# Patient Record
Sex: Male | Born: 1945 | Race: White | Hispanic: No | Marital: Married
Health system: Midwestern US, Community
[De-identification: ages and names within clinical notes are randomized; demographics above are authoritative.]

## PROBLEM LIST (undated history)

## (undated) DIAGNOSIS — E785 Hyperlipidemia, unspecified: Secondary | ICD-10-CM

## (undated) DIAGNOSIS — I428 Other cardiomyopathies: Secondary | ICD-10-CM

## (undated) DIAGNOSIS — I10 Essential (primary) hypertension: Secondary | ICD-10-CM

## (undated) DIAGNOSIS — I2510 Atherosclerotic heart disease of native coronary artery without angina pectoris: Secondary | ICD-10-CM

## (undated) DIAGNOSIS — R55 Syncope and collapse: Secondary | ICD-10-CM

## (undated) DIAGNOSIS — Z95810 Presence of automatic (implantable) cardiac defibrillator: Secondary | ICD-10-CM

## (undated) DIAGNOSIS — T8489XA Other specified complication of internal orthopedic prosthetic devices, implants and grafts, initial encounter: Secondary | ICD-10-CM

## (undated) DIAGNOSIS — M5416 Radiculopathy, lumbar region: Secondary | ICD-10-CM

## (undated) HISTORY — DX: Syncope and collapse: R55

## (undated) HISTORY — DX: Atherosclerotic heart disease of native coronary artery without angina pectoris: I25.10

## (undated) HISTORY — PX: CORONARY ARTERY BYPASS GRAFT: SHX141

## (undated) HISTORY — PX: KNEE ARTHROPLASTY: SHX992

## (undated) HISTORY — DX: Hyperlipidemia, unspecified: E78.5

## (undated) HISTORY — DX: Other cardiomyopathies: I42.8

## (undated) HISTORY — DX: Essential (primary) hypertension: I10

## (undated) HISTORY — DX: Presence of automatic (implantable) cardiac defibrillator: Z95.810

## (undated) HISTORY — PX: CORONARY STENT PLACEMENT: SHX1402

---

## 2005-11-02 ENCOUNTER — Emergency Department (HOSPITAL_COMMUNITY): Admission: EM | Admit: 2005-11-02 | Discharge: 2005-11-02 | Payer: Self-pay | Admitting: Emergency Medicine

## 2007-08-22 IMAGING — CR DG CHEST 2V
2 series · 2 of 2 positions shown · non-contrast
Comparison: none

HISTORY: Syncope, past history cardiac stenting

CHEST 2 VIEWS:
No prior study for comparison
Upper normal heart size.
Normal mediastinal contours and pulmonary vascularity.
Lungs clear.
No pleural effusion or pneumothorax.
Minimal spur formation thoracic spine.

[view not recorded (1 of 2)]
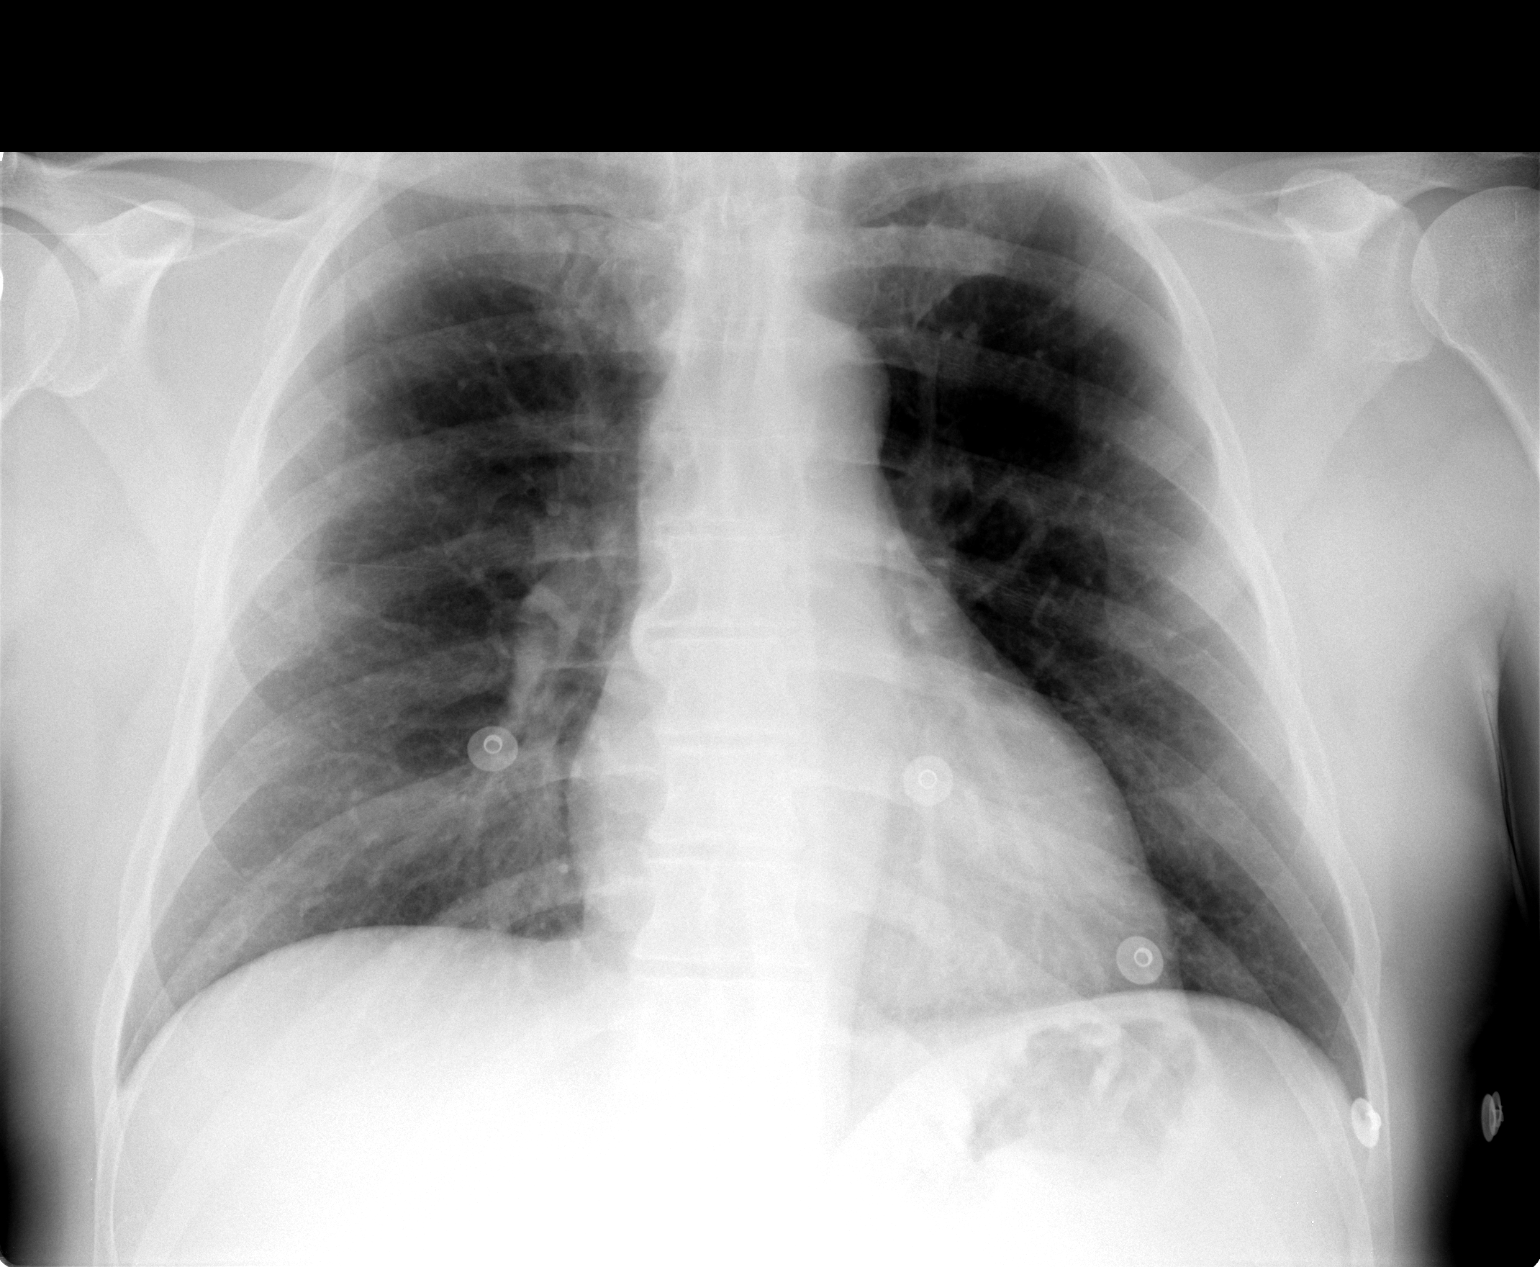

[view not recorded (2 of 2)]
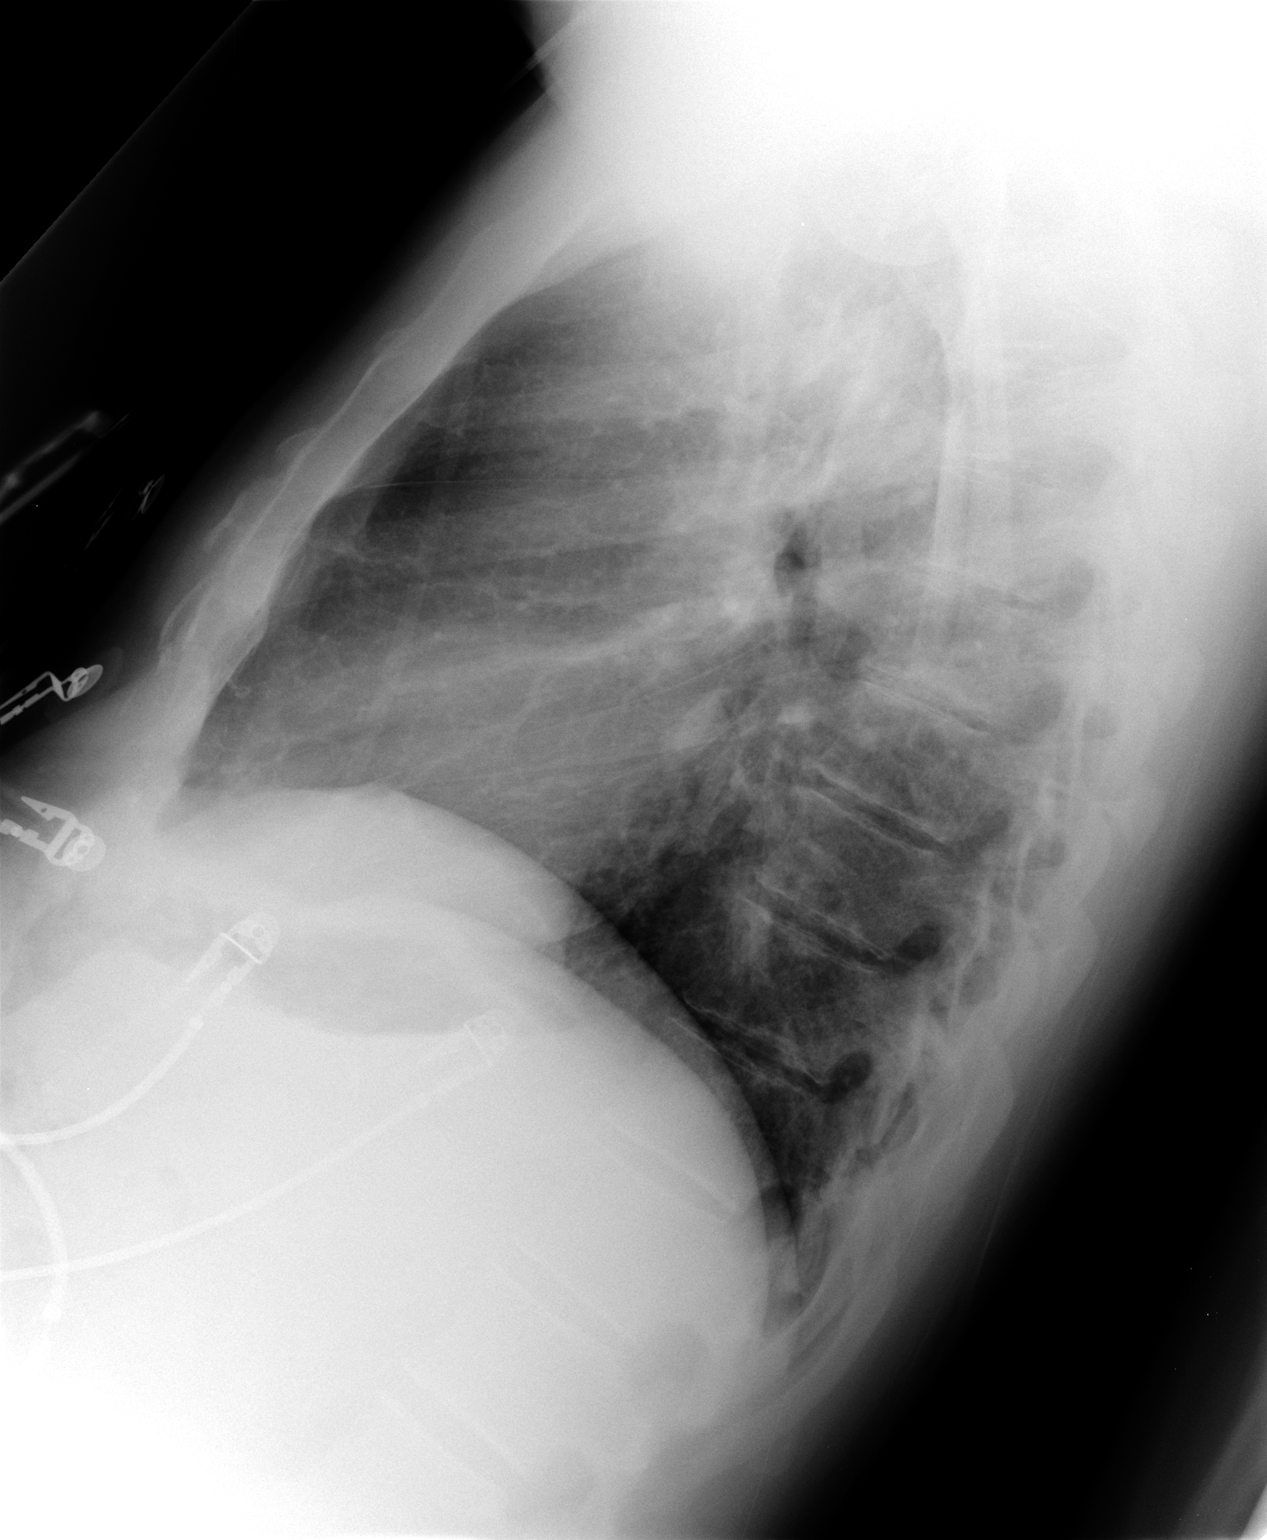

[2 of 2 positions shown; findings below may reference images not displayed]

IMPRESSION: No acute abnormalities.

## 2011-11-14 ENCOUNTER — Encounter: Payer: Self-pay | Admitting: *Deleted

## 2011-11-17 ENCOUNTER — Encounter: Payer: Self-pay | Admitting: *Deleted

## 2011-11-17 ENCOUNTER — Ambulatory Visit (INDEPENDENT_AMBULATORY_CARE_PROVIDER_SITE_OTHER): Payer: Medicare Other | Admitting: Internal Medicine

## 2011-11-17 ENCOUNTER — Encounter: Payer: Self-pay | Admitting: Internal Medicine

## 2011-11-17 VITALS — BP 132/80 | HR 64 | Ht 71.0 in | Wt 202.0 lb

## 2011-11-17 DIAGNOSIS — I2510 Atherosclerotic heart disease of native coronary artery without angina pectoris: Secondary | ICD-10-CM

## 2011-11-17 DIAGNOSIS — R55 Syncope and collapse: Secondary | ICD-10-CM

## 2011-11-17 DIAGNOSIS — Z95810 Presence of automatic (implantable) cardiac defibrillator: Secondary | ICD-10-CM

## 2011-11-17 DIAGNOSIS — I25709 Atherosclerosis of coronary artery bypass graft(s), unspecified, with unspecified angina pectoris: Secondary | ICD-10-CM | POA: Insufficient documentation

## 2011-11-17 DIAGNOSIS — E119 Type 2 diabetes mellitus without complications: Secondary | ICD-10-CM

## 2011-11-17 LAB — ICD DEVICE OBSERVATION
AL THRESHOLD: 0.5 V
BAMS-0003: 70 {beats}/min
LV LEAD IMPEDENCE ICD: 1362.5 Ohm
MODE SWITCH EPISODES: 0
RV LEAD AMPLITUDE: 11.7 mv
RV LEAD THRESHOLD: 0.5 V
TOT-0007: 0
TOT-0008: 0
TOT-0010: 8
VENTRICULAR PACING ICD: 99.82 pct

## 2011-11-17 NOTE — Assessment & Plan Note (Signed)
No recurrent syncope 

## 2011-11-17 NOTE — Assessment & Plan Note (Signed)
Have encouraged him to work on weight loss.

## 2011-11-17 NOTE — Assessment & Plan Note (Signed)
Stable. I've asked him to discuss with Dr. Katrinka Blazing as to whether a lower dose of aspirin might be appropriate

## 2011-11-17 NOTE — Progress Notes (Signed)
CARDIOLOGY CONSULT NOTE  Patient ID: Todd [name], MRN: [id number], DOB/AGE: [birth year] 67 y.o. Admit date: (Not on file) Date of Consult: 11/17/2011  Primary Physician: Katy Apo, MD, MD Primary Cardiologist: HS  Chief Complaint:  New ICD   HPI Todd [name] is a 66 y.o. male : Seen to establish ICD followup.  His story is unusual. He had an episode of syncope in 2002 that was probably vasovagal. He recounts undergoing catheterization at which a 60% lesion was found and a stent was placed. 2007 he underwent stress testing that he was told was "a false positive" subsequently underwent single-vessel CABG because the artery at the site of the stent was "kink" and could not be repaired. In 2010 he underwent CRT implantation, not because he needed to go because he travels. He had left bundle branch block. He does not know his ejection fraction.  He denies exercise intolerance but he does say that he got better following CRT implantation. He's had no recurrent syncope. He has had no ICD discharges.    Past Medical History  Diagnosis Date  . Syncope, vasovagal   . Coronary artery disease   . Diabetes mellitus   . Hyperlipidemia       Surgical History:  Past Surgical History  Procedure Date  . Coronary stent placement      Home Meds: Prior to Admission medications   Medication Sig Start Date End Date Taking? Authorizing Provider  aspirin 325 MG EC tablet Take 325 mg by mouth daily.   Yes Historical Provider, MD  carvedilol (COREG) 25 MG tablet Take 25 mg by mouth 2 (two) times daily with a meal.   Yes Historical Provider, MD  cyanocobalamin 2000 MCG tablet Take 2,000 mcg by mouth daily.   Yes Historical Provider, MD  esomeprazole (NEXIUM) 40 MG capsule Take 40 mg by mouth daily before breakfast.   Yes Historical Provider, MD  fenofibrate (TRICOR) 145 MG tablet Take 145 mg by mouth daily.   Yes Historical Provider, MD  FOLIC ACID PO Take 1 tablet by mouth daily.   Yes  Historical Provider, MD  levothyroxine (SYNTHROID, LEVOTHROID) 50 MCG tablet Take 50 mcg by mouth daily.   Yes Historical Provider, MD  metFORMIN (GLUCOPHAGE) 1000 MG tablet Take 1,000 mg by mouth 2 (two) times daily with a meal.   Yes Historical Provider, MD  Multiple Vitamin (MULTIVITAMIN) capsule Take 1 capsule by mouth daily.   Yes Historical Provider, MD  Niacin-Simvastatin (SIMCOR PO) Take 1 tablet by mouth daily.   Yes Historical Provider, MD  Omega-3 Fatty Acids (FISH OIL PO) Take 1 tablet by mouth daily.   Yes Historical Provider, MD  sitaGLIPtin (JANUVIA) 100 MG tablet Take 100 mg by mouth daily.   Yes Historical Provider, MD  valsartan (DIOVAN) 80 MG tablet Take 80 mg by mouth daily.   Yes Historical Provider, MD     Allergies:  Allergies  Allergen Reactions  . Shellfish Allergy     History   Social History  . Marital Status: Married    Spouse Name: N/A    Number of Children: N/A  . Years of Education: N/A   Occupational History  . Not on file.   Social History Main Topics  . Smoking status: Not on file  . Smokeless tobacco: Not on file  . Alcohol Use: Not on file  . Drug Use: Not on file  . Sexually Active: Not on file   Other Topics Concern  . Not on  file   Social History Narrative  . No narrative on file     No family history on file.   ROS:  Please see the history of present illness.     All other systems reviewed and negative.    Physical Exam:   There were no vitals taken for this visit.  General: Well developed, well nourished male in no acute distress. Head: Normocephalic, atraumatic, sclera non-icteric, no xanthomas, nares are without discharge. Lymph Nodes:  none Neck: Negative for carotid bruits. JVD not elevated. Lungs: Clear bilaterally to auscultation without wheezes, rales, or rhonchi. Breathing is unlabored. Heart: RRR with S1 S2. No murmurs, rubs, or gallops appreciated. Abdomen: Soft, non-tender, non-distended with normoactive  bowel sounds. No hepatomegaly. No rebound/guarding. No obvious abdominal masses. Msk:  Strength and tone appear normal for age. Extremities: No clubbing or cyanosis. No edema.  Distal pedal pulses are 2+ and equal bilaterally. Skin: Warm and Dry Neuro: Alert and oriented X 3. CN III-XII intact Grossly normal sensory and motor function . Psych:  Responds to questions appropriately with a normal affect.      Labs:   Radiology/Studies:  No results found.  EKG: P synchronous pacing   Assessment and Plan:   Sherryl Manges

## 2011-11-17 NOTE — Assessment & Plan Note (Signed)
The patient has an ICD implanted for primary prevention. We'll reprogram the device today to lengthen the intervals to detect for his lung zone device.  It is also noteworthy that his AV delay is programmed at 110 and his intrinsic conduction is at 190. However, in the absence of symptoms, I think it is not necessary to reprogram this.

## 2011-11-17 NOTE — Patient Instructions (Signed)

## 2012-02-19 ENCOUNTER — Encounter: Payer: Medicare Other | Admitting: *Deleted

## 2012-02-24 ENCOUNTER — Encounter: Payer: Self-pay | Admitting: *Deleted

## 2012-04-07 ENCOUNTER — Telehealth: Payer: Self-pay | Admitting: Internal Medicine

## 2012-04-07 NOTE — Telephone Encounter (Signed)
Pt would like to know if we are getting readings from his merlin?

## 2012-04-07 NOTE — Telephone Encounter (Signed)
Spoke with patient, he will attempt to [employer] and transmit tonight.  I will check in the am and call him for an update.

## 2012-04-13 ENCOUNTER — Telehealth: Payer: Self-pay | Admitting: Internal Medicine

## 2012-04-13 NOTE — Telephone Encounter (Signed)
New Problem: ° ° ° ° °Called in to set up his merlin account.  Please call back. °

## 2012-04-13 NOTE — Telephone Encounter (Signed)
Spoke with patient, [employer] is going to come out to his house and check out the line.  The patient will call us when the problem is resolved and we can continue with Merlin transmissions.

## 2012-04-15 ENCOUNTER — Ambulatory Visit (INDEPENDENT_AMBULATORY_CARE_PROVIDER_SITE_OTHER): Payer: Medicare Other | Admitting: *Deleted

## 2012-04-15 ENCOUNTER — Encounter: Payer: Self-pay | Admitting: Internal Medicine

## 2012-04-15 ENCOUNTER — Encounter: Payer: Medicare Other | Admitting: *Deleted

## 2012-04-15 DIAGNOSIS — I428 Other cardiomyopathies: Secondary | ICD-10-CM

## 2012-04-15 DIAGNOSIS — Z95810 Presence of automatic (implantable) cardiac defibrillator: Secondary | ICD-10-CM

## 2012-04-19 LAB — REMOTE ICD DEVICE
AL IMPEDENCE ICD: 490 Ohm
ATRIAL PACING ICD: 2.5 pct
BAMS-0001: 180 {beats}/min
LV LEAD IMPEDENCE ICD: 1275 Ohm
RV LEAD AMPLITUDE: 11.7 mv
RV LEAD IMPEDENCE ICD: 440 Ohm
RV LEAD THRESHOLD: 0.5 V
VENTRICULAR PACING ICD: 100 pct

## 2012-06-15 ENCOUNTER — Encounter: Payer: Self-pay | Admitting: *Deleted

## 2012-07-26 ENCOUNTER — Ambulatory Visit (INDEPENDENT_AMBULATORY_CARE_PROVIDER_SITE_OTHER): Payer: Medicare Other | Admitting: *Deleted

## 2012-07-26 ENCOUNTER — Other Ambulatory Visit: Payer: Self-pay

## 2012-07-26 ENCOUNTER — Encounter: Payer: Self-pay | Admitting: Internal Medicine

## 2012-07-26 DIAGNOSIS — Z95810 Presence of automatic (implantable) cardiac defibrillator: Secondary | ICD-10-CM

## 2012-07-26 DIAGNOSIS — I2510 Atherosclerotic heart disease of native coronary artery without angina pectoris: Secondary | ICD-10-CM

## 2012-07-26 LAB — REMOTE ICD DEVICE
AL THRESHOLD: 0.375 V
BAMS-0003: 70 {beats}/min
HV IMPEDENCE: 47 Ohm
RV LEAD AMPLITUDE: 11.7 mv
RV LEAD IMPEDENCE ICD: 440 Ohm
RV LEAD THRESHOLD: 0.5 V

## 2012-07-28 ENCOUNTER — Telehealth: Payer: Self-pay | Admitting: *Deleted

## 2012-07-28 NOTE — Telephone Encounter (Signed)
Patient came by for appt that had been rescheduled. Routed to [employer].

## 2012-07-28 NOTE — Telephone Encounter (Signed)
Will forward to device clinic. It looks like the patient was supposed to have a remote check on 2/17. I don't know for sure if this was done. Will forward to device to confirm.

## 2012-07-29 NOTE — Telephone Encounter (Signed)
Spoke w/pt and transmission was received on 07-26-12. Pt aware and will wait for letter in mail.

## 2012-08-03 ENCOUNTER — Encounter: Payer: Self-pay | Admitting: *Deleted

## 2012-11-09 ENCOUNTER — Encounter: Payer: Self-pay | Admitting: Internal Medicine

## 2012-11-09 ENCOUNTER — Ambulatory Visit (INDEPENDENT_AMBULATORY_CARE_PROVIDER_SITE_OTHER): Payer: Medicare Other | Admitting: Internal Medicine

## 2012-11-09 VITALS — BP 166/97 | HR 80 | Ht 70.0 in | Wt 203.0 lb

## 2012-11-09 DIAGNOSIS — I428 Other cardiomyopathies: Secondary | ICD-10-CM

## 2012-11-09 DIAGNOSIS — Z95810 Presence of automatic (implantable) cardiac defibrillator: Secondary | ICD-10-CM

## 2012-11-09 DIAGNOSIS — I10 Essential (primary) hypertension: Secondary | ICD-10-CM

## 2012-11-09 DIAGNOSIS — I2510 Atherosclerotic heart disease of native coronary artery without angina pectoris: Secondary | ICD-10-CM

## 2012-11-09 LAB — ICD DEVICE OBSERVATION
AL IMPEDENCE ICD: 475 Ohm
ATRIAL PACING ICD: 2.5 pct
BAMS-0001: 180 {beats}/min
BAMS-0003: 70 {beats}/min
FVT: 0
MODE SWITCH EPISODES: 0
PACEART VT: 0
TOT-0006: 20101005000000
TOT-0007: 0
TOT-0010: 11
VENTRICULAR PACING ICD: 99.56 pct

## 2012-11-09 NOTE — Progress Notes (Signed)
Patient Care Team: Katy Apo, MD as PCP - General (Internal Medicine)   HPI  Todd [name] is a 67 y.o. male Seen in followup for ICD implantation w hx of syncope ? Vasovagal with Cath 60% lesion and stent>>1v CAB 2/2 kinking Ultimately he got CRT  He has some discomfort in his defibrillator site. There is no redness. He is noted more with recent change in his golf swing. He has an echo pending   Past Medical History  Diagnosis Date  . Syncope, vasovagal   . Coronary artery disease     Status post CABG  . Diabetes mellitus   . Hyperlipidemia   . ICD-St.Jude-CRT     Implanted 2010    Past Surgical History  Procedure Laterality Date  . Coronary stent placement      Current Outpatient Prescriptions  Medication Sig Dispense Refill  . aspirin 325 MG EC tablet Take 325 mg by mouth daily.      . carvedilol (COREG) 25 MG tablet Take 25 mg by mouth 2 (two) times daily with a meal.      . cyanocobalamin 2000 MCG tablet Take 2,000 mcg by mouth daily.      Marland Kitchen esomeprazole (NEXIUM) 40 MG capsule Take 40 mg by mouth daily before breakfast.      . fenofibrate (TRICOR) 145 MG tablet Take 145 mg by mouth daily.      Marland Kitchen FOLIC ACID PO Take 1 tablet by mouth daily.      Marland Kitchen levothyroxine (SYNTHROID, LEVOTHROID) 50 MCG tablet Take 50 mcg by mouth daily.      . metFORMIN (GLUCOPHAGE) 1000 MG tablet Take 1,000 mg by mouth 2 (two) times daily with a meal.      . Multiple Vitamin (MULTIVITAMIN) capsule Take 1 capsule by mouth daily.      . Niacin-Simvastatin (SIMCOR PO) Take 1 tablet by mouth daily.      . Omega-3 Fatty Acids (FISH OIL PO) Take 1 tablet by mouth daily.      . sitaGLIPtin (JANUVIA) 100 MG tablet Take 100 mg by mouth daily.      . valsartan (DIOVAN) 80 MG tablet Take 80 mg by mouth daily.       No current facility-administered medications for this visit.    Allergies  Allergen Reactions  . Shellfish Allergy     Review of Systems negative except from HPI and  PMH  Physical Exam BP 166/97  Pulse 80  Ht 5\' 10"  (1.778 m)  Wt 203 lb (92.08 kg)  BMI 29.13 kg/m2  Well developed and well nourished in no acute distress HENT normal E scleral and icterus clear Neck Supple JVP flat; carotids brisk and full Clear to ausculation  Regular rate and rhythm, no murmurs gallops or rub Soft with active bowel sounds No clubbing cyanosis none Edema Alert and oriented, grossly normal motor and sensory function Skin Warm and Dry  .psps P-synchronous/ AV  pacing   Assessment and  Plan

## 2012-11-09 NOTE — Assessment & Plan Note (Signed)
Elevated  Will defer to Dr Luretha Murphy

## 2012-11-09 NOTE — Assessment & Plan Note (Signed)
Will decrease asa to 81 mg

## 2012-11-09 NOTE — Patient Instructions (Addendum)
Remote monitoring is used to monitor your Pacemaker of ICD from home. This monitoring reduces the number of office visits required to check your device to one time per year. It allows [place] to keep an eye on the functioning of your device to ensure it is working properly. You are scheduled for a device check from home on 02/14/13. You may send your transmission at any time that day. If you have a wireless device, the transmission will be sent automatically. After your physician reviews your transmission, you will receive a postcard with your next transmission date.  Your physician wants you to follow-up in: 1 year with Dr. Graciela Husbands. You will receive a reminder letter in the mail two months in advance. If you don't receive a letter, please call our office to schedule the follow-up appointment.  Your physician recommends that you continue on your current medications as directed. Please refer to the Current Medication list given to you today.

## 2012-11-09 NOTE — Assessment & Plan Note (Signed)
The patient's device was interrogated.  The information was reviewed. Output on LV lead were evaluated and reprogrammed

## 2013-02-14 ENCOUNTER — Ambulatory Visit (INDEPENDENT_AMBULATORY_CARE_PROVIDER_SITE_OTHER): Payer: Medicare Other | Admitting: *Deleted

## 2013-02-14 DIAGNOSIS — I428 Other cardiomyopathies: Secondary | ICD-10-CM

## 2013-02-15 LAB — REMOTE ICD DEVICE
AL AMPLITUDE: 2.6 mv
AL IMPEDENCE ICD: 480 Ohm
AL THRESHOLD: 0.375 V
DEVICE MODEL ICD: 594846

## 2013-03-14 ENCOUNTER — Encounter: Payer: Self-pay | Admitting: *Deleted

## 2013-04-18 ENCOUNTER — Encounter: Payer: Self-pay | Admitting: Internal Medicine

## 2013-05-16 ENCOUNTER — Encounter: Payer: Medicare Other | Admitting: *Deleted

## 2013-05-24 ENCOUNTER — Encounter: Payer: Self-pay | Admitting: *Deleted

## 2013-11-21 ENCOUNTER — Encounter: Payer: Self-pay | Admitting: *Deleted

## 2013-11-29 ENCOUNTER — Ambulatory Visit: Payer: Medicare Other | Admitting: Interventional Cardiology

## 2015-01-26 ENCOUNTER — Encounter

## 2015-02-01 NOTE — Progress Notes (Signed)
Interventional Radiology Pre Assessment Phone Call    Pt scheduled for lumbar myelogram in IR on 8/26. Pt to arrive at 715 and check in at [facility] Check in at the 2nd floor radiology desk.    Plan for Sedation:none    Orders: media tab Labs: n/a   H&P: 8/19    Last IR Visit: n/a  NPO:  Instructed to be well hydrated and have light breakfast    Instructed patient to continue previous medications as prescribed prior to procedure except for herbal supplements and vitamins.   Pt to hold ASA 325mg .  Last dose 8/24.     Patient instructed on the following and verbalized understanding:  Responsible adult must drive patient to and from hospital.  Patient verbalized understanding of all instructions and provided all medical/health information to the best of their ability.

## 2015-02-02 ENCOUNTER — Inpatient Hospital Stay: Admit: 2015-02-02 | Payer: MEDICARE | Attending: Orthopaedic Surgery | Primary: Family Medicine

## 2015-02-02 DIAGNOSIS — M5416 Radiculopathy, lumbar region: Secondary | ICD-10-CM

## 2015-02-02 MED ORDER — ONDANSETRON 4 MG TAB, RAPID DISSOLVE
4 mg | ORAL | Status: AC
Start: 2015-02-02 — End: ?

## 2015-02-02 MED ORDER — ONDANSETRON 4 MG TAB, RAPID DISSOLVE
4 mg | Freq: Once | ORAL | Status: AC
Start: 2015-02-02 — End: 2015-02-02
  Administered 2015-02-02: 14:00:00 via ORAL

## 2015-02-02 MED ORDER — SODIUM BICARBONATE 4 % IV
4 % | Freq: Once | INTRAVENOUS | Status: AC
Start: 2015-02-02 — End: 2015-02-02
  Administered 2015-02-02: 13:00:00 via SUBCUTANEOUS

## 2015-02-02 MED ORDER — DIPHENHYDRAMINE 50 MG CAP
50 mg | ORAL | Status: AC
Start: 2015-02-02 — End: ?

## 2015-02-02 MED ORDER — LIDOCAINE HCL 2 % (20 MG/ML) IJ SOLN
20 mg/mL (2 %) | Freq: Once | INTRAMUSCULAR | Status: AC
Start: 2015-02-02 — End: 2015-02-02
  Administered 2015-02-02: 13:00:00 via SUBCUTANEOUS

## 2015-02-02 MED ORDER — SODIUM BICARBONATE 4 % IV
4 % | INTRAVENOUS | Status: AC
Start: 2015-02-02 — End: ?

## 2015-02-02 MED ORDER — LIDOCAINE HCL 2 % (20 MG/ML) IJ SOLN
20 mg/mL (2 %) | INTRAMUSCULAR | Status: AC
Start: 2015-02-02 — End: ?

## 2015-02-02 MED ORDER — DIPHENHYDRAMINE 50 MG CAP
50 mg | Freq: Once | ORAL | Status: AC
Start: 2015-02-02 — End: 2015-02-02
  Administered 2015-02-02: 12:00:00 via ORAL

## 2015-02-02 MED ORDER — IOPAMIDOL 41 % INTRATHECAL
200 mg iodine /mL (41 %) | Freq: Once | INTRATHECAL | Status: AC
Start: 2015-02-02 — End: 2015-02-02
  Administered 2015-02-02: 13:00:00 via INTRASPINAL

## 2015-02-02 MED FILL — DIPHENHYDRAMINE 50 MG CAP: 50 mg | ORAL | Qty: 1

## 2015-02-02 MED FILL — ONDANSETRON 4 MG TAB, RAPID DISSOLVE: 4 mg | ORAL | Qty: 1

## 2015-02-02 MED FILL — NEUT 4 % INTRAVENOUS SOLUTION: 4 % | INTRAVENOUS | Qty: 1

## 2015-02-02 MED FILL — XYLOCAINE 20 MG/ML (2 %) INJECTION SOLUTION: 20 mg/mL (2 %) | INTRAMUSCULAR | Qty: 20

## 2015-02-02 NOTE — Procedures (Signed)
Procedures by Arman Bogusarruthers, Chiffon Kittleson L, PA-C at 02/02/15 [phone]                Author: Arman Bogusarruthers, Ezrie Bunyan L, PA-C  Service: Physician Assistant  Author Type: Physician Assistant       Filed: 02/02/15 0852  Date of Service: 02/02/15 0851  Status: Signed           Editor: Drema Dallasarruthers, Cliff Damiani L, PA-C (Physician Assistant)  Cosigner: Roseanne RenoHarp, Richard J, MD at 02/22/15 1251                    Department of Interventional Radiology   [phone]          Interventional Radiology Brief Procedure Note          Patient: Aaron Floyd  MRN: [id number]   SSN: [id number]          Date of Birth: 06/04/1946   Age: 69 y.o.   Sex: male         Date of Procedure: 02/02/2015      Pre-Procedure Diagnosis: back pain      Post-Procedure Diagnosis: SAME      Procedure(s): Myelogram      Brief Description of Procedure: fluoro guided LP @ L4/5, contrast injected, needle removed      Performed By: Arman BogusKristy L Jamari Moten, PA-C       Assistants: None      Anesthesia:None      Estimated Blood Loss: None      Specimens: None      Implants: None      Findings: See dictated report.       Complications: None      Recommendations: bedrest, CT       Follow Up: Dr Marlyne BeardsJennings         Signed By:  Arman BogusKristy L Elysa Womac, PA-C           February 02, 2015

## 2015-02-02 NOTE — H&P (Signed)
Department of Interventional Radiology  [phone]    History and Physical    Patient:  Aaron Floyd MRN:  [id number]  SSN:  [id number]    Date of Birth:  Jun 15, 1945  Age:  69 y.o.  Sex:  male      Primary Care Provider:  Mariane Baumgarten, MD  Referring Physician:  Kathee Polite., *    Subjective:     Chief Complaint: back pain    History of the Present Illness:  The patient is a 69 y.o. male who presents for lumbar myelogram.  Low back pain radiates LLE to ankle.  Shellfish allergy, no known contrast allergy.        Past Medical History   Diagnosis Date   ??? Diabetes (HCC)    ??? GERD (gastroesophageal reflux disease)    ??? Hypertension    ??? Thyroid disease      hypothyroidism     Past Surgical History   Procedure Laterality Date   ??? Hx pacemaker  2010     st jude /defibrillator/pacemaker   ??? Hx heent  1950     tonsillectomy   ??? Pr cardiac surg procedure unlist  01/22/2007     cabg    ??? Pr cardiac surg procedure unlist  2002     stent- no longer active after CABG        Review of Systems:    Pertinent items are noted in the History of Present Illness.    Current Outpatient Prescriptions   Medication Sig   ??? aspirin delayed-release 325 mg tablet Take  by mouth every six (6) hours as needed for Pain.   ??? sitaGLIPtin-metFORMIN (JANUMET) 50-1,000 mg per tablet Take 1 Tab by mouth two (2) times daily (with meals).   ??? losartan (COZAAR) 100 mg tablet Take 100 mg by mouth daily.   ??? esomeprazole (NEXIUM) 20 mg capsule Take  by mouth daily.   ??? carvedilol (COREG) 25 mg tablet Take 25 mg by mouth two (2) times daily (with meals).   ??? folic acid (FOLVITE) 1 mg tablet Take  by mouth daily.   ??? levothyroxine (SYNTHROID) 50 mcg tablet Take  by mouth Daily (before breakfast).   ??? atorvastatin (LIPITOR) 40 mg tablet Take  by mouth daily.   ??? cholecalciferol, vitamin D3, (VITAMIN D3) 2,000 unit tab Take  by mouth daily.   ??? omega-3 fatty acids-vitamin e 1,000 mg cap Take 1 Cap by mouth.    ??? multivitamin (ONE A DAY) tablet Take 1 Tab by mouth daily.     Current Facility-Administered Medications   Medication Dose Route Frequency   ??? lidocaine (XYLOCAINE) 20 mg/mL (2 %) injection 20-400 mg  20-400 mg SubCUTAneous ONCE   ??? sodium bicarbonate (NEUT) injection 2 mL  2 mL SubCUTAneous ONCE   ??? iopamidol (ISOVUE-M 200) 41 % contrast solution 20 mL  20 mL IntraSPINal RAD ONCE        Allergies   Allergen Reactions   ??? Shellfish Derived Hives     Hives/ tolerates contrast dye without issues       Family History   Problem Relation Age of Onset   ??? Diabetes Mother    ??? Stroke Mother    ??? Heart Disease Mother    ??? Diabetes Father    ??? Heart Disease Father      Social History   Substance Use Topics   ??? Smoking status: Former Smoker     Quit date: 01/31/1965   ???  Smokeless tobacco: Not on file   ??? Alcohol use No        Objective:       Physical Examination:    Vitals:    02/01/15 1131 02/02/15 0759   BP:  160/86   Pulse:  68   Resp:  17   SpO2:  97%   Weight: 88.5 kg (195 lb) 88.5 kg (195 lb)   Height: 5' 10.5" (1.791 m) 5' 10.5" (1.791 m)     Blood pressure 160/86, pulse 68, resp. rate 17, height 5' 10.5" (1.791 m), weight 88.5 kg (195 lb), SpO2 97 %.  HEART: regular rate and rhythm  LUNG: clear to auscultation bilaterally  ABDOMEN: warm, no edema  EXTREMITIES: warm, no edema    Laboratory:     No results found for: NA, K, CL, CO2, AGAP, GLU, BUN, CREA, GFRAA, GFRNA, CA, MG, PHOS, ALB, TBIL, TP, ALB, GLOB, AGRAT, SGOT, ALT, GPT  No results found for: WBC, HGB, HCT, PLT, HGBEXT, HCTEXT, PLTEXT  No results found for: APTT, PTP, INR    Assessment:     Low back pain    Plan:     Planned Procedure:  Lumbar myelogram    Risks, benefits, and alternatives reviewed with patient and he agrees to proceed with the procedure.      Signed By: Arman Bogus, PA-C     February 02, 2015

## 2015-02-02 NOTE — Progress Notes (Signed)
TRANSFER - IN REPORT:    Verbal report received from [facility],RN (name) on Aaron Floyd  being received from IR (unit) for routine progression of care      Report consisted of patient???s Situation, Background, Assessment and   Recommendations(SBAR).     Information from the following report(s) Procedure Summary and MAR was reviewed with the receiving nurse.    Opportunity for questions and clarification was provided.      Assessment completed upon patient???s arrival to unit and care assumed.

## 2015-02-02 NOTE — Progress Notes (Signed)
Discharge complete. Discharge instructions reviewed with pt. Time for questions allowed. Pt denies any questions at this time. Dressing to back noted to be clean, dry, and intact. Pt assisted to front of hospital via wheelchair.     Visit Vitals   ??? BP 159/87 (BP 1 Location: Right arm, BP Patient Position: At rest)   ??? Pulse 63   ??? Resp 16   ??? Ht 5' 10.5" (1.791 m)   ??? Wt 88.5 kg (195 lb)   ??? SpO2 97%   ??? BMI 27.58 kg/m2

## 2015-02-02 NOTE — Progress Notes (Signed)
Upon assessment pt stated he was nauseous. Gaetano Net PA-C made aware. Verbal order received from K. Carruthers PA-C to administer 4 mg of Zofan, PO, once. Will continue to monitor.

## 2015-02-02 NOTE — Procedures (Signed)
Department of Interventional Radiology  [phone]        Interventional Radiology Brief Procedure Note    Patient: Christoper FabianJames Golberg MRN: [id number]  SSN: [id number]    Date of Birth: 12/27/1945  Age: 69 y.o.  Sex: male      Date of Procedure: 02/02/2015    Pre-Procedure Diagnosis: back pain    Post-Procedure Diagnosis: SAME    Procedure(s): Myelogram    Brief Description of Procedure: fluoro guided LP @ L4/5, contrast injected, needle removed    Performed By: Arman BogusKristy L Mykle Pascua, PA-C     Assistants: None    Anesthesia:None    Estimated Blood Loss: None    Specimens: None    Implants: None    Findings: See dictated report.     Complications: None    Recommendations: bedrest, CT     Follow Up: Dr Marlyne BeardsJennings    Signed By: Arman BogusKristy L Chino Sardo, PA-C     February 02, 2015

## 2015-06-01 ENCOUNTER — Telehealth: Payer: Self-pay | Admitting: Internal Medicine

## 2015-06-01 NOTE — Telephone Encounter (Signed)
Spoke w/ pt- pt stated he is seeing w/ Dr Leonard Schwartz in [place], [place]- pt appreciative of our following up with him.

## 2015-06-26 ENCOUNTER — Inpatient Hospital Stay: Payer: MEDICARE | Primary: Family Medicine

## 2015-06-26 ENCOUNTER — Encounter: Payer: MEDICARE | Primary: Family Medicine

## 2015-06-28 ENCOUNTER — Inpatient Hospital Stay: Admit: 2015-06-28 | Payer: MEDICARE | Attending: Orthopaedic Surgery | Primary: Family Medicine

## 2015-06-28 DIAGNOSIS — Z01812 Encounter for preprocedural laboratory examination: Secondary | ICD-10-CM

## 2015-06-28 LAB — CBC WITH AUTOMATED DIFF
ABS. BASOPHILS: 0 10*3/uL (ref 0.0–0.2)
ABS. EOSINOPHILS: 0.1 10*3/uL (ref 0.0–0.8)
ABS. IMM. GRANS.: 0 10*3/uL (ref 0.0–0.5)
ABS. LYMPHOCYTES: 1.1 10*3/uL (ref 0.5–4.6)
ABS. MONOCYTES: 0.3 10*3/uL (ref 0.1–1.3)
ABS. NEUTROPHILS: 3.4 10*3/uL (ref 1.7–8.2)
BASOPHILS: 0 % (ref 0.0–2.0)
EOSINOPHILS: 2 % (ref 0.5–7.8)
HCT: 38.5 % — ABNORMAL LOW (ref 41.1–50.3)
HGB: 13.3 g/dL — ABNORMAL LOW (ref 13.6–17.2)
IMMATURE GRANULOCYTES: 0.4 % (ref 0.0–5.0)
LYMPHOCYTES: 22 % (ref 13–44)
MCH: 29.5 PG (ref 26.1–32.9)
MCHC: 34.5 g/dL (ref 31.4–35.0)
MCV: 85.4 FL (ref 79.6–97.8)
MONOCYTES: 7 % (ref 4.0–12.0)
MPV: 9.7 FL — ABNORMAL LOW (ref 10.8–14.1)
NEUTROPHILS: 69 % (ref 43–78)
PLATELET: 153 10*3/uL (ref 150–450)
RBC: 4.51 M/uL (ref 4.23–5.67)
RDW: 13.2 % (ref 11.9–14.6)
WBC: 5 10*3/uL (ref 4.3–11.1)

## 2015-06-28 LAB — METABOLIC PANEL, BASIC
Anion gap: 12 mmol/L (ref 7–16)
BUN: 23 MG/DL (ref 8–23)
CO2: 25 mmol/L (ref 21–32)
Calcium: 9.3 MG/DL (ref 8.3–10.4)
Chloride: 102 mmol/L (ref 98–107)
Creatinine: 1.03 MG/DL (ref 0.8–1.5)
GFR est AA: >60 mL/min/{1.73_m2} (ref >60)
GFR est non-AA: >60 mL/min/{1.73_m2} (ref >60)
Glucose: 248 mg/dL — ABNORMAL HIGH (ref 65–100)
Potassium: 4 mmol/L (ref 3.5–5.1)
Sodium: 139 mmol/L (ref 136–145)

## 2015-06-28 LAB — URINALYSIS W/ RFLX MICROSCOPIC
Bilirubin: NEGATIVE
Blood: NEGATIVE
Glucose: >1000 mg/dL
Leukocyte Esterase: NEGATIVE
Nitrites: NEGATIVE
Protein: NEGATIVE mg/dL
Specific gravity: 1.031 — ABNORMAL HIGH (ref 1.001–1.023)
Urobilinogen: 0.2 EU/dL (ref 0.2–1.0)
pH (UA): 5 (ref 5.0–9.0)

## 2015-06-28 LAB — PTT: aPTT: 25.4 s (ref 25.3–32.9)

## 2015-06-28 LAB — PROTHROMBIN TIME + INR
INR: 1 (ref 0.9–1.2)
Prothrombin time: 10.8 s (ref 9.6–12.0)

## 2015-06-28 LAB — MSSA/MRSA SC BY PCR, NASAL SWAB

## 2015-06-28 NOTE — Other (Signed)
Lab results are within limits per anesthesia protocol.

## 2015-06-28 NOTE — Undefined (Signed)
Total Joint Surgery Preoperative Chart Review      Patient ID:  Aaron Floyd  [id number]  70 y.o.  09/06/1945  Surgeon: Dr. Marlyne Beards  Date of Surgery: 07/02/2015  Procedure: Total Left Knee Arthroplasty  Primary Care Physician: Mariane Baumgarten, MD [phone]  Specialty Physician(s):  [facility]    Subjective:   Aaron Floyd is a 70 y.o. WHITE OR CAUCASIAN male who presents for preoperative evaluation for Total Left Knee arthroplasty.    This is a preoperative chart review note based on data collected by the nurse at the surgical Pre-Assessment visit.    Past Medical History   Diagnosis Date   ??? Arrhythmia      pacemaker   ??? CAD (coronary artery disease)      CABG, stent   ??? Diabetes (HCC)      pt reports most recent  A1C 6.6   ??? GERD (gastroesophageal reflux disease)    ??? Hypertension    ??? Ischemic cardiomyopathy      hx per cardiologist note 05-11-15   ??? Sepsis [facility])      June 2016   ??? Sleep apnea      c-pap   ??? Thyroid disease      hypothyroidism      Past Surgical History   Procedure Laterality Date   ??? Pr cardiac surg procedure unlist  01/22/2007     cabg    ??? Pr cardiac surg procedure unlist  2002     stent- no longer active after CABG   ??? Hx pacemaker  2010     st jude /defibrillator/pacemaker   ??? Hx heent  1950     tonsillectomy     Family History   Problem Relation Age of Onset   ??? Diabetes Mother    ??? Stroke Mother    ??? Heart Disease Mother    ??? Diabetes Father    ??? Heart Disease Father       Social History   Substance Use Topics   ??? Smoking status: Former Smoker     Quit date: 01/31/1965   ??? Smokeless tobacco: Not on file   ??? Alcohol use No       Prior to Admission medications    Medication Sig Start Date End Date Taking? Authorizing Provider   aspirin delayed-release 81 mg tablet Take 81 mg by mouth daily. Take morning of surgery per anesthesia guidelines.   Yes Historical Provider   aspirin delayed-release 325 mg tablet Take  by mouth every six (6) hours  as needed for Pain. Stop 5 days prior to surgery per anesthesia guidelines.    Historical Provider   sitaGLIPtin-metFORMIN (JANUMET) 50-1,000 mg per tablet Take 1 Tab by mouth two (2) times daily (with meals).    Historical Provider   losartan (COZAAR) 100 mg tablet Take 100 mg by mouth daily.    Historical Provider   esomeprazole (NEXIUM) 20 mg capsule Take  by mouth daily. Take morning of surgery per anesthesia guidelines.    Historical Provider   carvedilol (COREG) 25 mg tablet Take 25 mg by mouth two (2) times daily (with meals). Take morning of surgery per anesthesia guidelines.    Historical Provider   folic acid (FOLVITE) 1 mg tablet Take  by mouth daily.    Historical Provider   levothyroxine (SYNTHROID) 50 mcg tablet Take  by mouth Daily (before breakfast). Take morning of surgery per anesthesia guidelines.    Historical Provider   atorvastatin (LIPITOR) 40  mg tablet Take  by mouth nightly.    Historical Provider   cholecalciferol, vitamin D3, (VITAMIN D3) 2,000 unit tab Take  by mouth daily.    Historical Provider   omega-3 fatty acids-vitamin e 1,000 mg cap Take 1 Cap by mouth.    Historical Provider   multivitamin (ONE A DAY) tablet Take 1 Tab by mouth daily.    Historical Provider     Allergies   Allergen Reactions   ??? Shellfish Derived Hives     Hives/ tolerates contrast dye without issues          Objective:     Physical Exam:   Patient Vitals for the past 24 hrs:   Temp Pulse Resp BP SpO2   06/28/15 0845 97.1 ??F (36.2 ??C) 67 18 160/79 98 %       ECG:    EKG Results     None          Data Review:   Labs:   Recent Results (from the past 24 hour(s))   CBC WITH AUTOMATED DIFF    Collection Time: 06/28/15  9:30 AM   Result Value Ref Range    WBC 5.0 4.3 - 11.1 K/uL    RBC 4.51 4.23 - 5.67 M/uL    HGB 13.3 (L) 13.6 - 17.2 g/dL    HCT 16.1 (L) 09.6 - 50.3 %    MCV 85.4 79.6 - 97.8 FL    MCH 29.5 26.1 - 32.9 PG    MCHC 34.5 31.4 - 35.0 g/dL    RDW 04.5 40.9 - 81.1 %    PLATELET 153 150 - 450 K/uL     MPV 9.7 (L) 10.8 - 14.1 FL    DF AUTOMATED      NEUTROPHILS 69 43 - 78 %    LYMPHOCYTES 22 13 - 44 %    MONOCYTES 7 4.0 - 12.0 %    EOSINOPHILS 2 0.5 - 7.8 %    BASOPHILS 0 0.0 - 2.0 %    IMMATURE GRANULOCYTES 0.4 0.0 - 5.0 %    ABS. NEUTROPHILS 3.4 1.7 - 8.2 K/UL    ABS. LYMPHOCYTES 1.1 0.5 - 4.6 K/UL    ABS. MONOCYTES 0.3 0.1 - 1.3 K/UL    ABS. EOSINOPHILS 0.1 0.0 - 0.8 K/UL    ABS. BASOPHILS 0.0 0.0 - 0.2 K/UL    ABS. IMM. GRANS. 0.0 0.0 - 0.5 K/UL   METABOLIC PANEL, BASIC    Collection Time: 06/28/15  9:30 AM   Result Value Ref Range    Sodium 139 136 - 145 mmol/L    Potassium 4.0 3.5 - 5.1 mmol/L    Chloride 102 98 - 107 mmol/L    CO2 25 21 - 32 mmol/L    Anion gap 12 7 - 16 mmol/L    Glucose 248 (H) 65 - 100 mg/dL    BUN 23 8 - 23 MG/DL    Creatinine 9.14 0.8 - 1.5 MG/DL    GFR est AA >78 >29 FA/OZH/0.86V7    GFR est non-AA >60 >60 ml/min/1.39m2    Calcium 9.3 8.3 - 10.4 MG/DL         Total Joint Surgery Pre-Assessment Recommendations:          The patient is to wear home CPAP during hospitalization.        Signed By: Robie Ridge, NP-C    June 28, 2015

## 2015-06-28 NOTE — Other (Addendum)
Patient verified name, DOB, and surgery as listed in [facility].    Problem chart:  1-pt has echo scheduled with [facility] 06-29-15 at 8:00.  2 cardiology clearance requested from Dr Orson Aloe by surgeon's office  3-requested cardiology records from [facility] recent ekg and pacemaker interrogation; other records if available-stress, echo,,cath.  Cardiologist office notes printed from EMR.  4-anesthesiologist to review all cardiology records    Type 3 surgery, Joint assessment complete.    Labs per surgeon: cbc,bmp,pt,ptt,ua,mssa ; results pending.    [id number] recent ekg - 04-26-15- from cardiologist.    Hibiclens and instructions given per hospital policy.    Nasal Swab collected per MD order and instructions for Mupirocin nasal ointment if required.    Patient provided with handouts including Guide to Surgery, Pain Management, Hand Hygiene, Blood Transfusion Education, and Chief Financial Officer.    Patient answered medical/surgical history questions at their best of ability. All prior to admission medications documented in [facility]. Original medication prescription bottle not visualized during patient appointment.       Patient instructed to hold all vitamins 7 days prior to surgery and NSAIDS 5 days prior to surgery. Medications to be held prior to surgery : aspirin 325 mg - 5 days.    Patient instructed to continue previous medications as prescribed prior to surgery and to take the following medications the day of surgery according to anesthesia guidelines with a small sip of water: coreg, aspirin 81 mg, nexium,synthroid. Patient instructed to bring c-pap to hospital.  Patient given I.S. and instructions for use.   Patient taught back and verbalized understanding.

## 2015-06-28 NOTE — Other (Signed)
Notified [employer] representative of pt surgery with date and location- left voice message for Tammy Sours at [phone].

## 2015-06-28 NOTE — Other (Signed)
Recent Results (from the past 12 hour(s))   CBC WITH AUTOMATED DIFF    Collection Time: 06/28/15  9:30 AM   Result Value Ref Range    WBC 5.0 4.3 - 11.1 K/uL    RBC 4.51 4.23 - 5.67 M/uL    HGB 13.3 (L) 13.6 - 17.2 g/dL    HCT 16.1 (L) 09.6 - 50.3 %    MCV 85.4 79.6 - 97.8 FL    MCH 29.5 26.1 - 32.9 PG    MCHC 34.5 31.4 - 35.0 g/dL    RDW 04.5 40.9 - 81.1 %    PLATELET 153 150 - 450 K/uL    MPV 9.7 (L) 10.8 - 14.1 FL    DF AUTOMATED      NEUTROPHILS 69 43 - 78 %    LYMPHOCYTES 22 13 - 44 %    MONOCYTES 7 4.0 - 12.0 %    EOSINOPHILS 2 0.5 - 7.8 %    BASOPHILS 0 0.0 - 2.0 %    IMMATURE GRANULOCYTES 0.4 0.0 - 5.0 %    ABS. NEUTROPHILS 3.4 1.7 - 8.2 K/UL    ABS. LYMPHOCYTES 1.1 0.5 - 4.6 K/UL    ABS. MONOCYTES 0.3 0.1 - 1.3 K/UL    ABS. EOSINOPHILS 0.1 0.0 - 0.8 K/UL    ABS. BASOPHILS 0.0 0.0 - 0.2 K/UL    ABS. IMM. GRANS. 0.0 0.0 - 0.5 K/UL   METABOLIC PANEL, BASIC    Collection Time: 06/28/15  9:30 AM   Result Value Ref Range    Sodium 139 136 - 145 mmol/L    Potassium 4.0 3.5 - 5.1 mmol/L    Chloride 102 98 - 107 mmol/L    CO2 25 21 - 32 mmol/L    Anion gap 12 7 - 16 mmol/L    Glucose 248 (H) 65 - 100 mg/dL    BUN 23 8 - 23 MG/DL    Creatinine 9.14 0.8 - 1.5 MG/DL    GFR est AA >78 >29 FA/OZH/0.86V7    GFR est non-AA >60 >60 ml/min/1.65m2    Calcium 9.3 8.3 - 10.4 MG/DL   PROTHROMBIN TIME + INR    Collection Time: 06/28/15  9:30 AM   Result Value Ref Range    Prothrombin time 10.8 9.6 - 12.0 sec    INR 1.0 0.9 - 1.2     PTT    Collection Time: 06/28/15  9:30 AM   Result Value Ref Range    aPTT 25.4 25.3 - 32.9 SEC   URINALYSIS W/ RFLX MICROSCOPIC    Collection Time: 06/28/15  9:30 AM   Result Value Ref Range    Color YELLOW      Appearance CLEAR      Specific gravity 1.031 (H) 1.001 - 1.023      pH (UA) 5.0 5.0 - 9.0      Protein NEGATIVE  NEG mg/dL    Glucose >8469 mg/dL    Ketone TRACE (A) NEG mg/dL    Bilirubin NEGATIVE  NEG      Blood NEGATIVE  NEG      Urobilinogen 0.2 0.2 - 1.0 EU/dL     Nitrites NEGATIVE  NEG      Leukocyte Esterase NEGATIVE  NEG

## 2015-06-29 NOTE — Other (Addendum)
Received records from [facility] and placed on chart for anesthesia reference:    1- EKG (04/26/2015)  2- office note (05/11/2015)  3- ECHO report (06/29/2015)- EF 55-65%  4- Pacer interrogation (10/27/2014)    Also on chart, Dr Horatio Pel office note with ICD function check (04/26/2015).    Chart sent to 3rd ortho for surgery 07/02/15.

## 2015-06-29 NOTE — Other (Signed)
Pt had ECHO this AM- report in care everywhere and printed for anesthesia reference/review.     Call to Sam with [facility] [phone] for verbal request for records including EKG- Sam states he faxed records over 10 min ago- no faxes received at this time but will continue to wait.

## 2015-06-29 NOTE — Other (Signed)
Open chart to review to ensure pt cleared for surgery

## 2015-06-29 NOTE — Other (Signed)
MRSA nasal swab results reviewed. No further action required.    06/28/2015 11:32 AM - Edi, Lab In [facility]   ??   Component Results    ?? Component Value Flag Ref Range Units Status   ?? Special Requests: NASAL   ?? Final   ?? Culture result:    ?? Final   ?? SA target not detected. ?? ?? ?? ?? ?? ?? ?? ?? ?? ?? ?? ?? ?? ?? ?? ?? A MRSA NEGATIVE, SA NEGATIVE test result does not preclude MRSA or SA nasal colonization.

## 2015-07-02 ENCOUNTER — Inpatient Hospital Stay
Admit: 2015-07-02 | Discharge: 2015-07-04 | Disposition: A | Discharge status: Home Health | Payer: MEDICARE | Source: Ambulatory Visit | Attending: Orthopaedic Surgery | Admitting: Orthopaedic Surgery

## 2015-07-02 DIAGNOSIS — M1712 Unilateral primary osteoarthritis, left knee: Secondary | ICD-10-CM

## 2015-07-02 LAB — TYPE & SCREEN
ABO/Rh(D): A POS
Antibody screen: NEGATIVE

## 2015-07-02 LAB — TYPE AND SCREEN
ABO/Rh: A POS
Antibody Screen: NEGATIVE

## 2015-07-02 LAB — GLUCOSE, POC
Glucose (POC): 205 mg/dL — ABNORMAL HIGH (ref 65–100)
Glucose (POC): 246 mg/dL — ABNORMAL HIGH (ref 65–100)

## 2015-07-02 MED ORDER — BUPIVACAINE-DEXTROSE-WATER(PF) 7.5 MG/ML (0.75 %) (SPINAL) IJ SOLN
0.75 % (7.5 mg/mL) | INTRAMUSCULAR | Status: DC | PRN
Start: 2015-07-02 — End: 2015-07-02
  Administered 2015-07-02: 17:00:00 via INTRATHECAL

## 2015-07-02 MED ORDER — NALOXONE 0.4 MG/ML INJECTION
0.4 mg/mL | INTRAMUSCULAR | Status: DC | PRN
Start: 2015-07-02 — End: 2015-07-04

## 2015-07-02 MED ORDER — ONDANSETRON (PF) 4 MG/2 ML INJECTION
4 mg/2 mL | INTRAMUSCULAR | Status: AC
Start: 2015-07-02 — End: ?

## 2015-07-02 MED ORDER — KETOROLAC TROMETHAMINE 30 MG/ML INJECTION
30 mg/mL (1 mL) | INTRAMUSCULAR | Status: AC
Start: 2015-07-02 — End: ?

## 2015-07-02 MED ORDER — LACTATED RINGERS BOLUS IV
Freq: Once | INTRAVENOUS | Status: AC
Start: 2015-07-02 — End: 2015-07-02
  Administered 2015-07-02: 15:00:00 via INTRAVENOUS

## 2015-07-02 MED ORDER — SITAGLIPTIN-METFORMIN 50 MG-1,000 MG TAB
50-1000 mg | Freq: Two times a day (BID) | ORAL | Status: DC
Start: 2015-07-02 — End: 2015-07-02

## 2015-07-02 MED ORDER — MORPHINE 10 MG/ML IJ SOLN
10 mg/mL | INTRAMUSCULAR | Status: AC
Start: 2015-07-02 — End: ?

## 2015-07-02 MED ORDER — FENTANYL CITRATE (PF) 50 MCG/ML IJ SOLN
50 mcg/mL | INTRAMUSCULAR | Status: AC
Start: 2015-07-02 — End: ?

## 2015-07-02 MED ORDER — HYDROCODONE-ACETAMINOPHEN 5 MG-325 MG TAB
5-325 mg | ORAL | Status: DC | PRN
Start: 2015-07-02 — End: 2015-07-02

## 2015-07-02 MED ORDER — ASPIRIN 325 MG TAB, DELAYED RELEASE
325 mg | Freq: Two times a day (BID) | ORAL | Status: DC
Start: 2015-07-02 — End: 2015-07-04
  Administered 2015-07-03 – 2015-07-04 (×5): via ORAL

## 2015-07-02 MED ORDER — OXYCODONE ER 10 MG TABLET,CRUSH RESISTANT,EXTENDED RELEASE 12 HR
10 mg | Freq: Two times a day (BID) | ORAL | Status: DC
Start: 2015-07-02 — End: 2015-07-03

## 2015-07-02 MED ORDER — INSULIN LISPRO 100 UNIT/ML INJECTION
100 unit/mL | Freq: Two times a day (BID) | SUBCUTANEOUS | Status: DC
Start: 2015-07-02 — End: 2015-07-03
  Administered 2015-07-03 (×2): via SUBCUTANEOUS

## 2015-07-02 MED ORDER — KETOROLAC TROMETHAMINE 30 MG/ML INJECTION
30 mg/mL (1 mL) | INTRAMUSCULAR | Status: DC | PRN
Start: 2015-07-02 — End: 2015-07-02
  Administered 2015-07-02: 18:00:00 via INTRAVENOUS

## 2015-07-02 MED ORDER — HYDROMORPHONE 2 MG TAB
2 mg | ORAL | Status: DC | PRN
Start: 2015-07-02 — End: 2015-07-03
  Administered 2015-07-03 (×4): via ORAL

## 2015-07-02 MED ORDER — CELECOXIB 200 MG CAP
200 mg | Freq: Two times a day (BID) | ORAL | Status: DC
Start: 2015-07-02 — End: 2015-07-04
  Administered 2015-07-03 – 2015-07-04 (×4): via ORAL

## 2015-07-02 MED ORDER — LOSARTAN 50 MG TAB
50 mg | Freq: Every day | ORAL | Status: DC
Start: 2015-07-02 — End: 2015-07-04
  Administered 2015-07-03 – 2015-07-04 (×2): via ORAL

## 2015-07-02 MED ORDER — ONDANSETRON (PF) 4 MG/2 ML INJECTION
4 mg/2 mL | INTRAMUSCULAR | Status: DC | PRN
Start: 2015-07-02 — End: 2015-07-04
  Administered 2015-07-02 – 2015-07-03 (×2): via INTRAVENOUS

## 2015-07-02 MED ORDER — FAMOTIDINE 20 MG TAB
20 mg | Freq: Once | ORAL | Status: DC
Start: 2015-07-02 — End: 2015-07-02

## 2015-07-02 MED ORDER — CARVEDILOL 25 MG TAB
25 mg | Freq: Two times a day (BID) | ORAL | Status: DC
Start: 2015-07-02 — End: 2015-07-04
  Administered 2015-07-02 – 2015-07-04 (×4): via ORAL

## 2015-07-02 MED ORDER — PANTOPRAZOLE 40 MG TAB, DELAYED RELEASE
40 mg | Freq: Every day | ORAL | Status: DC
Start: 2015-07-02 — End: 2015-07-04
  Administered 2015-07-03 – 2015-07-04 (×4): via ORAL

## 2015-07-02 MED ORDER — TRANEXAMIC ACID 1,000 MG/10 ML (100 MG/ML) IV
1000 mg/10 mL (100 mg/mL) | INTRAVENOUS | Status: AC
Start: 2015-07-02 — End: ?

## 2015-07-02 MED ORDER — MORPHINE 10 MG/ML INJ SOLUTION
10 mg/ml | INTRAMUSCULAR | Status: DC | PRN
Start: 2015-07-02 — End: 2015-07-02
  Administered 2015-07-02: 18:00:00 via INTRAVENOUS

## 2015-07-02 MED ORDER — NEOMYCIN-POLYMYXIN B GU 40 MG-200,000 UNIT/ML IRRIGATION SOLN
40 mg-200,000 unit/mL | Status: DC | PRN
Start: 2015-07-02 — End: 2015-07-02
  Administered 2015-07-02: 18:00:00

## 2015-07-02 MED ORDER — NEOMYCIN-POLYMYXIN B GU 40 MG-200,000 UNIT/ML IRRIGATION SOLN
40 mg-200,000 unit/mL | Status: AC
Start: 2015-07-02 — End: ?

## 2015-07-02 MED ORDER — INSULIN REGULAR HUMAN 100 UNIT/ML INJECTION
100 unit/mL | Freq: Once | INTRAMUSCULAR | Status: AC
Start: 2015-07-02 — End: 2015-07-02
  Administered 2015-07-02: 16:00:00 via SUBCUTANEOUS

## 2015-07-02 MED ORDER — DEXAMETHASONE SODIUM PHOSPHATE 10 MG/ML IJ SOLN
10 mg/mL | INTRAMUSCULAR | Status: AC
Start: 2015-07-02 — End: ?

## 2015-07-02 MED ORDER — SODIUM CHLORIDE 0.9 % IJ SYRG
Freq: Three times a day (TID) | INTRAMUSCULAR | Status: DC
Start: 2015-07-02 — End: 2015-07-04
  Administered 2015-07-02 – 2015-07-04 (×6): via INTRAVENOUS

## 2015-07-02 MED ORDER — PROPOFOL 10 MG/ML IV EMUL
10 mg/mL | INTRAVENOUS | Status: AC
Start: 2015-07-02 — End: ?

## 2015-07-02 MED ORDER — SENNOSIDES-DOCUSATE SODIUM 8.6 MG-50 MG TAB
Freq: Every day | ORAL | Status: DC
Start: 2015-07-02 — End: 2015-07-04
  Administered 2015-07-04: 14:00:00 via ORAL

## 2015-07-02 MED ORDER — TUBERCULIN PPD 5 UNIT/0.1 ML INTRADERMAL
5 tub. unit /0.1 mL | Freq: Once | INTRADERMAL | Status: DC
Start: 2015-07-02 — End: 2015-07-02

## 2015-07-02 MED ORDER — FENTANYL CITRATE (PF) 50 MCG/ML IJ SOLN
50 mcg/mL | INTRAMUSCULAR | Status: DC | PRN
Start: 2015-07-02 — End: 2015-07-02
  Administered 2015-07-02 (×2): via INTRAVENOUS

## 2015-07-02 MED ORDER — ROPIVACAINE 2 MG/ML INJECTION
2 mg/mL (0. %) | INTRAMUSCULAR | Status: AC
Start: 2015-07-02 — End: ?

## 2015-07-02 MED ORDER — LEVOTHYROXINE 50 MCG TAB
50 mcg | Freq: Every day | ORAL | Status: DC
Start: 2015-07-02 — End: 2015-07-04
  Administered 2015-07-03 – 2015-07-04 (×4): via ORAL

## 2015-07-02 MED ORDER — SITAGLIPTIN 50 MG TAB
50 mg | Freq: Two times a day (BID) | ORAL | Status: DC
Start: 2015-07-02 — End: 2015-07-04
  Administered 2015-07-02 – 2015-07-04 (×4): via ORAL

## 2015-07-02 MED ORDER — TRANEXAMIC ACID 1,000 MG/10 ML (100 MG/ML) IV
1000 mg/10 mL (100 mg/mL) | INTRAVENOUS | Status: DC | PRN
Start: 2015-07-02 — End: 2015-07-02
  Administered 2015-07-02: 17:00:00 via INTRAVENOUS

## 2015-07-02 MED ORDER — TUBERCULIN PPD 5 UNIT/0.1 ML INTRADERMAL
5 tub. unit /0.1 mL | Freq: Once | INTRADERMAL | Status: AC
Start: 2015-07-02 — End: 2015-07-02
  Administered 2015-07-02: 15:00:00 via INTRADERMAL

## 2015-07-02 MED ORDER — HYDROMORPHONE (PF) 1 MG/ML IJ SOLN
1 mg/mL | INTRAMUSCULAR | Status: DC | PRN
Start: 2015-07-02 — End: 2015-07-03

## 2015-07-02 MED ORDER — DIPHENHYDRAMINE 25 MG CAP
25 mg | ORAL | Status: DC | PRN
Start: 2015-07-02 — End: 2015-07-04

## 2015-07-02 MED ORDER — MIDAZOLAM 1 MG/ML IJ SOLN
1 mg/mL | Freq: Once | INTRAMUSCULAR | Status: DC | PRN
Start: 2015-07-02 — End: 2015-07-02

## 2015-07-02 MED ORDER — FENTANYL CITRATE (PF) 50 MCG/ML IJ SOLN
50 mcg/mL | Freq: Once | INTRAMUSCULAR | Status: AC
Start: 2015-07-02 — End: 2015-07-02
  Administered 2015-07-02: 17:00:00 via INTRAVENOUS

## 2015-07-02 MED ORDER — ROPIVACAINE 2 MG/ML INJECTION
2 mg/mL (0. %) | INTRAMUSCULAR | Status: DC | PRN
Start: 2015-07-02 — End: 2015-07-02
  Administered 2015-07-02: 17:00:00 via PERINEURAL

## 2015-07-02 MED ORDER — ACETAMINOPHEN 500 MG TAB
500 mg | Freq: Four times a day (QID) | ORAL | Status: DC
Start: 2015-07-02 — End: 2015-07-04
  Administered 2015-07-03 – 2015-07-04 (×6): via ORAL

## 2015-07-02 MED ORDER — SODIUM CHLORIDE 0.9 % IV
INTRAVENOUS | Status: AC
Start: 2015-07-02 — End: 2015-07-04
  Administered 2015-07-02 – 2015-07-03 (×2): via INTRAVENOUS

## 2015-07-02 MED ORDER — MIDAZOLAM 1 MG/ML IJ SOLN
1 mg/mL | INTRAMUSCULAR | Status: DC | PRN
Start: 2015-07-02 — End: 2015-07-02
  Administered 2015-07-02: 17:00:00 via INTRAVENOUS

## 2015-07-02 MED ORDER — MIDAZOLAM 1 MG/ML IJ SOLN
1 mg/mL | Freq: Once | INTRAMUSCULAR | Status: AC
Start: 2015-07-02 — End: 2015-07-02
  Administered 2015-07-02: 17:00:00 via INTRAVENOUS

## 2015-07-02 MED ORDER — LACTATED RINGERS IV
INTRAVENOUS | Status: DC
Start: 2015-07-02 — End: 2015-07-02
  Administered 2015-07-02 (×3): via INTRAVENOUS

## 2015-07-02 MED ORDER — ACETAMINOPHEN 1,000 MG/100 ML (10 MG/ML) IV
1000 mg/100 mL (10 mg/mL) | Freq: Once | INTRAVENOUS | Status: AC
Start: 2015-07-02 — End: 2015-07-02
  Administered 2015-07-02: 22:00:00 via INTRAVENOUS

## 2015-07-02 MED ORDER — CEFAZOLIN 2 GRAM/50 ML NS IVPB
Freq: Three times a day (TID) | INTRAVENOUS | Status: AC
Start: 2015-07-02 — End: 2015-07-03
  Administered 2015-07-03 (×2): via INTRAVENOUS

## 2015-07-02 MED ORDER — PHENYLEPHRINE 10 MG/ML INJECTION
10 mg/mL | INTRAMUSCULAR | Status: AC
Start: 2015-07-02 — End: ?

## 2015-07-02 MED ORDER — SODIUM CHLORIDE 0.9 % IJ SYRG
INTRAMUSCULAR | Status: DC | PRN
Start: 2015-07-02 — End: 2015-07-04

## 2015-07-02 MED ORDER — OXYCODONE 5 MG TAB
5 mg | Freq: Once | ORAL | Status: DC | PRN
Start: 2015-07-02 — End: 2015-07-02

## 2015-07-02 MED ORDER — DEXAMETHASONE SODIUM PHOSPHATE 4 MG/ML IJ SOLN
4 mg/mL | INTRAMUSCULAR | Status: DC | PRN
Start: 2015-07-02 — End: 2015-07-02
  Administered 2015-07-02: 17:00:00 via INTRAVENOUS

## 2015-07-02 MED ORDER — MIDAZOLAM 1 MG/ML IJ SOLN
1 mg/mL | INTRAMUSCULAR | Status: AC
Start: 2015-07-02 — End: ?

## 2015-07-02 MED ORDER — ONDANSETRON (PF) 4 MG/2 ML INJECTION
4 mg/2 mL | INTRAMUSCULAR | Status: DC | PRN
Start: 2015-07-02 — End: 2015-07-02
  Administered 2015-07-02: 17:00:00 via INTRAVENOUS

## 2015-07-02 MED ORDER — EPHEDRINE SULFATE 50 MG/ML IJ SOLN
50 mg/mL | INTRAMUSCULAR | Status: AC
Start: 2015-07-02 — End: ?

## 2015-07-02 MED ORDER — PROPOFOL 10 MG/ML IV EMUL
10 mg/mL | INTRAVENOUS | Status: DC | PRN
Start: 2015-07-02 — End: 2015-07-02
  Administered 2015-07-02: 17:00:00 via INTRAVENOUS

## 2015-07-02 MED ORDER — CEFAZOLIN 2 GRAM/50 ML NS IVPB
Freq: Once | INTRAVENOUS | Status: AC
Start: 2015-07-02 — End: 2015-07-02
  Administered 2015-07-02: 17:00:00 via INTRAVENOUS

## 2015-07-02 MED ORDER — ROPIVACAINE 2 MG/ML INJECTION
2 mg/mL (0. %) | INTRAMUSCULAR | Status: DC | PRN
Start: 2015-07-02 — End: 2015-07-02
  Administered 2015-07-02: 18:00:00

## 2015-07-02 MED ORDER — LACTATED RINGERS IV
INTRAVENOUS | Status: DC
Start: 2015-07-02 — End: 2015-07-02
  Administered 2015-07-02: 19:00:00 via INTRAVENOUS

## 2015-07-02 MED ORDER — DEXAMETHASONE SODIUM PHOSPHATE 10 MG/ML IJ SOLN
10 mg/mL | Freq: Once | INTRAMUSCULAR | Status: AC
Start: 2015-07-02 — End: 2015-07-04
  Administered 2015-07-03: 18:00:00 via INTRAVENOUS

## 2015-07-02 MED ORDER — HYDROMORPHONE (PF) 2 MG/ML IJ SOLN
2 mg/mL | INTRAMUSCULAR | Status: DC | PRN
Start: 2015-07-02 — End: 2015-07-02

## 2015-07-02 MED ORDER — BUPIVACAINE-DEXTROSE-WATER(PF) 7.5 MG/ML (0.75 %) (SPINAL) IJ SOLN
0.75 % (7.5 mg/mL) | INTRAMUSCULAR | Status: AC
Start: 2015-07-02 — End: ?

## 2015-07-02 MED ORDER — LIDOCAINE HCL 1 % (10 MG/ML) IJ SOLN
10 mg/mL (1 %) | INTRAMUSCULAR | Status: DC | PRN
Start: 2015-07-02 — End: 2015-07-02
  Administered 2015-07-02: 15:00:00 via SUBCUTANEOUS

## 2015-07-02 MED FILL — MARCAINE SPINAL (PF) 0.75 % (7.5 MG/ML) INJECTION SOLUTION: 0.75 % (7.5 mg/mL) | INTRAMUSCULAR | Qty: 1.8

## 2015-07-02 MED FILL — CARVEDILOL 25 MG TAB: 25 mg | ORAL | Qty: 1

## 2015-07-02 MED FILL — NAROPIN (PF) 2 MG/ML (0.2 %) INJECTION SOLUTION: 2 mg/mL (0. %) | INTRAMUSCULAR | Qty: 60

## 2015-07-02 MED FILL — CYKLOKAPRON 1,000 MG/10 ML (100 MG/ML) INTRAVENOUS SOLUTION: 1000 mg/10 mL (100 mg/mL) | INTRAVENOUS | Qty: 1

## 2015-07-02 MED FILL — DEXAMETHASONE SODIUM PHOSPHATE 10 MG/ML IJ SOLN: 10 mg/mL | INTRAMUSCULAR | Qty: 1

## 2015-07-02 MED FILL — LACTATED RINGERS IV: INTRAVENOUS | Qty: 1000

## 2015-07-02 MED FILL — EPHEDRINE SULFATE 50 MG/ML IJ SOLN: 50 mg/mL | INTRAMUSCULAR | Qty: 1

## 2015-07-02 MED FILL — PROPOFOL 10 MG/ML IV EMUL: 10 mg/mL | INTRAVENOUS | Qty: 711.48

## 2015-07-02 MED FILL — MIDAZOLAM 1 MG/ML IJ SOLN: 1 mg/mL | INTRAMUSCULAR | Qty: 2

## 2015-07-02 MED FILL — CYKLOKAPRON 1,000 MG/10 ML (100 MG/ML) INTRAVENOUS SOLUTION: 1000 mg/10 mL (100 mg/mL) | INTRAVENOUS | Qty: 10

## 2015-07-02 MED FILL — FAMOTIDINE 20 MG TAB: 20 mg | ORAL | Qty: 1

## 2015-07-02 MED FILL — FENTANYL CITRATE (PF) 50 MCG/ML IJ SOLN: 50 mcg/mL | INTRAMUSCULAR | Qty: 2

## 2015-07-02 MED FILL — KETOROLAC TROMETHAMINE 30 MG/ML INJECTION: 30 mg/mL (1 mL) | INTRAMUSCULAR | Qty: 1

## 2015-07-02 MED FILL — OFIRMEV 1,000 MG/100 ML (10 MG/ML) INTRAVENOUS SOLUTION: 1000 mg/100 mL (10 mg/mL) | INTRAVENOUS | Qty: 100

## 2015-07-02 MED FILL — NEOMYCIN-POLYMYXIN B GU 40 MG-200,000 UNIT/ML IRRIGATION SOLN: 40 mg-200,000 unit/mL | Qty: 3

## 2015-07-02 MED FILL — JANUVIA 50 MG TABLET: 50 mg | ORAL | Qty: 1

## 2015-07-02 MED FILL — PHENYLEPHRINE 10 MG/ML INJECTION: 10 mg/mL | INTRAMUSCULAR | Qty: 1

## 2015-07-02 MED FILL — PROPOFOL 10 MG/ML IV EMUL: 10 mg/mL | INTRAVENOUS | Qty: 20

## 2015-07-02 MED FILL — PROPOFOL 10 MG/ML IV EMUL: 10 mg/mL | INTRAVENOUS | Qty: 40

## 2015-07-02 MED FILL — MIDAZOLAM 1 MG/ML IJ SOLN: 1 mg/mL | INTRAMUSCULAR | Qty: 5

## 2015-07-02 MED FILL — ONDANSETRON (PF) 4 MG/2 ML INJECTION: 4 mg/2 mL | INTRAMUSCULAR | Qty: 4

## 2015-07-02 MED FILL — MORPHINE 10 MG/ML IJ SOLN: 10 mg/mL | INTRAMUSCULAR | Qty: 1

## 2015-07-02 MED FILL — SODIUM CHLORIDE 0.9 % IV: INTRAVENOUS | Qty: 1000

## 2015-07-02 MED FILL — ONDANSETRON (PF) 4 MG/2 ML INJECTION: 4 mg/2 mL | INTRAMUSCULAR | Qty: 2

## 2015-07-02 MED FILL — CEFAZOLIN 2 GRAM/50 ML NS IVPB: INTRAVENOUS | Qty: 50

## 2015-07-02 MED FILL — NAROPIN (PF) 2 MG/ML (0.2 %) INJECTION SOLUTION: 2 mg/mL (0. %) | INTRAMUSCULAR | Qty: 30

## 2015-07-02 MED FILL — TUBERSOL 5 TUB. UNIT/0.1 ML INTRADERMAL INJECTION SOLUTION: 5 tub. unit /0.1 mL | INTRADERMAL | Qty: 1

## 2015-07-02 MED FILL — MARCAINE SPINAL (PF) 0.75 % (7.5 MG/ML) INJECTION SOLUTION: 0.75 % (7.5 mg/mL) | INTRAMUSCULAR | Qty: 2

## 2015-07-02 NOTE — Other (Signed)
Aaron Floyd, wife, is here with patient this AM. Best phone number to reach her at is [phone].

## 2015-07-02 NOTE — Anesthesia Pre-Procedure Evaluation (Addendum)
Anesthetic History   No history of anesthetic complications            Review of Systems / Medical History  Patient summary reviewed and pertinent labs reviewed    Pulmonary        Sleep apnea: CPAP           Neuro/Psych   Within defined limits           Cardiovascular    Hypertension: well controlled          Pacemaker, CAD, cardiac stents (1 DES prior to CABG) and CABG    Exercise tolerance: >4 METS  Comments: EF 57% on Echo 06-29-15,  AICD on discharges   GI/Hepatic/Renal     GERD: well controlled           Endo/Other    Diabetes: poorly controlled, type 2  Hypothyroidism: well controlled       Other Findings            Physical Exam    Airway  Mallampati: III  TM Distance: < 4 cm  Neck ROM: normal range of motion   Mouth opening: Normal     Cardiovascular  Regular rate and rhythm,  S1 and S2 normal,  no murmur, click, rub, or gallop             Dental  No notable dental hx       Pulmonary  Breath sounds clear to auscultation               Abdominal  GI exam deferred       Other Findings            Anesthetic Plan    ASA: 4  Anesthesia type: spinal      Post-op pain plan if not by surgeon: peripheral nerve block single      Anesthetic plan and risks discussed with: Patient

## 2015-07-02 NOTE — H&P (Signed)
[facility]  Pre Operative History and Physical Exam    Patient ID:  Aaron Floyd  [id number]  69 y.o.  [birth year]    Today: July 02, 2015       Assessment:   1. Arthritis of the left knee      Plan:    1. 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 The patient was evaluated and examined during a consultation prior to this office visit.  There have been no changes to the patient's orthopedic condition since the initial consultation. The patient has failed previous conservative treatment for this condition including antiinflammatories , and lifestyle modifications. The necessity for joint replacement is present. The patient will be admitted the day of surgery for Procedure(s) (LRB):  LEFT KNEE ARTHROPLASTY TOTAL/ STRYKER/ FNB (Left)      Past Medical/Surgical History:  Past Medical History   Diagnosis Date   ??? Arrhythmia      pacemaker   ??? CAD (coronary artery disease)      CABG, stent   ??? Diabetes (HCC)      pt reports most recent  A1C 6.6   ??? GERD (gastroesophageal reflux disease)    ??? Hypertension    ??? Ischemic cardiomyopathy      hx per cardiologist note 05-11-15   ??? Sepsis [facility])      June 2016   ??? Sleep apnea      c-pap   ??? Thyroid disease      hypothyroidism     Past Surgical History   Procedure Laterality Date   ??? Pr cardiac surg procedure unlist  01/22/2007     cabg    ??? Pr cardiac surg procedure unlist  2002     stent- no longer active after CABG   ??? Hx pacemaker  2010     st jude /defibrillator/pacemaker   ??? Hx heent  1950     tonsillectomy        Allergies:   Allergies   Allergen Reactions   ??? Shellfish Derived Hives     Hives/ tolerates contrast dye without issues        Objective:                    HEENT: NC/AT                   Lungs:  Unlabored respirations, clear breath sounds                    Heart:   RRR                     Abdomen: soft                   Extremities:  Pain with ROM of the left knee    Meds:   Current Facility-Administered Medications   Medication Dose Route Frequency   ??? tuberculin injection 5 Units  5 Units IntraDERMal ONCE   ??? ceFAZolin in 0.9% NS (ANCEF) IVPB soln 2 g  2 g IntraVENous ONCE   ??? lidocaine (XYLOCAINE) 10 mg/mL (1 %) injection 0.1 mL  0.1 mL SubCUTAneous PRN   ??? lactated ringers infusion  75 mL/hr IntraVENous CONTINUOUS   ??? lactated  ringers bolus infusion   IntraVENous ONCE   ??? famotidine (PEPCID) tablet 20 mg  20 mg Oral ONCE   ??? fentaNYL citrate (PF) injection 100 mcg  100 mcg IntraVENous ONCE   ??? midazolam (VERSED) injection 2 mg  2 mg IntraVENous ONCE PRN   ??? midazolam (VERSED) injection 5 mg  5 mg IntraVENous ONCE         Labs:  Hospital Outpatient Visit on 06/28/2015   Component Date Value Ref Range Status   ??? WBC 06/28/2015 5.0  4.3 - 11.1 K/uL Final   ??? RBC 06/28/2015 4.51  4.23 - 5.67 M/uL Final   ??? HGB 06/28/2015 13.3* 13.6 - 17.2 g/dL Final   ??? HCT 16/03/9603 38.5* 41.1 - 50.3 % Final   ??? MCV 06/28/2015 85.4  79.6 - 97.8 FL Final   ??? MCH 06/28/2015 29.5  26.1 - 32.9 PG Final   ??? MCHC 06/28/2015 34.5  31.4 - 35.0 g/dL Final   ??? RDW 54/02/8118 13.2  11.9 - 14.6 % Final   ??? PLATELET 06/28/2015 153  150 - 450 K/uL Final   ??? MPV 06/28/2015 9.7* 10.8 - 14.1 FL Final   ??? DF 06/28/2015 AUTOMATED    Final   ??? NEUTROPHILS 06/28/2015 69  43 - 78 % Final   ??? LYMPHOCYTES 06/28/2015 22  13 - 44 % Final   ??? MONOCYTES 06/28/2015 7  4.0 - 12.0 % Final   ??? EOSINOPHILS 06/28/2015 2  0.5 - 7.8 % Final   ??? BASOPHILS 06/28/2015 0  0.0 - 2.0 % Final   ??? IMMATURE GRANULOCYTES 06/28/2015 0.4  0.0 - 5.0 % Final   ??? ABS. NEUTROPHILS 06/28/2015 3.4  1.7 - 8.2 K/UL Final   ??? ABS. LYMPHOCYTES 06/28/2015 1.1  0.5 - 4.6 K/UL Final   ??? ABS. MONOCYTES 06/28/2015 0.3  0.1 - 1.3 K/UL Final   ??? ABS. EOSINOPHILS 06/28/2015 0.1  0.0 - 0.8 K/UL Final   ??? ABS. BASOPHILS 06/28/2015 0.0  0.0 - 0.2 K/UL Final    ??? ABS. IMM. GRANS. 06/28/2015 0.0  0.0 - 0.5 K/UL Final   ??? Sodium 06/28/2015 139  136 - 145 mmol/L Final   ??? Potassium 06/28/2015 4.0  3.5 - 5.1 mmol/L Final   ??? Chloride 06/28/2015 102  98 - 107 mmol/L Final   ??? CO2 06/28/2015 25  21 - 32 mmol/L Final   ??? Anion gap 06/28/2015 12  7 - 16 mmol/L Final   ??? Glucose 06/28/2015 248* 65 - 100 mg/dL Final    Comment: 47 - 60 mg/dl Consistent with, but not fully diagnostic of hypoglycemia.  101 - 125 mg/dl Impaired fasting glucose/consistent with pre-diabetes mellitus  > 126 mg/dl Fasting glucose consistent with overt diabetes mellitus     ??? BUN 06/28/2015 23  8 - 23 MG/DL Final   ??? Creatinine 06/28/2015 1.03  0.8 - 1.5 MG/DL Final   ??? GFR est AA 06/28/2015 >60  >60 ml/min/1.36m2 Final   ??? GFR est non-AA 06/28/2015 >60  >60 ml/min/1.56m2 Final    Comment: (NOTE)  Estimated GFR is calculated using the Modification of Diet in Renal   Disease (MDRD) Study equation, reported for both African Americans   (GFRAA) and non-African Americans (GFRNA), and normalized to 1.65m2   body surface area. The physician must decide which value applies to   the patient. The MDRD study equation should only be used in   individuals age 69 or older. It has not been  validated for the   following: pregnant women, patients with serious comorbid conditions,   or on certain medications, or persons with extremes of body size,   muscle mass, or nutritional status.     ??? Calcium 06/28/2015 9.3  8.3 - 10.4 MG/DL Final   ??? Special Requests: 06/28/2015 NASAL    Final   ??? Culture result: 06/28/2015 SA target not detected.                                 A MRSA NEGATIVE, SA NEGATIVE test result does not preclude MRSA or SA nasal colonization.    Final   ??? Prothrombin time 06/28/2015 10.8  9.6 - 12.0 sec Final   ??? INR 06/28/2015 1.0  0.9 - 1.2   Final    Comment: Suggested therapeutic INR range:  Venous thrombosis and embolus  2.0-3.0  Prosthetic heart valve         2.5-3.5      ??? aPTT 06/28/2015 25.4  25.3 - 32.9 SEC Final    Heparin Therapeutic Range = 56.6-81.7 secs   ??? Color 06/28/2015 YELLOW    Final   ??? Appearance 06/28/2015 CLEAR    Final   ??? Specific gravity 06/28/2015 1.031* 1.001 - 1.023   Final   ??? pH (UA) 06/28/2015 5.0  5.0 - 9.0   Final   ??? Protein 06/28/2015 NEGATIVE   NEG mg/dL Final   ??? Glucose 16/03/9603 >1000  mg/dL Final   ??? Ketone 06/28/2015 TRACE* NEG mg/dL Final   ??? Bilirubin 54/02/8118 NEGATIVE   NEG   Final   ??? Blood 06/28/2015 NEGATIVE   NEG   Final   ??? Urobilinogen 06/28/2015 0.2  0.2 - 1.0 EU/dL Final   ??? Nitrites 14/78/2956 NEGATIVE   NEG   Final   ??? Leukocyte Esterase 06/28/2015 NEGATIVE   NEG   Final                 There is no problem list on file for this patient.        Signed By: Deberah Castle, PA  July 02, 2015

## 2015-07-02 NOTE — Anesthesia Procedure Notes (Signed)
Peripheral Block    Start time: 07/02/2015 11:39 AM  End time: 07/02/2015 11:41 AM  Performed by: HORTON JR, Gavynn Duvall H  Authorized by: HORTON JR, Aeva Posey H       Pre-procedure:   Indications: at surgeon's request, post-op pain management and procedure for pain    Preanesthetic Checklist: patient identified, risks and benefits discussed, site marked, timeout performed, anesthesia consent given and patient being monitored    Timeout Time: 11:38          Block Type:   Block Type:  Adductor canal  Laterality:  Left  Monitoring:  Standard ASA monitoring, responsive to questions, continuous pulse ox, oxygen, frequent vital sign checks and heart rate  Injection Technique:  Single shot  Procedures: ultrasound guided    Patient Position: prone  Prep: chlorhexidine    Location:  Mid thigh  Needle Type:  Stimuplex  Needle Gauge:  22 G  Needle Localization:  Ultrasound guidance  Medication Injected:  0.2%  Adds:  Epi 1:200K  Volume (mL):  30    Assessment:  Number of attempts:  1  Injection Assessment:  Incremental injection every 5 mL, no paresthesia, ultrasound image on chart, local visualized surrounding nerve on ultrasound, negative aspiration for blood and no intravascular symptoms  Patient tolerance:  Patient tolerated the procedure well with no immediate complications

## 2015-07-02 NOTE — Progress Notes (Signed)
07/02/15 2129   Oxygen Therapy   O2 Sat (%) 98 %   Pulse via Oximetry 74 beats per minute   O2 Device Nasal cannula   O2 Flow Rate (L/min) 2 l/min     Set up patient's home CPAP. Advised him to put it on when he is ready to sleep. No oxygen will be bled in.

## 2015-07-02 NOTE — Progress Notes (Signed)
Problem: Mobility Impaired (Adult and Pediatric)  Goal: *Acute Goals and Plan of Care (Insert Text)  GOALS (1-4 days):  (1.)Aaron Floyd will move from supine to sit and sit to supine in bed with SUPERVISION.  (2.)Aaron Floyd will transfer from bed to chair and chair to bed with STAND BY ASSIST using the least restrictive device.  (3.)Aaron Floyd will ambulate with STAND BY ASSIST for 200 feet with the least restrictive device.  (4.)Aaron Floyd will ambulate up/down 3 steps with bilateral railing with CONTACT GUARD ASSIST with no device.  (5.)Aaron Floyd will increase left knee ROM to 5??-80??.  ________________________________________________________________________________________________      PHYSICAL THERAPY JOINT CAMP TKA: INITIAL ASSESSMENT, TREATMENT DAY: DAY OF ASSESSMENT, PM 07/02/2015  INPATIENT: Hospital Day: 1  [place] / Product Type: Medicare /      NAME/AGE/GENDER: Aaron Floyd is a 70 y.o. male  PRIMARY DIAGNOSIS:  Primary osteoarthritis of left knee               Procedure(s) and Anesthesia Type:     * LEFT KNEE ARTHROPLASTY TOTAL/ STRYKER/ FNB - Spinal (Left)  ICD-10: Treatment Diagnosis:        ?? Pain in Left Knee (M25.562)  ?? Stiffness of Left Knee, Not elsewhere classified (R60.454)  ?? Difficulty in walking, Not elsewhere classified (R26.2)       ASSESSMENT:      Aaron Floyd presents with impaired strength & mobility s/p left TKA. Pt also had decreased stability during out of bed activity. Pt will benefit from follow up therapy to help restore safe function prior to returning home with caregiver.      This section established at most recent assessment   PROBLEM LIST (Impairments causing functional limitations):  1. Decreased Strength  2. Decreased ADL/Functional Activities  3. Decreased Transfer Abilities  4. Decreased Ambulation Ability/Technique  5. Decreased Balance  6. Increased Pain  7. Decreased Activity Tolerance   8. Decreased Flexibility/Joint Mobility  9. Decreased Independence with Home Exercise Program    INTERVENTIONS PLANNED: (Benefits and precautions of physical therapy have been discussed with the patient.)  1. Bed Mobility  2. Gait Training  3. Home Exercise Program (HEP)  4. Therapeutic Exercise/Strengthening  5. Transfer Training  6. Range of Motion: active/assisted/passive  7. Therapeutic Activities      TREATMENT PLAN: Frequency/Duration: Follow patient BID   to address above goals.   Rehabilitation Potential For Stated Goals: GOOD      RECOMMENDED REHABILITATION/EQUIPMENT: (at time of discharge pending progress): Continue Skilled Therapy and Home Health: Physical Therapy.                   HISTORY:   History of Present Injury/Illness (Reason for Referral):  The patient has end stage arthritis of the left knee. The patient was evaluated and examined during a consultation prior to this office visit. There have been no changes to the patient's orthopedic condition since the initial consultation. The patient has failed previous conservative treatment for this condition including antiinflammatories , and lifestyle modifications. The necessity for joint replacement is present. 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 He also has no past medical history of Adverse effect of anesthesia; Difficult intubation; Malignant hyperthermia due to anesthesia; Nausea & vomiting; or Pseudocholinesterase deficiency.  Aaron Floyd  has a past surgical history that includes cardiac surg procedure unlist (01/22/2007); cardiac  surg procedure unlist (2002); pacemaker (2010); and heent (1950).  Social History/Living Environment:   Home Environment: Private residence  # Steps to Enter: 0  One/Two Story Residence: One story  Living Alone: Yes  Support Systems: Family member(s), Spouse/Significant Other/Partner  Patient Expects to be Discharged to:: Private residence  Current DME Used/Available at Home: None  Prior Level of Function/Work/Activity:  Pt was independent without an assistive device prior to this admission   Number of Personal Factors/Comorbidities that affect the Plan of Care: 0: LOW COMPLEXITY   EXAMINATION:   Most Recent Physical Functioning:   Gross Assessment: Yes  Gross Assessment  AROM: Within functional limits (right LE)  Strength: Within functional limits (right LE)            LLE PROM  L Knee Flexion: 60 (~post op)  L Knee Extension: -10 (~post op)           Bed Mobility  Supine to Sit: Minimum assistance  Sit to Supine: Minimum assistance  Scooting: Contact guard assistance     Transfers  Sit to Stand: Minimum assistance;Assist x2  Stand to Sit: Minimum assistance;Assist x2  Bed to Chair:  (NT)     Balance  Sitting: Intact;Without support  Standing: Impaired;With support (walker)                Weight Bearing Status  Left Side Weight Bearing: As tolerated  Distance (ft): 4 Feet (ft)  Ambulation - Level of Assistance: Minimal assistance;Assist x2  Assistive Device: Walker, rolling  Speed/Cadence: Shuffled;Slow  Step Length: Left shortened;Right shortened  Stance: Left decreased  Gait Abnormalities: Antalgic;Decreased step clearance         Braces/Orthotics: none     Left Knee Cold  Type: Cryocuff       Body Structures Involved:  1. Heart  2. Lungs  3. Metabolic  4. Endocrine  5. Bones  6. Joints  7. Muscles  8. Ligaments Body Functions Affected:  1. Sensory/Pain  2. Cardio  3. Respiratory  4. Movement Related  5. Metobolic/Endocrine Activities and Participation Affected:  1. Learning and Applying Knowledge   2. General Tasks and Demands  3. Mobility   Number of elements that affect the Plan of Care: 1-2: LOW COMPLEXITY   CLINICAL PRESENTATION:   Presentation: Stable and uncomplicated: LOW COMPLEXITY   CLINICAL DECISION MAKING:   [employer] AM-PAC??? ???6 Clicks???   Basic Mobility Inpatient Short Form  How much difficulty does the patient currently have... Unable A Lot A Little None   1.  Turning over in bed (including adjusting bedclothes, sheets and blankets)?    1    2    3    4   2.  Sitting down on and standing up from a chair with arms ( e.g., wheelchair, bedside commode, etc.)    1    2    3    4   3.  Moving from lying on back to sitting on the side of the bed?    1    2    3    4   How much help from another person does the patient currently need.Marland KitchenMarland Kitchen  Total A Lot A Little None   4.  Moving to and from a bed to a chair (including a wheelchair)?    1    2    3    4   5.  Need to walk in hospital room?    1    2    3    4   6.  Climbing 3-5 steps with a railing?    1    2    3    4   ?? 2007, Trustees of [address], under license to [place], [place]. All rights reserved       Score:  Initial: 16 Most Recent: X (Date: -- )     Interpretation of Tool:  Represents activities that are increasingly more difficult (i.e. Bed mobility, Transfers, Gait).       Score 24 23 22-20 19-15 14-10 9-7 6       Modifier CH CI CJ CK CL CM CN         ?? Mobility - Walking and Moving Around:              [id number] - CURRENT STATUS:    CK - 40%-59% impaired, limited or restricted              G8979 - GOAL STATUS:           CJ - 20%-39% impaired, limited or restricted              J1914 - D/C STATUS:                       ---------------To be determined---------------  [place] / Product Type: Medicare /       Medical Necessity:     ?? Patient is expected to demonstrate progress in strength, range of  motion, balance, coordination and functional technique to decrease assistance required with bed mobility, transfers & gait.  Reason for Services/Other Comments:  ?? Patient continues to require skilled intervention due to pt not independent with functional mobility.   Use of outcome tool(s) and clinical judgement create a POC that gives a: Clear prediction of patient's progress: LOW COMPLEXITY                 TREATMENT:   (In addition to Assessment/Re-Assessment sessions the following treatments were rendered)      Pre-treatment Symptoms/Complaints:  Mild nausea  Pain: Initial: visual scale  Pain Intensity 1: 2  Pain Location 1: Knee  Pain Orientation 1: Left  Pain Intervention(s) 1: Cold pack, Repositioned  Post Session:  2/10      Assessment/Reassessment only, no treatment provided today        Date:    Date:    Date:      ACTIVITY/EXERCISE AM PM AM PM AM PM   GROUP THERAPY               Ankle Pumps               Quad Sets               Gluteal Sets               Hip ABd/ADduction               Straight Leg Raises  Knee Slides               Short Arc Quads               Long Arc Quads               Chair Slides                               B = bilateral; AA = active assistive; A = active; P = passive       Treatment/Session Assessment:         Response to Treatment:  Tolerated well.     Education:   Home Exercises   Fall Precautions   Hip Precautions  Going Home Video   Knee/Hip Prosthesis Review   Walker Management/Safety  Adaptive Equipment as Needed         Interdisciplinary Collaboration:   ?? Registered Nurse  ?? Rehabilitation Attendant     After treatment position/precautions:   ?? Supine in bed  ?? Bed/Chair-wheels locked  ?? Bed in low position  ?? Caregiver at bedside  ?? Call light within reach  ?? RN notified  ?? Family at bedside     Compliance with Program/Exercises: Will assess as treatment progresses.      Recommendations/Intent for next treatment session:  Treatment next visit will focus on increasing Aaron Floyd's independence with bed mobility, transfers, gait training, strength/ROM exercises, modalities for pain, and patient education.       Total Treatment Duration:  PT Patient Time In/Time Out  Time In: 1640  Time Out: 1655     Maryan Puls, PT

## 2015-07-02 NOTE — Other (Signed)
2% Chlorhexidine Gluconate skin prep applied to left leg(s) per guidelines.

## 2015-07-02 NOTE — Progress Notes (Signed)
Neurovascular and peripheral vascular checks are WDL to BLE. Instructed not to ambulate without assistance from staff. Pt verbalized understanding. Call light in reach.

## 2015-07-02 NOTE — Other (Signed)
TRANSFER - OUT REPORT:    Verbal report given to receiving nurse Loyce(name) on Christoper Fabian  being transferred to 334(unit) for routine progression of care       Report consisted of patient???s Situation, Background, Assessment and   Recommendations(SBAR).     Information from the following report(s) OR Summary, Procedure Summary, Intake/Output and MAR was reviewed with the receiving nurse.    Opportunity for questions and clarification was provided.      Patient transported with:   O2 @ 2 liters  Tech

## 2015-07-02 NOTE — Progress Notes (Signed)
07/02/15 1623   Oxygen Therapy   O2 Sat (%) 99 %   Pulse via Oximetry 69 beats per minute   O2 Device Nasal cannula   O2 Flow Rate (L/min) 2 l/min    Pt working on IS. Pt encouraged to do 10 breaths per hour while awake on IS. Good NPC. No respiratory distress noted at this time.  No complications noted at this time.

## 2015-07-02 NOTE — Anesthesia Post-Procedure Evaluation (Signed)
Post-Anesthesia Evaluation and Assessment    Patient: Aaron Floyd MRN: [id number]  SSN: [id number]    Date of Birth: 02/03/1946  Age: 70 y.o.  Sex: male       Cardiovascular Function/Vital Signs  Visit Vitals   ??? BP (!) 177/97   ??? Pulse 67   ??? Temp 37.1 ??C (98.8 ??F)   ??? Resp 15   ??? Ht 5' 10.5" (1.791 m)   ??? Wt 92.4 kg (203 lb 11.3 oz)   ??? SpO2 97%   ??? BMI 28.82 kg/m2       Patient is status post spinal anesthesia for Procedure(s):  LEFT KNEE ARTHROPLASTY TOTAL/ STRYKER/ FNB.    Nausea/Vomiting: None    Postoperative hydration reviewed and adequate.    Pain:  Pain Scale 1: Numeric (0 - 10) (07/02/15 1426)  Pain Intensity 1: 0 (07/02/15 1426)   Managed    Neurological Status:   Neuro (WDL): Within Defined Limits (07/02/15 1441)  Neuro  Neurologic State: Alert (07/02/15 1441)  Orientation Level: Oriented X4 (07/02/15 1441)  Speech: Clear (07/02/15 1441)  LUE Motor Response: Purposeful (07/02/15 1441)  LLE Motor Response: Pharmacologically paralyzed (07/02/15 1441)  RUE Motor Response: Purposeful (07/02/15 1441)  RLE Motor Response: Pharmacologically paralyzed (07/02/15 1441)   At baseline    Mental Status and Level of Consciousness: Arousable    Pulmonary Status:   O2 Device: Nasal cannula (07/02/15 1356)   Adequate oxygenation and airway patent    Complications related to anesthesia: None    Post-anesthesia assessment completed. No concerns    Signed By: Derry Lory., MD     July 02, 2015

## 2015-07-02 NOTE — Other (Signed)
Blood Sugar 205

## 2015-07-02 NOTE — Progress Notes (Signed)
Patient is A&Ox4. Able to verbalize needs. Resting quietly with no distress noted. Dressing to surgical site is dry and intact. Neurovascular and peripheral vascular checks WNL. Foley draining clear yellow urine to bag. Denies needs. Bed low and locked. Call light within reach. Instructed to call for assistance. Patient verbalizes understanding. Will monitor.

## 2015-07-02 NOTE — Progress Notes (Signed)
Assessment completed. Pt alert and oriented X4. BLE are pharmaceutically paralyzed. Pedal pulses are palpable.  No signs of distress noted. Oriented pt to room, unit, dining on call, IS, and pain management. Pt on 1 liter oxygen via nasal cannula. Lungs clear to auscultation. Bowel sounds present. Foley to drainage with no loops in tubing, and stat lock in place. Yellow/straw color urine noted. IV intact with no redness, or swelling noted infusing. Incision to left knee     covered with Aquacel dressing. Ice applied and plexi-pulses on. Pt denies pain.  Call light within reach and bed in low position and locked. Pt informed to call out for needs verbalizes understanding. Pt c/o nausea. Zofran 4 mg slow IVP given for nausea, including cup of ice to chew.

## 2015-07-02 NOTE — Consults (Signed)
B.H.Srihan Brutus, MD   Medical Director  [facility]  [place], [place] [address]  Tel: [phone]     Physical Medicine & Rehabilitation Note-consult    Patient: Aaron Floyd MRN: [id number]  SSN: [id number]    Date of Birth: March 18, 1946  Age: 70 y.o.  Sex: male      Admit Date: 07/02/2015  Admitting Physician: Kathee Polite., MD    Medical Decision Making/Plan/Recommend:  Gait impairment and functional deficits.  S/p L TKA.   Patient plans for home discharge. Continued rehab at home via [facility] PT would be reasonable.  Patient will continue PT, OT for active/assisted/passive left TKA ROM, strengthening, mobility, transfers, gait training. Will follow progress.    Chief Complaint : Gait dysfunction secondary to below.  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 Arrhythmia      pacemaker   ??? CAD (coronary artery disease)      CABG, stent   ??? Diabetes (HCC)      pt reports most recent  A1C 6.6   ??? GERD (gastroesophageal reflux disease)    ??? Hypertension    ??? Ischemic cardiomyopathy      hx per cardiologist note 05-11-15   ??? Sepsis [facility])      June 2016   ??? Sleep apnea      c-pap   ??? 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 Thyroid disease      hypothyroidism     Subjective:     Date of Evaluation:  July 02, 2015    HPI: Fahim Kats is a 70 y.o. male patient at [facility] who was admitted on 07/02/2015  by Kathee Polite., MD with below mentioned medical history, is being seen for Physical Medicine and Rehabilitation consult.    Vinnie Gombert has had debilitating left knee pain when weight bearing due  to end stage DJD. He underwent a left total knee arthroplasty per Dr. Kathee Polite., MD on 07/02/2015. The post operative course has been uncomplicated so far. Pain well tolerated presently. Patient is to be WBAT LLE.   We are consulted to assist with rehab needs and placement.  Patient shows significant functional deficits, gait dysfunciton due to knee pain, decreased ROM and strength.   Roddrick Sharron is seen and examined today. Medical Records reviewed. Pt states his right knee is also bad, due for surgery next. He denies any other major functional deficits prior to surgery.  He has been independent with ambulation, prior to admission, limited by left knee pain.      Current Level of Function: bed mobility - min A, transfers - min A, decreased balance , ambulation -  3' with minimum A using a RW .    Previous Functional Level-    Independent, limited by knee pains.       Principal Problem:    Status post total left knee replacement (07/02/2015)        Family History   Problem Relation Age of Onset   ??? Diabetes Mother    ??? Stroke Mother    ??? Heart Disease Mother    ???  Diabetes Father    ??? Heart Disease Father       Social History   Substance Use Topics   ??? Smoking status: Former Smoker     Quit date: 01/31/1965   ??? Smokeless tobacco: Not on file   ??? Alcohol use No     Past Surgical History   Procedure Laterality Date   ??? Pr cardiac surg procedure unlist  01/22/2007     cabg    ??? Pr cardiac surg procedure unlist  2002     stent- no longer active after CABG   ??? Hx pacemaker  2010     st jude /defibrillator/pacemaker   ??? Hx heent  1950     tonsillectomy      Prior to Admission medications    Medication Sig Start Date End Date Taking? Authorizing Provider   aspirin delayed-release 81 mg tablet Take 81 mg by mouth daily. Take morning of surgery per anesthesia guidelines.   Yes Historical Provider   aspirin delayed-release 325 mg tablet Take  by mouth every six (6) hours  as needed for Pain. Stop 5 days prior to surgery per anesthesia guidelines.   Yes Historical Provider   sitaGLIPtin-metFORMIN (JANUMET) 50-1,000 mg per tablet Take 1 Tab by mouth two (2) times daily (with meals).   Yes Historical Provider   losartan (COZAAR) 100 mg tablet Take 100 mg by mouth daily.   Yes Historical Provider   esomeprazole (NEXIUM) 20 mg capsule Take  by mouth daily. Take morning of surgery per anesthesia guidelines.   Yes Historical Provider   carvedilol (COREG) 25 mg tablet Take 25 mg by mouth two (2) times daily (with meals). Take morning of surgery per anesthesia guidelines.   Yes Historical Provider   folic acid (FOLVITE) 1 mg tablet Take  by mouth daily.   Yes Historical Provider   levothyroxine (SYNTHROID) 50 mcg tablet Take  by mouth Daily (before breakfast). Take morning of surgery per anesthesia guidelines.   Yes Historical Provider   atorvastatin (LIPITOR) 40 mg tablet Take  by mouth nightly.   Yes Historical Provider   cholecalciferol, vitamin D3, (VITAMIN D3) 2,000 unit tab Take  by mouth daily.   Yes Historical Provider   omega-3 fatty acids-vitamin e 1,000 mg cap Take 1 Cap by mouth.   Yes Historical Provider   multivitamin (ONE A DAY) tablet Take 1 Tab by mouth daily.   Yes Historical Provider     Allergies   Allergen Reactions   ??? Shellfish Derived Hives     Hives/ tolerates contrast dye without issues        Review of Systems: +left knee pain, +antalgic gait. Denies chest pain, shortness of breath, cough, headache, visual problems, abdominal pain, dysurea, calf pain. Pertinent positives are as noted in the medical records and unremarkable otherwise.    Objective:     Vitals:  Blood pressure (!) 189/100, pulse 68, temperature 95.3 ??F (35.2 ??C), resp. rate 14, height 5' 10.5" (1.791 m), weight 203 lb 11.3 oz (92.4 kg), SpO2 99 %.  Temp (24hrs), Avg:96.7 ??F (35.9 ??C), Min:95.3 ??F (35.2 ??C), Max:98.8 ??F (37.1 ??C)    BMI (calculated): 28.9 (06/29/15 0840)   Intake and Output:        Physical Exam:   General: Alert and age appropriately oriented.  No acute cardio respiratory distress.   HEENT: Normocephalic, no conjunctival pallor.  Oral mucosa moist without cyanosis. No JVD.   Lungs: Clear to auscultation bilaterally.  Respiration even  and unlabored   Heart: Regular rate and rhythm, S1, S2   No  Murmurs.   Abdomen: Soft, non-tender, non-distended.    Genitourinary: defered   Neuromuscular:      Grossly no focal motor deficits.  Left knee extension strong  Left ankle dorsiflexion 5/5  Left ankle plantarflexion 5/5  No sensory deficits distally BLE to soft touch.    Skin/extremity: Non tender calves BLE. No rashes, no marginal erythema.                                                                                         Labs/Studies:  Recent Results (from the past 72 hour(s))   GLUCOSE, POC    Collection Time: 07/02/15 10:28 AM   Result Value Ref Range    Glucose (POC) 246 (H) 65 - 100 mg/dL   TYPE & SCREEN    Collection Time: 07/02/15 10:30 AM   Result Value Ref Range    Crossmatch Expiration 07/05/2015     ABO/Rh(D) A POSITIVE     Antibody screen NEG    GLUCOSE, POC    Collection Time: 07/02/15 11:11 AM   Result Value Ref Range    Glucose (POC) 205 (H) 65 - 100 mg/dL       Functional Assessment:  Reviewed participation and progress in therapies                                Ambulation:       Impression/Plan:     Principal Problem:    Status post total left knee replacement (07/02/2015)        Current Facility-Administered Medications   Medication Dose Route Frequency Provider Last Rate Last Dose   ??? carvedilol (COREG) tablet 25 mg  25 mg Oral BID WITH MEALS Deberah Castle, [place]       ??? [START ON 07/03/2015] pantoprazole (PROTONIX) tablet 40 mg  40 mg Oral ACB Deberah Castle, [place]       ??? [START ON 07/03/2015] levothyroxine (SYNTHROID) tablet 50 mcg  50 mcg Oral ACB Deberah Castle, [place]       ??? [START ON 07/03/2015] losartan (COZAAR) tablet 100 mg  100 mg Oral DAILY Deberah Castle, [place]        ??? 0.9% sodium chloride infusion  100 mL/hr IntraVENous CONTINUOUS Deberah Castle, [place] 100 mL/hr at 07/02/15 1520 100 mL/hr at 07/02/15 1520   ??? sodium chloride (NS) flush 5-10 mL  5-10 mL IntraVENous Q8H Deberah Castle, [place]       ??? sodium chloride (NS) flush 5-10 mL  5-10 mL IntraVENous PRN Deberah Castle, [place]       ??? ceFAZolin in 0.9% NS (ANCEF) IVPB soln 2 g  2 g IntraVENous Q8H Deberah Castle, [place]       ??? acetaminophen (OFIRMEV) infusion 1,000 mg  1,000 mg IntraVENous ONCE Deberah Castle, [place]       ??? [START ON 07/03/2015] acetaminophen (TYLENOL) tablet 1,000 mg  1,000 mg Oral Q6H Deberah Castle, [place]       ??? celecoxib (CELEBREX) capsule 200 mg  200 mg  Oral Q12H Deberah Castle, [place]       ??? HYDROmorphone (DILAUDID) tablet 2 mg  2 mg Oral Q4H PRN Deberah Castle, [place]       ??? HYDROmorphone (PF) (DILAUDID) injection 1 mg  1 mg IntraVENous Q3H PRN Deberah Castle, [place]       ??? naloxone [facility]) injection 0.2-0.4 mg  0.2-0.4 mg IntraVENous PRN Deberah Castle, [place]       ??? [START ON 07/03/2015] dexamethasone (DECADRON) injection 10 mg  10 mg IntraVENous ONCE Deberah Castle, [place]       ??? ondansetron [facility]) injection 4 mg  4 mg IntraVENous Q4H PRN Deberah Castle, [place]   4 mg at 07/02/15 1535   ??? diphenhydrAMINE (BENADRYL) capsule 25 mg  25 mg Oral Q4H PRN Deberah Castle, [place]       ??? [START ON 07/03/2015] senna-docusate (PERICOLACE) 8.6-50 mg per tablet 2 Tab  2 Tab Oral DAILY Deberah Castle, [place]       ??? aspirin delayed-release tablet 325 mg  325 mg Oral Q12H Deberah Castle, [place]       ??? [START ON 07/03/2015] oxyCODONE ER (OxyCONTIN) tablet 10 mg  10 mg Oral Q12H Deberah Castle, [place]       ??? SITagliptin/metFORMIN (JANUMET) 50/1000 MG   Oral BID WITH MEALS Kathee Polite., MD            Recommendations: Recommend home discharge, [facility] PT planned.   Continue Acute Rehab Program. PT, OT  to focus on  gait training, transfer training, balance activities, ROM and strengthening exercises.  Coordination of rehab/medical care.   Counseling of Physical Medicine & Rehab care issues management.  Monitoring and management of rehab conditions per the plan of care/orders.     Rehabilitation Management/ Medical Management:   1.Devices:Walkers, Type: Rolling Walker  2.Consult:Rehab team including PT, OT,  and social services.  3. Disposition Rehab-discussed with patient.  4. Thigh-high or knee-high TED's when out of bed.   5. DVT Prophylaxis - aspirin  bid x 35days.  6. Incentive spirometer Q1H while awake  7. Post op hemorrhagic anemia-monitor. asymptomatic   8. Activity: WBAT LLE  9. Planned Labs: CBC,BMP  10. Pain Control: Fair control. Continue scheduled tylenol, Celebrex and  PRN meds.   11. Wound Care: Keep left TKA wound clean and dry and reinforce dressing PRN. May remove Aquacel 1 week post op ad replace with new one. Remove staples 12-14 post surgery, when incision appears appropriately closed and apply benzoin and 1/2" steristrips.      Follow up with Dr Jolayne Haines  2 weeks after discharge from rehab.   Follow up with ORTHO per instructions.      Thank you for the opportunity to participate in the care of this patient.    Signed By: Kathryne Hitch, MD     July 02, 2015

## 2015-07-02 NOTE — Consults (Addendum)
Chief Complaint:   Left knee pain     We are asked to see this patient regarding post op care of medical issues.    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 Caucasian male who underwent a left TKA today per Dr Marlyne Beards under FNB and SAB for a many year history of progressively worsening pain and debility, now to the extent of interfering with activities.  He has failed multiple nonoperative treatments.  He has done well both intraoperatively and in the immediate post op period with stable vitals with the exception of intermittently elevated blood pressure, good pain control.  He has been intermittently nauseated, and has vomited once.  He has a fairly extensive medical history as noted below.  He is in good spirits with family at bedside.     PAST MEDICAL HISTORY:  Past Medical History   Diagnosis Date   ??? Arrhythmia      pacemaker   ??? CAD (coronary artery disease)      CABG, stent   ??? Diabetes (HCC)      pt reports most recent  A1C 6.6   ??? GERD (gastroesophageal reflux disease)    ??? Hypertension    ??? Ischemic cardiomyopathy      hx per cardiologist note 05-11-15   ??? Sepsis [facility])      June 2016   ??? Sleep apnea      c-pap   ??? 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 Thyroid disease      hypothyroidism      PAST SURGICAL HISTORY:  Past Surgical History   Procedure Laterality Date   ??? Pr cardiac surg procedure unlist  01/22/2007     cabg    ??? Pr cardiac surg procedure unlist  2002     stent- no longer active after CABG   ??? Hx pacemaker  2010     st jude /defibrillator/pacemaker   ??? Hx heent  1950     tonsillectomy        PTA MEDICATIONS:  Prior to Admission medications    Medication Sig Start Date End Date Taking? Authorizing Provider   aspirin delayed-release 81 mg tablet Take 81 mg by mouth daily. Take  morning of surgery per anesthesia guidelines.   Yes Historical Provider   aspirin delayed-release 325 mg tablet Take  by mouth every six (6) hours as needed for Pain. Stop 5 days prior to surgery per anesthesia guidelines.   Yes Historical Provider   sitaGLIPtin-metFORMIN (JANUMET) 50-1,000 mg per tablet Take 1 Tab by mouth two (2) times daily (with meals).   Yes Historical Provider   losartan (COZAAR) 100 mg tablet Take 100 mg by mouth daily.   Yes Historical Provider   esomeprazole (NEXIUM) 20 mg capsule Take  by mouth daily. Take morning of surgery per anesthesia guidelines.   Yes Historical Provider   carvedilol (COREG) 25 mg tablet Take 25 mg by mouth two (2) times daily (with meals). Take morning of surgery per anesthesia guidelines.   Yes Historical Provider   folic acid (FOLVITE) 1 mg tablet Take  by mouth daily.   Yes Historical Provider   levothyroxine (SYNTHROID) 50 mcg tablet Take  by mouth Daily (before breakfast). Take  morning of surgery per anesthesia guidelines.   Yes Historical Provider   atorvastatin (LIPITOR) 40 mg tablet Take  by mouth nightly.   Yes Historical Provider   cholecalciferol, vitamin D3, (VITAMIN D3) 2,000 unit tab Take  by mouth daily.   Yes Historical Provider   omega-3 fatty acids-vitamin e 1,000 mg cap Take 1 Cap by mouth.   Yes Historical Provider   multivitamin (ONE A DAY) tablet Take 1 Tab by mouth daily.   Yes Historical Provider       CURRENT INPATIENT MEDICATIONS:  Current Facility-Administered Medications   Medication Dose Route Frequency   ??? carvedilol (COREG) tablet 25 mg  25 mg Oral BID WITH MEALS   ??? [START ON 07/03/2015] pantoprazole (PROTONIX) tablet 40 mg  40 mg Oral ACB   ??? [START ON 07/03/2015] levothyroxine (SYNTHROID) tablet 50 mcg  50 mcg Oral ACB   ??? [START ON 07/03/2015] losartan (COZAAR) tablet 100 mg  100 mg Oral DAILY   ??? 0.9% sodium chloride infusion  100 mL/hr IntraVENous CONTINUOUS   ??? sodium chloride (NS) flush 5-10 mL  5-10 mL IntraVENous Q8H    ??? sodium chloride (NS) flush 5-10 mL  5-10 mL IntraVENous PRN   ??? ceFAZolin in 0.9% NS (ANCEF) IVPB soln 2 g  2 g IntraVENous Q8H   ??? [START ON 07/03/2015] acetaminophen (TYLENOL) tablet 1,000 mg  1,000 mg Oral Q6H   ??? celecoxib (CELEBREX) capsule 200 mg  200 mg Oral Q12H   ??? HYDROmorphone (DILAUDID) tablet 2 mg  2 mg Oral Q4H PRN   ??? HYDROmorphone (PF) (DILAUDID) injection 1 mg  1 mg IntraVENous Q3H PRN   ??? naloxone (NARCAN) injection 0.2-0.4 mg  0.2-0.4 mg IntraVENous PRN   ??? [START ON 07/03/2015] dexamethasone (DECADRON) injection 10 mg  10 mg IntraVENous ONCE   ??? ondansetron (ZOFRAN) injection 4 mg  4 mg IntraVENous Q4H PRN   ??? diphenhydrAMINE (BENADRYL) capsule 25 mg  25 mg Oral Q4H PRN   ??? [START ON 07/03/2015] senna-docusate (PERICOLACE) 8.6-50 mg per tablet 2 Tab  2 Tab Oral DAILY   ??? aspirin delayed-release tablet 325 mg  325 mg Oral Q12H   ??? [START ON 07/03/2015] oxyCODONE ER (OxyCONTIN) tablet 10 mg  10 mg Oral Q12H   ??? SITagliptin/metFORMIN (JANUMET) 50/1000 MG   Oral BID WITH MEALS       ALLERGIES:  Allergies   Allergen Reactions   ??? Shellfish Derived Hives     Hives/ tolerates contrast dye without issues      FAMILY HISTORY:   Family History   Problem Relation Age of Onset   ??? Diabetes Mother    ??? Stroke Mother    ??? Heart Disease Mother    ??? Diabetes Father    ??? Heart Disease Father      SOCIAL HISTORY:  Social History   Substance Use Topics   ??? Smoking status: Former Smoker     Quit date: 01/31/1965   ??? Smokeless tobacco: Not on file   ??? Alcohol use No      History   Drug Use Not on file      Review of Systems  ROS were reviewed, and all are negative outside the HPI.    Physical Examination:   Patient Vitals for the past 24 hrs:   BP Temp Pulse Resp SpO2   07/02/15 1623 - - - - 99 %   07/02/15 1609 183/90 - 70 15 98 %   07/02/15 1534 (!)  189/100 95.3 ??F (35.2 ??C) 68 14 98 %   07/02/15 1456 (!) 177/97 - 67 15 97 %   07/02/15 1441 (!) 170/95 - 70 15 98 %   07/02/15 1426 166/87 - 74 15 97 %    07/02/15 1411 128/71 - 69 14 97 %   07/02/15 1406 129/71 - 70 15 97 %   07/02/15 1401 121/67 - 69 15 97 %   07/02/15 1356 118/66 98.8 ??F (37.1 ??C) 69 16 96 %   07/02/15 1151 156/77 - 76 16 97 %   07/02/15 1146 162/76 - 71 16 95 %   07/02/15 1141 (!) 184/96 - 77 14 99 %   07/02/15 1138 (!) 189/99 - 79 16 99 %   07/02/15 1108 (!) 199/97 - 71 18 97 %   07/02/15 1014 155/87 96.1 ??F (35.6 ??C) 69 16 97 %     Height: 5' 10.5" (179.1 cm)  Weight: 92.4 kg (203 lb 11.3 oz)  BMI (calculated): 28.9  01/23 0701 - 01/23 1900  In: 2550 [I.V.:2550]  Out: 1040 [Urine:840]     General appearance: Oriented and alert, cooperative, nauseated.  Pain well controlled. Family at bedside    Head: Normocephalic, without obvious abnormality, atraumatic  Neck: supple, symmetrical, trachea midline, no carotid bruit and no JVD  Lungs: clear to auscultation bilaterally  Heart: Paced.  Regular rate and rhythm, S1, S2 normal, no murmur, click, rub or gallop  Abdomen: soft, non-tender. Bowel sounds normal. No masses,  no organomegaly.  Foley draining adequate amounts clear yellow urine.  Nausea with one vomiting episode post op  Extremities: Dry, intact dressing to left knee with cooling device attached.  All distal circulation, motion, sensation returned to normal.  All other extremities normal, atraumatic, no cyanosis or edema  Skin: Skin color, texture, turgor normal. No rashes or lesions  Neurologic: Grossly normal    Labs:  Recent Results (from the past 24 hour(s))   GLUCOSE, POC    Collection Time: 07/02/15 10:28 AM   Result Value Ref Range    Glucose (POC) 246 (H) 65 - 100 mg/dL   TYPE & SCREEN    Collection Time: 07/02/15 10:30 AM   Result Value Ref Range    Crossmatch Expiration 07/05/2015     ABO/Rh(D) A POSITIVE     Antibody screen NEG    GLUCOSE, POC    Collection Time: 07/02/15 11:11 AM   Result Value Ref Range    Glucose (POC) 205 (H) 65 - 100 mg/dL       EKG:  No acute findings    PMD:  E Faile    Category Status:  1     Assessment and Plan:   End stage OA left knee  S/P left TKA  CAD  Ischemic cardiomyopathy  GERD  Hypertension  Post op nausea and vomiting  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     Thank you for involving [place] in the care of this pleasant patient. Will follow.

## 2015-07-02 NOTE — Anesthesia Procedure Notes (Signed)
Spinal Block    Start time: 07/02/2015 12:13 PM  End time: 07/02/2015 12:14 PM  Performed by: HORTON JR, Zaylan Kissoon H  Authorized by: HORTON JR, Nachmen Mansel H     Pre-procedure:  Indications: at surgeon's request and primary anesthetic  Preanesthetic Checklist: patient identified, risks and benefits discussed, anesthesia consent, site marked, patient being monitored and timeout performed    Timeout Time: 12:12          Spinal Block:   Patient Position:  Seated  Prep Region:  Lumbar  Prep: DuraPrep      Location:  L2-3  Technique:  Single shot  Local:  Lidocaine 1%  Local Dose (mL):  3    Needle:   Needle Type:  Quincke  Needle Gauge:  22 G  Attempts:  1      Events: CSF confirmed, no blood with aspiration and no paresthesia        Assessment:  Insertion:  Uncomplicated  Patient tolerance:  Patient tolerated the procedure well with no immediate complications

## 2015-07-02 NOTE — Progress Notes (Signed)
TRANSFER - IN REPORT:    Verbal report received from TomRN(name) on Aaron Floyd  being received from PACU(unit) for routine progression of care      Report consisted of patient???s Situation, Background, Assessment and   Recommendations(SBAR).     Information from the following report(s) SBAR, Kardex, Intake/Output and MAR was reviewed with the receiving nurse.    Opportunity for questions and clarification was provided.      Assessment completed upon patient???s arrival to unit and care assumed.

## 2015-07-02 NOTE — Anesthesia Procedure Notes (Signed)
Peripheral Block    Start time: 07/02/2015 11:39 AM  End time: 07/02/2015 11:41 AM  Performed by: Derry Lory  Authorized by: Delman Kitten H       Pre-procedure:   Indications: at surgeon's request, post-op pain management and procedure for pain    Preanesthetic Checklist: patient identified, risks and benefits discussed, site marked, timeout performed, anesthesia consent given and patient being monitored    Timeout Time: 11:38          Block Type:   Block Type:  Adductor canal  Laterality:  Left  Monitoring:  Standard ASA monitoring, responsive to questions, continuous pulse ox, oxygen, frequent vital sign checks and heart rate  Injection Technique:  Single shot  Procedures: ultrasound guided    Patient Position: prone  Prep: chlorhexidine    Location:  Mid thigh  Needle Type:  Stimuplex  Needle Gauge:  22 G  Needle Localization:  Ultrasound guidance  Medication Injected:  0.2%  Adds:  Epi 1:200K  Volume (mL):  30    Assessment:  Number of attempts:  1  Injection Assessment:  Incremental injection every 5 mL, no paresthesia, ultrasound image on chart, local visualized surrounding nerve on ultrasound, negative aspiration for blood and no intravascular symptoms  Patient tolerance:  Patient tolerated the procedure well with no immediate complications

## 2015-07-02 NOTE — Anesthesia Procedure Notes (Signed)
Spinal Block    Start time: 07/02/2015 12:13 PM  End time: 07/02/2015 12:14 PM  Performed by: Derry Lory  Authorized by: Delman Kitten H     Pre-procedure:  Indications: at surgeon's request and primary anesthetic  Preanesthetic Checklist: patient identified, risks and benefits discussed, anesthesia consent, site marked, patient being monitored and timeout performed    Timeout Time: 12:12          Spinal Block:   Patient Position:  Seated  Prep Region:  Lumbar  Prep: DuraPrep      Location:  L2-3  Technique:  Single shot  Local:  Lidocaine 1%  Local Dose (mL):  3    Needle:   Needle Type:  Quincke  Needle Gauge:  22 G  Attempts:  1      Events: CSF confirmed, no blood with aspiration and no paresthesia        Assessment:  Insertion:  Uncomplicated  Patient tolerance:  Patient tolerated the procedure well with no immediate complications

## 2015-07-03 LAB — METABOLIC PANEL, BASIC
Anion gap: 10 mmol/L (ref 7–16)
BUN: 14 MG/DL (ref 8–23)
CO2: 25 mmol/L (ref 21–32)
Calcium: 8.4 MG/DL (ref 8.3–10.4)
Chloride: 103 mmol/L (ref 98–107)
Creatinine: 0.97 MG/DL (ref 0.8–1.5)
GFR est AA: >60 mL/min/{1.73_m2} (ref >60)
GFR est non-AA: >60 mL/min/{1.73_m2} (ref >60)
Glucose: 219 mg/dL — ABNORMAL HIGH (ref 65–100)
Potassium: 4.1 mmol/L (ref 3.5–5.1)
Sodium: 138 mmol/L (ref 136–145)

## 2015-07-03 LAB — MAGNESIUM: Magnesium: 1.2 mg/dL — CL (ref 1.8–2.4)

## 2015-07-03 LAB — HEMOGLOBIN
HGB: 11.6 g/dL — ABNORMAL LOW (ref 13.6–17.2)
HGB: 11.7 g/dL — ABNORMAL LOW (ref 13.6–17.2)

## 2015-07-03 LAB — GLUCOSE, POC
Glucose (POC): 231 mg/dL — ABNORMAL HIGH (ref 65–100)
Glucose (POC): 194 mg/dL — ABNORMAL HIGH (ref 65–100)

## 2015-07-03 MED ORDER — INSULIN LISPRO 100 UNIT/ML INJECTION
100 unit/mL | Freq: Four times a day (QID) | SUBCUTANEOUS | Status: DC
Start: 2015-07-03 — End: 2015-07-04
  Administered 2015-07-04 (×2): via SUBCUTANEOUS

## 2015-07-03 MED ORDER — METOPROLOL TARTRATE 5 MG/5 ML IV SOLN
5 mg/ mL | Freq: Once | INTRAVENOUS | Status: AC
Start: 2015-07-03 — End: 2015-07-03
  Administered 2015-07-03: 15:00:00 via INTRAVENOUS

## 2015-07-03 MED ORDER — MAGNESIUM SULFATE 50 % (4 MEQ/ML) INJECTION
450 mEq/mL (50 %) | Freq: Once | INTRAMUSCULAR | Status: DC
Start: 2015-07-03 — End: 2015-07-03

## 2015-07-03 MED ORDER — HYDRALAZINE 20 MG/ML IJ SOLN
20 mg/mL | Freq: Four times a day (QID) | INTRAMUSCULAR | Status: DC | PRN
Start: 2015-07-03 — End: 2015-07-04
  Administered 2015-07-03: 09:00:00 via INTRAVENOUS

## 2015-07-03 MED ORDER — SODIUM CHLORIDE 0.9 % INJECTION
25 mg/mL | INTRAMUSCULAR | Status: DC | PRN
Start: 2015-07-03 — End: 2015-07-04
  Administered 2015-07-03 (×2): via INTRAVENOUS

## 2015-07-03 MED ORDER — LABETALOL 5 MG/ML IV SOLN
5 mg/mL | INTRAVENOUS | Status: DC
Start: 2015-07-03 — End: 2015-07-03

## 2015-07-03 MED ORDER — MAGNESIUM SULFATE 4 GRAM/100 ML IV PIGGY BACK
4 gram/100 mL ( %) | Freq: Once | INTRAVENOUS | Status: AC
Start: 2015-07-03 — End: 2015-07-03
  Administered 2015-07-03: 13:00:00 via INTRAVENOUS

## 2015-07-03 MED FILL — CELECOXIB 200 MG CAP: 200 mg | ORAL | Qty: 1

## 2015-07-03 MED FILL — METOPROLOL TARTRATE 5 MG/5 ML IV SOLN: 5 mg/ mL | INTRAVENOUS | Qty: 5

## 2015-07-03 MED FILL — PROMETHAZINE 25 MG/ML INJECTION: 25 mg/mL | INTRAMUSCULAR | Qty: 1

## 2015-07-03 MED FILL — MAPAP EXTRA STRENGTH 500 MG TABLET: 500 mg | ORAL | Qty: 2

## 2015-07-03 MED FILL — CARVEDILOL 25 MG TAB: 25 mg | ORAL | Qty: 1

## 2015-07-03 MED FILL — ASPIRIN 325 MG TAB, DELAYED RELEASE: 325 mg | ORAL | Qty: 1

## 2015-07-03 MED FILL — HYDROMORPHONE 2 MG TAB: 2 mg | ORAL | Qty: 1

## 2015-07-03 MED FILL — HYDRALAZINE 20 MG/ML IJ SOLN: 20 mg/mL | INTRAMUSCULAR | Qty: 1

## 2015-07-03 MED FILL — OXYCONTIN 10 MG TABLET,CRUSH RESISTANT,EXTENDED RELEASE: 10 mg | ORAL | Qty: 1

## 2015-07-03 MED FILL — SENNA PLUS 8.6 MG-50 MG TABLET: ORAL | Qty: 2

## 2015-07-03 MED FILL — ONDANSETRON (PF) 4 MG/2 ML INJECTION: 4 mg/2 mL | INTRAMUSCULAR | Qty: 2

## 2015-07-03 MED FILL — LEVOTHROID 50 MCG TABLET: 50 mcg | ORAL | Qty: 1

## 2015-07-03 MED FILL — CEFAZOLIN 2 GRAM/50 ML NS IVPB: INTRAVENOUS | Qty: 50

## 2015-07-03 MED FILL — METFORMIN 500 MG TAB: 500 mg | ORAL | Qty: 2

## 2015-07-03 MED FILL — PANTOPRAZOLE 40 MG TAB, DELAYED RELEASE: 40 mg | ORAL | Qty: 1

## 2015-07-03 MED FILL — LOSARTAN 50 MG TAB: 50 mg | ORAL | Qty: 2

## 2015-07-03 MED FILL — JANUVIA 50 MG TABLET: 50 mg | ORAL | Qty: 1

## 2015-07-03 MED FILL — MAGNESIUM SULFATE 4 GRAM/100 ML IV PIGGY BACK: 4 gram/100 mL ( %) | INTRAVENOUS | Qty: 100

## 2015-07-03 NOTE — Progress Notes (Signed)
Problem: Mobility Impaired (Adult and Pediatric)  Goal: *Acute Goals and Plan of Care (Insert Text)  GOALS (1-4 days):  (1.)Mr. Stallbaumer will move from supine to sit and sit to supine in bed with SUPERVISION.  (2.)Mr. Butch will transfer from bed to chair and chair to bed with STAND BY ASSIST using the least restrictive device.  (3.)Mr. Pulsifer will ambulate with STAND BY ASSIST for 200 feet with the least restrictive device.  (4.)Mr. Nguyen will ambulate up/down 3 steps with bilateral railing with CONTACT GUARD ASSIST with no device.  (5.)Mr. Anfinson will increase left knee ROM to 5??-80??.  ________________________________________________________________________________________________      PHYSICAL THERAPY JOINT CAMP TKA: Daily Note and PM 07/03/2015  INPATIENT: Hospital Day: 2  [place] / Product Type: Medicare /      NAME/AGE/GENDER: Nicholes Hibler is a 70 y.o. male  PRIMARY DIAGNOSIS:  Primary osteoarthritis of left knee               Procedure(s) and Anesthesia Type:     * LEFT KNEE ARTHROPLASTY TOTAL/ STRYKER/ FNB - Spinal (Left)  ICD-10: Treatment Diagnosis:        ?? Pain in Left Knee (M25.562)  ?? Stiffness of Left Knee, Not elsewhere classified (Z61.096)  ?? Difficulty in walking, Not elsewhere classified (R26.2)       ASSESSMENT:      Mr. Grabel presents with impaired strength & mobility s/p left TKA.  Pt still nauseous this afternoon.  He states walking feels good.  He is not having much pain this afternoon.      This section established at most recent assessment   PROBLEM LIST (Impairments causing functional limitations):  1. Decreased Strength  2. Decreased ADL/Functional Activities  3. Decreased Transfer Abilities  4. Decreased Ambulation Ability/Technique  5. Decreased Balance  6. Increased Pain  7. Decreased Activity Tolerance  8. Decreased Flexibility/Joint Mobility  9. Decreased Independence with Home Exercise Program     INTERVENTIONS PLANNED: (Benefits and precautions of physical therapy have been discussed with the patient.)  1. Bed Mobility  2. Gait Training  3. Home Exercise Program (HEP)  4. Therapeutic Exercise/Strengthening  5. Transfer Training  6. Range of Motion: active/assisted/passive  7. Therapeutic Activities      TREATMENT PLAN: Frequency/Duration: Follow patient BID   to address above goals.   Rehabilitation Potential For Stated Goals: GOOD      RECOMMENDED REHABILITATION/EQUIPMENT: (at time of discharge pending progress): Continue Skilled Therapy and Home Health: Physical Therapy.                   HISTORY:   History of Present Injury/Illness (Reason for Referral):  The patient has end stage arthritis of the left knee. The patient was evaluated and examined during a consultation prior to this office visit. There have been no changes to the patient's orthopedic condition since the initial consultation. The patient has failed previous conservative treatment for this condition including antiinflammatories , and lifestyle modifications. The necessity for joint replacement is present. The patient will be admitted the day of surgery for Procedure(s) (LRB):  LEFT KNEE ARTHROPLASTY TOTAL/ STRYKER/ FNB (Left)   Past Medical History/Comorbidities:   Mr. 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 He also has no past medical history of  Adverse effect of anesthesia; Difficult intubation; Malignant hyperthermia due to anesthesia; Nausea & vomiting; or Pseudocholinesterase deficiency.  Mr. Routh  has a past surgical history that includes cardiac surg procedure unlist (01/22/2007); cardiac surg procedure unlist (2002); pacemaker (2010); and heent (1950).  Social History/Living Environment:    Home Environment: Private residence  # Steps to Enter: 0  One/Two Story Residence: One story  Living Alone: Yes  Support Systems: Games developer  Patient Expects to be Discharged to:: Private residence  Current DME Used/Available at Home: None  Tub or Shower Type: Shower  Prior Level of Function/Work/Activity:  Pt was independent without an assistive device prior to this admission   Number of Personal Factors/Comorbidities that affect the Plan of Care: 0: LOW COMPLEXITY   EXAMINATION:   Most Recent Physical Functioning:                   LLE PROM  L Knee Flexion: 75  L Knee Extension: 15           Bed Mobility  Supine to Sit: Minimum assistance  Sit to Supine: Minimum assistance     Transfers  Sit to Stand: Contact guard assistance  Stand to Sit: Contact guard assistance  Bed to Chair: Contact guard assistance     Balance  Sitting: Intact  Standing: With support                Weight Bearing Status  Left Side Weight Bearing: As tolerated  Distance (ft): 25 Feet (ft)  Ambulation - Level of Assistance: Contact guard assistance  Assistive Device: Walker, rolling  Speed/Cadence: Delayed;Pace decreased (<100 feet/min)  Step Length: Left shortened;Right shortened  Stance: Left decreased  Gait Abnormalities: Decreased step clearance  Interventions: Safety awareness training;Verbal cues      Braces/Orthotics: none     Left Knee Cold  Type: Cryocuff       Body Structures Involved:  1. Heart  2. Lungs  3. Metabolic  4. Endocrine  5. Bones  6. Joints  7. Muscles  8. Ligaments Body Functions Affected:  1. Sensory/Pain  2. Cardio  3. Respiratory  4. Movement Related  5. Metobolic/Endocrine Activities and Participation Affected:  1. Learning and Applying Knowledge  2. General Tasks and Demands  3. Mobility   Number of elements that affect the Plan of Care: 1-2: LOW COMPLEXITY   CLINICAL PRESENTATION:   Presentation: Stable and uncomplicated: LOW COMPLEXITY   CLINICAL DECISION MAKING:    [employer] AM-PAC??? ???6 Clicks???   Basic Mobility Inpatient Short Form  How much difficulty does the patient currently have... Unable A Lot A Little None   1.  Turning over in bed (including adjusting bedclothes, sheets and blankets)?    1    2    3    4   2.  Sitting down on and standing up from a chair with arms ( e.g., wheelchair, bedside commode, etc.)    1    2    3    4   3.  Moving from lying on back to sitting on the side of the bed?    1    2    3    4   How much help from another person does the patient currently need... Total A Lot A Little None   4.  Moving to and from a bed to a chair (including a wheelchair)?     1   [ ]  2   [X]  3   [ ]  4   5.  Need to walk in hospital room?   [ ]  1   [ ]  2   [X]  3   [ ]  4   6.  Climbing 3-5 steps with a railing?   [X]  1   [ ]  2   [ ]  3   [ ]  4   ?? 2007, Trustees of [address], under license to [place], [place]. All rights reserved       Score:  Initial: 16 Most Recent: X (Date: -- )     Interpretation of Tool:  Represents activities that are increasingly more difficult (i.e. Bed mobility, Transfers, Gait).       Score 24 23 22-20 19-15 14-10 9-7 6       Modifier CH CI CJ CK CL CM CN         ?? Mobility - Walking and Moving Around:              Z6109 - CURRENT STATUS:    CK - 40%-59% impaired, limited or restricted              G8979 - GOAL STATUS:           CJ - 20%-39% impaired, limited or restricted              U0454 - D/C STATUS:                       ---------------To be determined---------------  [place] / Product Type: Medicare /       Medical Necessity:     ?? Patient is expected to demonstrate progress in strength, range of motion, balance, coordination and functional technique to decrease assistance required with bed mobility, transfers & gait.  Reason for Services/Other Comments:  ?? Patient continues to require skilled intervention due to pt not  independent with functional mobility.   Use of outcome tool(s) and clinical judgement create a POC that gives a: Clear prediction of patient's progress: LOW COMPLEXITY                 TREATMENT:   (In addition to Assessment/Re-Assessment sessions the following treatments were rendered)      Pre-treatment Symptoms/Complaints:  No complaints  Pain: Initial: 0     Post Session:  2/10      Gait Training (15 Minutes):  Gait training to improve and/or restore physical functioning as related to mobility, strength and balance.  Ambulated 25 Feet (ft) with Contact guard assistance using a Walker, rolling and minimal Safety awareness training;Verbal cues related to their stance phase to promote proper body alignment, promote proper body posture and promote proper body mechanics.    Therapeutic Exercise: (15 Minutes):  Exercises per grid below to improve mobility and strength.  Required minimal visual, verbal and manual cues to promote proper body alignment, promote proper body posture and promote proper body mechanics.  Progressed range and repetitions as indicated.         Date:  1/24  Date:    Date:      ACTIVITY/EXERCISE AM PM AM PM AM PM   GROUP THERAPY  [ ]   [ ]   [ ]   [ ]   [ ]   [ ]    Ankle Pumps 15a  20  Quad Sets  10a  15           Gluteal Sets  10a  15           Hip ABd/ADduction  10a  15           Straight Leg Raises  10a  15           Knee Slides  10aa  15           Short Arc Quads               Long Arc Quads               Chair Slides                               B = bilateral; AA = active assistive; A a= active; P = passive       Treatment/Session Assessment:         Response to Treatment:  Tolerated well.     Education:  [ x] Home Exercises   Fall Precautions   Hip Precautions  Going Home Video  [x ] Knee/Hip Prosthesis Review   Walker Management/Safety  Adaptive Equipment as Needed         Interdisciplinary Collaboration:   ?? Physical Therapy Assistant and Registered Nurse      After treatment position/precautions:   ?? Up in chair, Bed/Chair-wheels locked, Bed in low position, Caregiver at bedside, Call light within reach and Family at bedside     Compliance with Program/Exercises: Will assess as treatment progresses.     Recommendations/Intent for next treatment session:  Treatment next visit will focus on increasing Mr. Kocourek's independence with bed mobility, transfers, gait training, strength/ROM exercises, modalities for pain, and patient education.       Total Treatment Duration:  PT Patient Time In/Time Out  Time In: 1415  Time Out: 1445     Verne Spurr, PTA

## 2015-07-03 NOTE — Progress Notes (Signed)
Problem: Mobility Impaired (Adult and Pediatric)  Goal: *Acute Goals and Plan of Care (Insert Text)  GOALS (1-4 days):  (1.)Mr. Jacob will move from supine to sit and sit to supine in bed with SUPERVISION.  (2.)Mr. Serano will transfer from bed to chair and chair to bed with STAND BY ASSIST using the least restrictive device.  (3.)Mr. Laws will ambulate with STAND BY ASSIST for 200 feet with the least restrictive device.  (4.)Mr. Schor will ambulate up/down 3 steps with bilateral railing with CONTACT GUARD ASSIST with no device.  (5.)Mr. Recore will increase left knee ROM to 5??-80??.  ________________________________________________________________________________________________      PHYSICAL THERAPY JOINT CAMP TKA: Daily Note and AM 07/03/2015  INPATIENT: Hospital Day: 2  [place] / Product Type: Medicare /      NAME/AGE/GENDER: Jaramiah Bossard is a 70 y.o. male  PRIMARY DIAGNOSIS:  Primary osteoarthritis of left knee               Procedure(s) and Anesthesia Type:     * LEFT KNEE ARTHROPLASTY TOTAL/ STRYKER/ FNB - Spinal (Left)  ICD-10: Treatment Diagnosis:        ?? Pain in Left Knee (M25.562)  ?? Stiffness of Left Knee, Not elsewhere classified (Z61.096)  ?? Difficulty in walking, Not elsewhere classified (R26.2)       ASSESSMENT:      Mr. Lomeli presents with impaired strength & mobility s/p left TKA. Pt also had decreased stability during out of bed activity. Pt will benefit from follow up therapy to help restore safe function prior to returning home with caregiver.  Pt. Was feeling nauseated and high BP earlier.  He feels much better now and RN states BP down.  Pt. Took a few steps to chair with walker CGA and performed tka exercises with cues and assistance.  Pt. Reports feeling better in the chair.  Family present.      This section established at most recent assessment   PROBLEM LIST (Impairments causing functional limitations):  1. Decreased Strength   2. Decreased ADL/Functional Activities  3. Decreased Transfer Abilities  4. Decreased Ambulation Ability/Technique  5. Decreased Balance  6. Increased Pain  7. Decreased Activity Tolerance  8. Decreased Flexibility/Joint Mobility  9. Decreased Independence with Home Exercise Program    INTERVENTIONS PLANNED: (Benefits and precautions of physical therapy have been discussed with the patient.)  1. Bed Mobility  2. Gait Training  3. Home Exercise Program (HEP)  4. Therapeutic Exercise/Strengthening  5. Transfer Training  6. Range of Motion: active/assisted/passive  7. Therapeutic Activities      TREATMENT PLAN: Frequency/Duration: Follow patient BID   to address above goals.   Rehabilitation Potential For Stated Goals: GOOD      RECOMMENDED REHABILITATION/EQUIPMENT: (at time of discharge pending progress): Continue Skilled Therapy and Home Health: Physical Therapy.                   HISTORY:   History of Present Injury/Illness (Reason for Referral):  The patient has end stage arthritis of the left knee. The patient was evaluated and examined during a consultation prior to this office visit. There have been no changes to the patient's orthopedic condition since the initial consultation. The patient has failed previous conservative treatment for this condition including antiinflammatories , and lifestyle modifications. The necessity for joint replacement is present. The patient will be admitted the day of surgery for Procedure(s) (LRB):  LEFT KNEE  ARTHROPLASTY TOTAL/ STRYKER/ FNB (Left)   Past Medical History/Comorbidities:   Mr. 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 He also has no past medical history of Adverse effect of anesthesia; Difficult  intubation; Malignant hyperthermia due to anesthesia; Nausea & vomiting; or Pseudocholinesterase deficiency.  Mr. Ring  has a past surgical history that includes cardiac surg procedure unlist (01/22/2007); cardiac surg procedure unlist (2002); pacemaker (2010); and heent (1950).  Social History/Living Environment:   Home Environment: Private residence  # Steps to Enter: 0  One/Two Story Residence: One story  Living Alone: Yes  Support Systems: Family member(s), Spouse/Significant Other/Partner  Patient Expects to be Discharged to:: Private residence  Current DME Used/Available at Home: None  Prior Level of Function/Work/Activity:  Pt was independent without an assistive device prior to this admission   Number of Personal Factors/Comorbidities that affect the Plan of Care: 0: LOW COMPLEXITY   EXAMINATION:   Most Recent Physical Functioning:                   LLE PROM  L Knee Flexion: 75  L Knee Extension: 15           Bed Mobility  Supine to Sit: Minimum assistance     Transfers  Sit to Stand: Minimum assistance  Stand to Sit: Contact guard assistance  Bed to Chair: Contact guard assistance     Balance  Sitting: Intact  Standing: With support                Weight Bearing Status  Left Side Weight Bearing: As tolerated  Distance (ft): 3 Feet (ft)  Ambulation - Level of Assistance: Contact guard assistance  Assistive Device: Walker, rolling  Speed/Cadence: Delayed  Step Length: Left shortened;Right shortened  Stance: Left decreased  Gait Abnormalities: Antalgic;Decreased step clearance  Interventions: Safety awareness training;Verbal cues      Braces/Orthotics: none     Left Knee Cold  Type: Cryocuff       Body Structures Involved:  1. Heart  2. Lungs  3. Metabolic  4. Endocrine  5. Bones  6. Joints  7. Muscles  8. Ligaments Body Functions Affected:  1. Sensory/Pain  2. Cardio  3. Respiratory  4. Movement Related  5. Metobolic/Endocrine Activities and Participation Affected:  1. Learning and Applying Knowledge   2. General Tasks and Demands  3. Mobility   Number of elements that affect the Plan of Care: 1-2: LOW COMPLEXITY   CLINICAL PRESENTATION:   Presentation: Stable and uncomplicated: LOW COMPLEXITY   CLINICAL DECISION MAKING:   [employer] AM-PAC??? ???6 Clicks???   Basic Mobility Inpatient Short Form  How much difficulty does the patient currently have... Unable A Lot A Little None   1.  Turning over in bed (including adjusting bedclothes, sheets and blankets)?    1    2    3    4   2.  Sitting down on and standing up from a chair with arms ( e.g., wheelchair, bedside commode, etc.)    1    2    3    4   3.  Moving from lying on back to sitting on the side of the bed?    1    2    3     4   How much help from another person does the patient currently need... Total A Lot A Little None   4.  Moving to and from a bed to a chair (including a wheelchair)?    1    2    3    4   5.  Need to walk in hospital room?    1    2    3    4   6.  Climbing 3-5 steps with a railing?    1    2    3    4   ?? 2007, Trustees of [address], under license to [place], [place]. All rights reserved       Score:  Initial: 16 Most Recent: X (Date: -- )     Interpretation of Tool:  Represents activities that are increasingly more difficult (i.e. Bed mobility, Transfers, Gait).       Score 24 23 22-20 19-15 14-10 9-7 6       Modifier CH CI CJ CK CL CM CN         ?? Mobility - Walking and Moving Around:              Z6109 - CURRENT STATUS:    CK - 40%-59% impaired, limited or restricted              G8979 - GOAL STATUS:           CJ - 20%-39% impaired, limited or restricted              U0454 - D/C STATUS:                       ---------------To be determined---------------  [place] / Product Type: Medicare /       Medical Necessity:     ?? Patient is expected to demonstrate progress in strength, range of  motion, balance, coordination and functional technique to decrease assistance required with bed mobility, transfers & gait.  Reason for Services/Other Comments:  ?? Patient continues to require skilled intervention due to pt not independent with functional mobility.   Use of outcome tool(s) and clinical judgement create a POC that gives a: Clear prediction of patient's progress: LOW COMPLEXITY                 TREATMENT:   (In addition to Assessment/Re-Assessment sessions the following treatments were rendered)      Pre-treatment Symptoms/Complaints:  No complaints  Pain: Initial: 0     Post Session:  2/10      Gait Training (15 Minutes):  Gait training to improve and/or restore physical functioning as related to mobility, strength and balance.  Ambulated 3 Feet (ft) with Contact guard assistance using a Walker, rolling and minimal Safety awareness training;Verbal cues related to their stance phase to promote proper body alignment, promote proper body posture and promote proper body mechanics.    Therapeutic Exercise: (10 Minutes):  Exercises per grid below to improve mobility and strength.  Required minimal visual, verbal and manual cues to promote proper body alignment, promote proper body posture and promote proper body mechanics.  Progressed range and repetitions as indicated.         Date:  1/24  Date:    Date:      ACTIVITY/EXERCISE AM PM AM PM AM PM  GROUP THERAPY  [ ]   [ ]   [ ]   [ ]   [ ]   [ ]    Ankle Pumps 15a              Quad Sets  10a             Gluteal Sets  10a             Hip ABd/ADduction  10a             Straight Leg Raises  10a             Knee Slides  10aa             Short Arc Quads               Long Arc Quads               Chair Slides                               B = bilateral; AA = active assistive; A a= active; P = passive       Treatment/Session Assessment:         Response to Treatment:  Tolerated well.     Education:  [ x] Home Exercises  [X]  Fall Precautions   [ ]  Hip Precautions [ ]  Going Home Video  [x ] Knee/Hip Prosthesis Review  [X]  Walker Management/Safety [ ]  Adaptive Equipment as Needed         Interdisciplinary Collaboration:   ?? Registered Nurse and Rehabilitation Attendant     After treatment position/precautions:   ?? Up in chair, Bed/Chair-wheels locked, Bed in low position, Caregiver at bedside, Call light within reach and Family at bedside     Compliance with Program/Exercises: Will assess as treatment progresses.     Recommendations/Intent for next treatment session:  Treatment next visit will focus on increasing Mr. Howland's independence with bed mobility, transfers, gait training, strength/ROM exercises, modalities for pain, and patient education.       Total Treatment Duration:  PT Patient Time In/Time Out  Time In: 1110  Time Out: 1135     Charlette Caffey, PT

## 2015-07-03 NOTE — Progress Notes (Signed)
[facility]  Face to Face Encounter    Patient???s Name: Aaron Floyd    Date of Birth: 12/20/1945    Ordering Physician:  Gardiner Fanti, Montez Hageman. MD    Primary Diagnosis: Primary osteoarthritis of left knee [M17.12]  S/p left TKA    Date of Face to Face:   07-02-15                                Face to Face Encounter findings are related to primary reason for home care:   yes.     1. I certify that the patient needs intermittent care as follows: physical therapy: gait/stair training    2. I certify that this patient is homebound, that is: 1) patient requires the use of a walker device, special transportation, or assistance of another to leave the home; or 2) patient's condition makes leaving the home medically contraindicated; and 3) patient has a normal inability to leave the home and leaving the home requires considerable and taxing effort.  Patient may leave the home for infrequent and short duration for medical reasons, and occasional absences for non-medical reasons. Homebound status is due to the following functional limitations: Patient's ambulation limited secondary to severe pain and requires the use of an assistive device and the assistance of a caregiver for safe completion.  Patient with strength and ROM deficits limiting ambulation endurance requiring the use of an assistive device and the assistance of a caregiver.  Patient deemed temporarily homebound secondary to increased risk for infection when leaving home and going out into the community.    3. I certify that this patient is under my care and that I, or a nurse practitioner or physician???s assistant, or clinical nurse specialist, or certified nurse midwife, working with me, had a Face-to-Face Encounter that meets the physician Face-to-Face Encounter requirements.  The following are the clinical findings from the Face-to-Face encounter that support the need for skilled services and is a summary of the encounter: see hospital chart         Donato Schultz, BSW  07/03/2015      THE FOLLOWING TO BE COMPLETED BY THE COMMUNITY PHYSICIAN:    I concur with the findings described above from the F2F encounter that this patient is homebound and in need of a skilled service.    Certifying Physician: _____________________________________      Printed Certifying Physician Name: _____________________________________    Date: _________________

## 2015-07-03 NOTE — Progress Notes (Signed)
bp lowered from 200/108 and is now 177/101 with HR 77

## 2015-07-03 NOTE — Progress Notes (Signed)
Phoned Noreene Larsson Heather[place] regarding critical value Mg+ of 1.2 and elevated bp. Orders received from [facility] NP to give patient Magnesium sulfate 4 grams in 0.9 NS 100 ml IV . Orders read back and confirmed. No additional orders given at this time.

## 2015-07-03 NOTE — Progress Notes (Signed)
Pt is trying Nucynta tonight to see if it works without causing nausea.

## 2015-07-03 NOTE — Progress Notes (Signed)
Family in room, set up pt's Home CPAP.

## 2015-07-03 NOTE — Progress Notes (Signed)
Pt c/o nausea. Zofran 4 mg slow IVP given. Room temp cool, cold wet cloth and saltines given to pt. Spouse and daughter in room.

## 2015-07-03 NOTE — Progress Notes (Signed)
C/o of restless leg syndrome.

## 2015-07-03 NOTE — Progress Notes (Signed)
Lopressor 5 mg given slow IVP for elevated bp.

## 2015-07-03 NOTE — Progress Notes (Signed)
States nausea has resolved. Also, bp a little better at  171/97

## 2015-07-03 NOTE — Progress Notes (Signed)
July 03, 2015         Post Op day: 1 Day Post-OpProcedure(s) (LRB):  LEFT KNEE ARTHROPLASTY TOTAL/ STRYKER/ FNB (Left)      Admit Date: 07/02/2015  Admit Diagnosis: Primary osteoarthritis of left knee [M17.12]    LAB:    Recent Results (from the past 24 hour(s))   GLUCOSE, POC    Collection Time: 07/02/15 10:28 AM   Result Value Ref Range    Glucose (POC) 246 (H) 65 - 100 mg/dL   TYPE & SCREEN    Collection Time: 07/02/15 10:30 AM   Result Value Ref Range    Crossmatch Expiration 07/05/2015     ABO/Rh(D) A POSITIVE     Antibody screen NEG    GLUCOSE, POC    Collection Time: 07/02/15 11:11 AM   Result Value Ref Range    Glucose (POC) 205 (H) 65 - 100 mg/dL   HEMOGLOBIN    Collection Time: 07/02/15  8:11 PM   Result Value Ref Range    HGB 11.6 (L) 13.6 - 17.2 g/dL   HEMOGLOBIN    Collection Time: 07/03/15  4:40 AM   Result Value Ref Range    HGB 11.7 (L) 13.6 - 17.2 g/dL   METABOLIC PANEL, BASIC    Collection Time: 07/03/15  4:40 AM   Result Value Ref Range    Sodium 138 136 - 145 mmol/L    Potassium 4.1 3.5 - 5.1 mmol/L    Chloride 103 98 - 107 mmol/L    CO2 25 21 - 32 mmol/L    Anion gap 10 7 - 16 mmol/L    Glucose 219 (H) 65 - 100 mg/dL    BUN 14 8 - 23 MG/DL    Creatinine 1.61 0.8 - 1.5 MG/DL    GFR est AA >09 >60 AV/WUJ/8.11B1    GFR est non-AA >60 >60 ml/min/1.99m2    Calcium 8.4 8.3 - 10.4 MG/DL   MAGNESIUM    Collection Time: 07/03/15  4:40 AM   Result Value Ref Range    Magnesium 1.2 (LL) 1.8 - 2.4 mg/dL     Vital Signs:    Patient Vitals for the past 8 hrs:   BP Temp Pulse Resp SpO2   07/03/15 0333 194/90 96.7 ??F (35.9 ??C) 74 18 96 %   07/02/15 2334 144/75 96.5 ??F (35.8 ??C) 80 18 97 %     Temp (24hrs), Avg:96.6 ??F (35.9 ??C), Min:95.3 ??F (35.2 ??C), Max:98.8 ??F (37.1 ??C)    Body mass index is 28.82 kg/(m^2).  Pain Control:   Pain Assessment  Pain Scale 1: Visual  Pain Intensity 1: 4  Pain Location 1: Knee  Pain Orientation 1: Left  Pain Description 1: Aching, Constant   Pain Intervention(s) 1: Medication (see MAR)    Subjective: Doing well, No complaints, No SOB, No Chest Pain, No Nausea or Vomitting     Objective: Vital Signs are Stable, No Acute Distress, Alert and Oriented, Dressing is Dry,  Neurovascular exam is normal.       PT/OT:            Assistive Device: Walker (comment)           LLE PROM  L Knee Flexion: 60 (~post op)  L Knee Extension: -10 (~post op)    Weight Bearing Status: WBAT    Meds:  @  @  @    Assessment:   Patient Active Problem List   Diagnosis Code   ??? 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        Signed By: Kathee Polite., MD

## 2015-07-03 NOTE — Progress Notes (Signed)
07/03/15 1001   Oxygen Therapy   O2 Sat (%) 96 %   Pulse via Oximetry 90 beats per minute   O2 Device Room air

## 2015-07-03 NOTE — Progress Notes (Signed)
Care Management Interventions  Mode of Transport at Discharge: Self  Transition of Care Consult (CM Consult): Home Health  [facility]: Yes  Discharge Durable Medical Equipment: No  Physical Therapy Consult: Yes  Occupational Therapy Consult: Yes  Current Support Network: Lives with Spouse  Confirm Follow Up Transport: Family  Plan discussed with Pt/Family/Caregiver: Yes  Freedom of Choice Offered: Yes  Discharge Location  Discharge Placement: Home with home health   Patient is a 70 y.o. year old male admitted for Left TKA . Patient lives with His spouse and plans to return home on discharge. Order received to arrange home health. Patient without preference towards agency. Referral sent to [facility]. Patient denies any equipment needs as he has a . Patient requesting we arrange a walker and bedside commode. Referral sent to [facility] who will deliver to the hospital room prior to discharge. Will follow until discharge.  [name]

## 2015-07-03 NOTE — Progress Notes (Signed)
Problem: Self Care Deficits Care Plan (Adult)  Goal: *Acute Goals and Plan of Care (Insert Text)  GOALS:     1. Aaron Floyd will perform lower body dressing using adaptive equipment PRN with min assist within 1-3 day(s).   2. Aaron Floyd will perform toileting and toilet transfers with CGA within 1-3day(s).   3. Aaron Floyd will perform shower transfer and bathing using adaptive equipment PRN with min assist within 1-3 day(s).         ________________________________________________________________________________________________      JOINT CAMP OCCUPATIONAL THERAPY TKA: Initial Assessment and AM 07/03/2015  INPATIENT: Hospital Day: 2  [place] / [place] / Product Type: Medicare /      NAME/AGE/GENDER: Aaron Floyd is a 70 y.o. male  PRIMARY DIAGNOSIS:  Primary osteoarthritis of left knee               Procedure(s) and Anesthesia Type:     * LEFT KNEE ARTHROPLASTY TOTAL/ STRYKER/ FNB - Spinal (Left)  ICD-10: Treatment Diagnosis:        ?? Pain in Left Knee (M25.562)  ?? Stiffness of Left Knee, Not elsewhere classified (Z61.096)       ASSESSMENT:      Aaron Floyd is s/p left TKA and presents with decreased weight bearing on left LE and decreased independence with functional mobility and activities of daily living.  Patient would benefit from skilled Occupational Therapy to maximize independence and safety with self-care task and functional mobility.  Pt would also benefit from education on adaptive equipment and safety precautions in preparation for going home or for recommendations for post-hospital rehab program.  Patient plans for further rehab at home with home health services and good family support.  OT reviewed therapy schedule with patient for today as well as post-op 2.  Patient was able to stand and take steps to the recliner  as charted below.  Patient instructed to call for assistance when needing to get up from the recliner  and all needs in reach.  Patient verbalized understanding  of call light.        This section established at most recent assessment   PROBLEM LIST (Impairments causing functional limitations):  1. Decreased Strength  2. Decreased ADL/Functional Activities  3. Decreased Transfer Abilities  4. Increased Pain  5. Increased Fatigue  6. Decreased Flexibility/Joint Mobility  7. Decreased Knowledge of Precautions    INTERVENTIONS PLANNED: (Benefits and precautions of occupational therapy have been discussed with the patient.)  1. Activities of daily living training  2. Adaptive equipment training  3. Balance training  4. Clothing management  5. Donning&doffing training  6. Theraputic activity      TREATMENT PLAN: Frequency/Duration: Follow patient 1 time to address above goals.  Rehabilitation Potential For Stated Goals: GOOD      RECOMMENDED REHABILITATION/EQUIPMENT: (at time of discharge pending progress): Continue Skilled Therapy and Home Health: Physical Therapy.                   OCCUPATIONAL PROFILE AND HISTORY:   History of Present Injury/Illness (Reason for Referral):  Pt presents this date s/p (left) TKA.    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 He also has no past medical history of Adverse effect of  anesthesia; Difficult intubation; Malignant hyperthermia due to anesthesia; Nausea & vomiting; or Pseudocholinesterase deficiency.  Aaron Floyd  has a past surgical history that includes cardiac surg procedure unlist (01/22/2007); cardiac surg procedure unlist (2002); pacemaker (2010); and heent (1950).  Social History/Living Environment:   Home Environment: Private residence  # Steps to Enter: 0  One/Two Story Residence: One story   Living Alone: Yes  Support Systems: Games developer  Patient Expects to be Discharged to:: Private residence  Current DME Used/Available at Home: None  Tub or Shower Type: Shower  Prior Level of Function/Work/Activity:   Independent with ADLS      Number of Personal Factors/Comorbidities that affect the Plan of Care: Brief history (0):  LOW COMPLEXITY   ASSESSMENT OF OCCUPATIONAL PERFORMANCE::   Most Recent Physical Functioning:   Balance  Sitting: Intact  Standing: With support        Patient Vitals for the past 6 hrs:       BP BP Patient Position SpO2 Pulse   07/03/15 0734 (!) 190/103 At rest 96 % 96   07/03/15 0922 (!) 200/108 - - 85   07/03/15 1001 - - 96 % -   07/03/15 1022 (!) 177/101 - 94 % 77   07/03/15 1050 (!) 171/97 - - 80                     LLE PROM  L Knee Flexion: 75  L Knee Extension: 15 Coordination  Fine Motor Skills-Upper: Left Intact;Right Intact  Gross Motor Skills-Upper: Left Intact;Right Intact           Mental Status  Neurologic State: Alert;Appropriate for age  Orientation Level: Appropriate for age  Cognition: Appropriate decision making;Appropriate for age attention/concentration;Appropriate safety awareness;Follows commands  Perception: Appears intact  Perseveration: No perseveration noted  Safety/Judgement: Awareness of environment;Fall prevention                    Basic ADLs (From Assessment) Complex ADLs (From Assessment)   Basic ADL  Feeding: Setup  Oral Facial Hygiene/Grooming: Supervision  Bathing: Moderate assistance  Upper Body Dressing: Supervision  Lower Body Dressing: Moderate assistance  Toileting: Minimum assistance     Grooming/Bathing/Dressing Activities of Daily Living     Cognitive Retraining  Safety/Judgement: Awareness of environment;Fall prevention                 Functional Transfers  Toilet Transfer : Retail banker: Contact guard assistance;Minimum assistance     Bed/Mat Mobility   Supine to Sit: Minimum assistance  Sit to Stand: Minimum assistance  Bed to Chair: Contact guard assistance           Physical Skills Involved:  1. Balance  2. Mobility Cognitive Skills Affected (resulting in the inability to perform in a timely and safe manner):  1. none Psychosocial Skills Affected:  1. Routines and Behaviors   Number of elements that affect the Plan of Care: 1-3:  LOW COMPLEXITY   CLINICAL DECISION MAKING:   [employer] AM-PAC??? ???6 Clicks???   Basic Mobility Inpatient Short Form  How much help from another person does the patient currently need... Total A Lot A Little None   1.  Putting on and taking off regular lower body clothing?    1    2    3    4   2.  Bathing (including washing, rinsing, drying)?    1   [  X] 2    3    4   3.  Toileting, which includes using toilet, bedpan or urinal?    1    2    3    4   4.  Putting on and taking off regular upper body clothing?    1    2    3    4   5.  Taking care of personal grooming such as brushing teeth?    1    2    3    4   6.  Eating meals?    1    2    3    4   ?? 2007, Trustees of [address], under license to [employer] [place]. All rights reserved   Score:  Initial: 17 Most Recent: X (Date: -- )     Interpretation of Tool:  Represents activities that are increasingly more difficult (i.e. Bed mobility, Transfers, Gait).       Score 24 23 22-20 19-15 14-10 9-7 6       Modifier CH CI CJ CK CL CM CN         ?? Self Care:              [phone] - CURRENT STATUS:    CK - 40%-59% impaired, limited or restricted              [id number] - GOAL STATUS:           CJ - 20%-39% impaired, limited or restricted              [id number] - D/C STATUS:                       ---------------To be determined---------------  [place] / [place] / Product Type: Medicare /       Medical Necessity:     ?? Patient is expected to demonstrate progress in balance, coordination and  functional technique to decrease assistance required with self care and functional mobility and improve safety during self care and functional mobility.  Reason for Services/Other Comments:  ?? Patient continues to require skilled intervention due to decreased self care and functional mobility.   Use of outcome tool(s) and clinical judgement create a POC that gives a: LOW COMPLEXITY                 TREATMENT:   (In addition to Assessment/Re-Assessment sessions the following treatments were rendered)      Pre-treatment Symptoms/Complaints:  None      Pain: Initial:   Pain Intensity 1: 2  Pain Location 1: Knee  Pain Orientation 1: Left  Pain Intervention(s) 1: Repositioned  Post Session:  2/10      Assessment/Reassessment only, no treatment provided today     Treatment/Session Assessment:         Response to Treatment:  Tolerated well     Education:   Home Exercises   Fall Precautions   Hip Precautions  Going Home Video   Knee/Hip Prosthesis Review   Walker Management/Safety  Adaptive Equipment as Needed         Interdisciplinary Collaboration:   ?? Physical Therapist  ?? Occupational Therapist  ?? Registered Nurse     After treatment position/precautions:   ?? Up in chair  ??  Bed/Chair-wheels locked  ?? Bed in low position  ?? Call light within reach  ?? RN notified  ?? Family at bedside     Compliance with Program/Exercises: Will assess as treatment progresses.     Recommendations/Intent for next treatment session:  Treatment next visit will focus on increasing Mr. Cavagnaro's independence with bed mobility, transfers, gait training, strength/ROM exercises, modalities for pain, and patient education.       Total Treatment Duration:  OT Patient Time In/Time Out  Time In: 1100  Time Out: [address] OT

## 2015-07-03 NOTE — Other (Signed)
No anesthesia complaints/complications

## 2015-07-03 NOTE — Progress Notes (Signed)
Patient is A&Ox4. Able to verbalize needs. Resting quietly with no distress noted. Dressing to surgical site is dry and intact. Neurovascular and peripheral vascular checks WNL. Voiding clear yellow urine. Ambulates with walker. Denies needs. Bed low and locked. Call light within reach. Instructed to call for assistance. Patient verbalizes understanding. Will monitor.

## 2015-07-03 NOTE — Progress Notes (Addendum)
Labs, notes, orders, meds, reviewed.  Blood pressure remains labile.  Apresoline and lopressor ordered prn.  Glucose running high, using SSI bid.  No vomiting today.    Mag today: 1.2, 4 Grams given IV  BMP:     Ref. Range 07/03/2015 04:40   Sodium Latest Ref Range: 136 - 145 mmol/L 138   Potassium Latest Ref Range: 3.5 - 5.1 mmol/L 4.1   Chloride Latest Ref Range: 98 - 107 mmol/L 103   CO2 Latest Ref Range: 21 - 32 mmol/L 25   Anion gap Latest Ref Range: 7 - 16 mmol/L 10   Glucose Latest Ref Range: 65 - 100 mg/dL 161 (H)   BUN Latest Ref Range: 8 - 23 MG/DL 14   Creatinine Latest Ref Range: 0.8 - 1.5 MG/DL 0.96   Calcium Latest Ref Range: 8.3 - 10.4 MG/DL 8.4   Magnesium Latest Ref Range: 1.8 - 2.4 mg/dL 1.2 (LL)   GFR est non-AA Latest Ref Range: >60 ml/min/1.21m2 >60   GFR est AA Latest Ref Range: >60 ml/min/1.21m2 >60     Hgb:  11.7    PLAN:  Increase SSI to ac and HS  Will sign off, but follow labs and blood pressure tomorrow  Repeat BMP and mag in am

## 2015-07-04 LAB — METABOLIC PANEL, BASIC
Anion gap: 8 mmol/L (ref 7–16)
BUN: 19 MG/DL (ref 8–23)
CO2: 28 mmol/L (ref 21–32)
Calcium: 8.7 MG/DL (ref 8.3–10.4)
Chloride: 104 mmol/L (ref 98–107)
Creatinine: 1.01 MG/DL (ref 0.8–1.5)
GFR est AA: >60 mL/min/{1.73_m2} (ref >60)
GFR est non-AA: >60 mL/min/{1.73_m2} (ref >60)
Glucose: 154 mg/dL — ABNORMAL HIGH (ref 65–100)
Potassium: 3.8 mmol/L (ref 3.5–5.1)
Sodium: 140 mmol/L (ref 136–145)

## 2015-07-04 LAB — HEMOGLOBIN A1C WITH EAG
Est. average glucose: 169 mg/dL
Hemoglobin A1c: 7.5 % — ABNORMAL HIGH (ref 4.8–6.0)

## 2015-07-04 LAB — MAGNESIUM: Magnesium: 2.2 mg/dL (ref 1.8–2.4)

## 2015-07-04 LAB — GLUCOSE, POC
Glucose (POC): 182 mg/dL — ABNORMAL HIGH (ref 65–100)
Glucose (POC): 160 mg/dL — ABNORMAL HIGH (ref 65–100)

## 2015-07-04 LAB — HEMOGLOBIN: HGB: 11.4 g/dL — ABNORMAL LOW (ref 13.6–17.2)

## 2015-07-04 MED ORDER — TAPENTADOL 50 MG TAB
50 mg | ORAL | Status: DC | PRN
Start: 2015-07-04 — End: 2015-07-04
  Administered 2015-07-04 (×3): via ORAL

## 2015-07-04 MED ORDER — TAPENTADOL 50 MG TAB
50 mg | ORAL | Status: DC | PRN
Start: 2015-07-04 — End: 2015-07-04

## 2015-07-04 MED ORDER — ASPIRIN 325 MG TAB, DELAYED RELEASE
325 mg | ORAL_TABLET | Freq: Two times a day (BID) | ORAL | 1 refills | Status: AC
Start: 2015-07-04 — End: 2015-08-08

## 2015-07-04 MED ORDER — TAPENTADOL 50 MG TAB
50 mg | ORAL_TABLET | ORAL | 0 refills | Status: DC | PRN
Start: 2015-07-04 — End: 2015-10-18

## 2015-07-04 MED FILL — CARVEDILOL 25 MG TAB: 25 mg | ORAL | Qty: 1

## 2015-07-04 MED FILL — JANUVIA 50 MG TABLET: 50 mg | ORAL | Qty: 1

## 2015-07-04 MED FILL — LOSARTAN 50 MG TAB: 50 mg | ORAL | Qty: 2

## 2015-07-04 MED FILL — PANTOPRAZOLE 40 MG TAB, DELAYED RELEASE: 40 mg | ORAL | Qty: 1

## 2015-07-04 MED FILL — ASPIRIN 325 MG TAB, DELAYED RELEASE: 325 mg | ORAL | Qty: 1

## 2015-07-04 MED FILL — NUCYNTA 50 MG TABLET: 50 mg | ORAL | Qty: 1

## 2015-07-04 MED FILL — NUCYNTA 50 MG TABLET: 50 mg | ORAL | Qty: 2

## 2015-07-04 MED FILL — MAPAP EXTRA STRENGTH 500 MG TABLET: 500 mg | ORAL | Qty: 2

## 2015-07-04 MED FILL — CELECOXIB 200 MG CAP: 200 mg | ORAL | Qty: 1

## 2015-07-04 MED FILL — SENNA PLUS 8.6 MG-50 MG TABLET: ORAL | Qty: 2

## 2015-07-04 MED FILL — LEVOTHROID 50 MCG TABLET: 50 mcg | ORAL | Qty: 1

## 2015-07-04 NOTE — Op Note (Signed)
Op Notes  by Deberah Castle, PA at 07/04/15 1230                Author: Deberah Castle, PA  Service: Orthopedic Surgery  Author Type: Physician Assistant       Filed: 07/05/15 0739  Date of Service: 07/04/15 1230  Status: Signed           Editor: Deberah Castle, PA (Physician Assistant)  Cosigner: Kathee Polite., MD at 07/09/15 1637               [facility]   Total Knee Arthroplasty: Posterior Cruciate Retaining    Patient:Aaron Floyd    DOB: 15-Nov-1945   Medical Record Number:[id number]   Pre-operative Diagnosis:  Primary osteoarthritis of left knee    Post-operative Diagnosis: Status post total left knee replacement   [facility]: [facility]   Surgeon: Gardiner Fanti, MD    Assistant: Deberah Castle, PA-C      Anesthesia: Spinal and FNB      Procedure:Procedure(s) (LRB):   LEFT KNEE ARTHROPLASTY TOTAL/ STRYKER/ FNB (Left)    The complexity of the total joint surgery requires the use of a first assistant for positioning, retraction and expertise in closure.       Tourniquet Time: 0 minutes   EBL: 250 cc   Findings: severe degenerative arthritis, patellar osteophytes, posterior femoral osteophytes    BMI: Body mass index is 28.82 kg/(m^2).      Aaron Floyd was brought to the operating room and positioned on the operating table.   He was anesthestized with anesthesia.  A foley catheter was placed preoperatively and IV antibiotics was administered. Prior to the incision being made a timeout was called identifying the patient,  procedure ,operative side and surgeon The operative leg was prepped and draped in the usual sterile manner.  An anterior longitudinal incision was accomplished just medial to the tibial tubercle and extending approximal 6 centimeters proximal to the superior  pole of the patella.  A medial parapatellar capsular incision was performed. The medial capsular flap was elevated around to the insertion of the semimembranous tendon.  The patella was  everted and the knee flexed and externally rotated.  The medial and  external menisci were excised.  The lateral half of the fat pad excised and the patella femoral ligament was released.  The anterior cruciate ligament was resect and the posterior cruciate ligament was retained.  Using extramedullary instrumentation,  the tibial cut was accomplished with appropriate posterior slope.        The distal femur was addressed.  A drill hole was made above the intracondylar notch.  Using appropriate intramedullary instrumentation,a five degree valgus distal cut was accomplished.  The femur was sized to a 5 component.  The anterior and posterior  cuts were then made about the distal femur.  The osteophytes were removed from the tibial and femoral surfaces.  The flexion and extension gaps were assessed with the appropriate spacer blocks.  Additional surgical procedures included: none.  The flexion  and extension gaps were deemed appropriately balanced.  The appropriate cutting blocks were then utilized to perform the anterior, posterior and chamfer cuts, with appropriate lateral translation accomplished for the patellofemoral groove.      Approximately 9 mm of bone was removed from the high side of the tibia.     The tibia was sized to a 5 component.  The tibial base plate was pinned into place with the appropriate external rotation and  stem site prepared.      A preliminary range of motion was accomplished with the above size trial components.  A 9 millimeter polyethylene insert allowed the patient to obtain full extension as well as appropriate flexion.  The patient's ligaments were stable in flexion and extension  to medial and lateral stressing and the alignment was through the appropriate mechanical axis.        The patella was then everted.  The bone was resect to accommodate the three peg patella button.  A trial reduction revealed appropriate tracking through the patellofemoral groove with no lateral retinacular  release being accomplished.      All trial components were removed. The knee was irrigated.  There were no femoral deficiencies.  There were no tibial deficiencies.  No augmentation was utilized.   Two packages of cement were mixed and the permanent Tibial and Femoral components were  cemented into place.   The patella component was then  cemented in place.      Aaron Floyd knee was placed through range of motion and noted to be stable as mentioned above with the trail components.  The wound was dry,  therefore no drain was used. The operative knee was injected with 60 cc of Naropin, 10 cc's of morphine and 1 cc of 30 mg of Toradol.        The knee was then soaked with a diluted betadine solution for approximately 3 min. This was then thoroughly irrigated.        The capsular layer was closed using a #1 PDS suture. Then, 1 gram (100 mg/ml) of Transexamic Acid was injected into the joint space. The subcutaneous layers were closed using 2-0 Stratafix.  Finally the skin was closed using 3-0 Vicryl and skin staples,  which were applied in occlusive fashion and sterile bandage applied.  An Iceman cryo pad was applied on the operative leg.  Sponge count and needle counts were correct.  Aaron Floyd  left the operating room       Implants:             Implant Name  Type  Inv. Item  Serial No.  Manufacturer  Lot No.  LRB  No. Used              CEMENT BNE HV R 40GM -- PALACOS - [id number]    CEMENT BNE HV R 40GM -- PALACOS  [id number]  [employer]  [id number]  Left  2     patella      [id number]    [id number]  Left  1     BASEPLT TIB UNIV TRIATHLN 5 --  - [id number]    BASEPLT TIB UNIV TRIATHLN 5 --   [id number]  [employer] HOWM  [id number]  Left  1     FEM KNE CEM CR SZ 5 LT -- TRIATHLON - [id number]    FEM KNE CEM CR SZ 5 LT -- TRIATHLON  [id number]  [employer] HOW[employer]  [id number]  Left  1     INSERT TIB CR TRIATHLN 5 -- - [id number]     INSERT TIB CR TRIATHLN 5 --  [id number]  [employer] HOW[employer]  [id number]  Left  1                Signed  By: Gardiner Fanti, MD    07/05/2015,  7:38 AM

## 2015-07-04 NOTE — Progress Notes (Signed)
Problem: Self Care Deficits Care Plan (Adult)  Goal: *Acute Goals and Plan of Care (Insert Text)  GOALS:     1. Aaron Floyd will perform lower body dressing using adaptive equipment PRN with min assist within 1-3 day(s). GOAL MET 07/04/2015    2. Aaron Floyd will perform toileting and toilet transfers with CGA within 1-3day(s). GOAL MET 07/04/2015    3. Aaron Floyd will perform shower transfer and bathing using adaptive equipment PRN with min assist within 1-3 day(s). GOAL MET 07/04/2015          ________________________________________________________________________________________________      JOINT CAMP OCCUPATIONAL THERAPY TKA: Daily Note and AM 07/04/2015  INPATIENT: Hospital Day: 3  [place] / Product Type: Medicare /      NAME/AGE/GENDER: Aaron Floyd is a 70 y.o. male  PRIMARY DIAGNOSIS:  Primary osteoarthritis of left knee               Procedure(s) and Anesthesia Type:     * LEFT KNEE ARTHROPLASTY TOTAL/ STRYKER/ FNB - Spinal (Left)  ICD-10: Treatment Diagnosis:        ?? Pain in Left Knee (M25.562)  ?? Stiffness of Left Knee, Not elsewhere classified (Q65.784)       ASSESSMENT:      Aaron Floyd is s/p (left) TKA and presents with decreased weight bearing on (left) LE, and decreased independence for functional mobility and ADL's.  Completed shower and dressing as charted below in ADL grid and is ambulating with RW and stand by assistance. Pt has met 3/3 ADL goals.  Reviewed therapy schedule for remainder of today and tomorrow.  Pt plans for further rehab at home with home health services and good family support . Pt instructed to call for assistance getting up from recliner with verbal understanding of call light. B feet elevated, tray table beside pt, with all needs in reach.  RN aware pt up in recliner.          This section established at most recent assessment   PROBLEM LIST (Impairments causing functional limitations):  1. Decreased Strength   2. Decreased ADL/Functional Activities  3. Decreased Transfer Abilities  4. Increased Pain  5. Increased Fatigue  6. Decreased Flexibility/Joint Mobility  7. Decreased Knowledge of Precautions    INTERVENTIONS PLANNED: (Benefits and precautions of occupational therapy have been discussed with the patient.)  1. Activities of daily living training  2. Adaptive equipment training  3. Balance training  4. Clothing management  5. Donning&doffing training  6. Theraputic activity      TREATMENT PLAN: Frequency/Duration: Follow patient 1 time to address above goals.  Rehabilitation Potential For Stated Goals: GOOD      RECOMMENDED REHABILITATION/EQUIPMENT: (at time of discharge pending progress): Continue Skilled Therapy and Home Health: Physical Therapy.                   OCCUPATIONAL PROFILE AND HISTORY:   History of Present Injury/Illness (Reason for Referral):  Pt presents this date s/p (left) TKA.    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 He also has no past medical history of Adverse effect of anesthesia; Difficult intubation; Malignant hyperthermia due to anesthesia; Nausea & vomiting; or Pseudocholinesterase deficiency.  Aaron Floyd  has  a past surgical history that includes cardiac surg procedure unlist (01/22/2007); cardiac surg procedure unlist (2002); pacemaker (2010); and heent (1950).  Social History/Living Environment:   Home Environment: Private residence  # Steps to Enter: 0  One/Two Story Residence: One story  Living Alone: Yes  Support Systems: Copy  Patient Expects to be Discharged to:: Private residence  Current DME Used/Available at Home: None  Tub or Shower Type: Multimedia programmer   Prior Level of Function/Work/Activity:   Independent with ADLS      Number of Personal Factors/Comorbidities that affect the Plan of Care: Brief history (0):  LOW COMPLEXITY   ASSESSMENT OF OCCUPATIONAL PERFORMANCE::   Most Recent Physical Functioning:   Balance  Sitting: Intact  Standing: Without support        Patient Vitals for the past 6 hrs:   BP BP Patient Position SpO2 Pulse   07/04/15 0755 169/89 At rest 95 % 68   07/04/15 0927 - - 97 % -                     LLE PROM  L Knee Flexion: 95  L Knee Extension: 4             Mental Status  Neurologic State: Alert;Appropriate for age  Orientation Level: Appropriate for age  Cognition: Appropriate decision making;Appropriate for age attention/concentration;Appropriate safety awareness;Follows commands  Perception: Appears intact  Perseveration: No perseveration noted  Safety/Judgement: Awareness of environment;Fall prevention                    Basic ADLs (From Assessment) Complex ADLs (From Assessment)   Basic ADL  Feeding: Setup  Oral Facial Hygiene/Grooming: Supervision  Bathing: Moderate assistance  Upper Body Dressing: Supervision  Lower Body Dressing: Moderate assistance  Toileting: Minimum assistance     Grooming/Bathing/Dressing Activities of Daily Living   Grooming  Grooming Assistance: Supervision/set up  Washing Face: Supervision/set-up  Washing Hands: Supervision/set-up  Shaving: Supervision/set-up  Brushing/Combing Hair: Supervision/set-up Cognitive Retraining  Safety/Judgement: Awareness of environment;Fall prevention   Upper Body Bathing  Bathing Assistance: Supervision/set-up  Position Performed: Seated in chair;Standing     Lower Body Bathing  Bathing Assistance: Supervision/set-up;Stand-by assistance  Perineal  : Supervision/set-up;Stand-by assistance  Position Performed: Seated in chair;Standing  Lower Body : Stand-by assistance;Contact guard assistance  Position Performed: Seated in chair;Standing  Adaptive Equipment: Grab bar;Shower Data processing manager Assistance  Dressing Assistance: Supervision/set-up  Pullover Shirt: Supervision/set-up Functional Transfers  Toilet Transfer : Government social research officer Transfer: Lawyer Assistance  Dressing Assistance: Minimum assistance  Underpants: Supervision/set-up  Pants With Elastic Waist: Supervision/set-up  Socks: Moderate assistance  Antiembolitic Stockings: Maximum assistance Bed/Mat Mobility  Sit to Stand: Supervision  Bed to Chair: Stand-by asssistance           Physical Skills Involved:  1. Balance  2. Mobility Cognitive Skills Affected (resulting in the inability to perform in a timely and safe manner):  1. none Psychosocial Skills Affected:  1. Routines and Behaviors   Number of elements that affect the Plan of Care: 1-3:  LOW COMPLEXITY   CLINICAL DECISION MAKING:   [employer] AM-PAC??? ???6 Clicks???   Basic Mobility Inpatient Short Form  How much help from another person does the patient currently need... Total A Lot A Little None   1.  Putting on and taking off regular lower body clothing?   _0  1   _1   2   _0  3   _1  4   2.  Bathing (including washing, rinsing, drying)?   _2  1   _3  2   _4  3   _5  4   3.  Toileting, which includes using toilet, bedpan or urinal?   _6  1   _7  2   _8  3   _9  4   4.  Putting on and taking off regular upper body clothing?   _10  1   _11  2   _12  3   _13  4   5.  Taking care of personal grooming such as brushing teeth?   _14  1   _15  2   _16  3   _17  4   6.  Eating meals?   _18  1   _19  2   _20  3   _21  4   ?? 2007, Trustees of [employer], under license to [employer]. All rights reserved   Score:  Initial: 17 Most Recent: X (Date: -- )     Interpretation of Tool:  Represents activities that are increasingly more difficult (i.e. Bed mobility, Transfers, Gait).       Score 24 23 22-20 19-15 14-10 9-7 6       Modifier CH CI CJ CK CL CM CN         ?? Self Care:               [phone] - CURRENT STATUS:    CK - 40%-59% impaired, limited or restricted              [id number] - GOAL STATUS:           CJ - 20%-39% impaired, limited or restricted              G3358 - D/C STATUS:                       ---------------To be determined---------------  [place] / Product Type: Medicare /       Medical Necessity:     ?? Patient is expected to demonstrate progress in balance, coordination and functional technique to decrease assistance required with self care and functional mobility and improve safety during self care and functional mobility.  Reason for Services/Other Comments:  ?? Patient continues to require skilled intervention due to decreased self care and functional mobility.   Use of outcome tool(s) and clinical judgement create a POC that gives a: LOW COMPLEXITY                 TREATMENT:   (In addition to Assessment/Re-Assessment sessions the following treatments were rendered)      Pre-treatment Symptoms/Complaints:  None      Pain: Initial:   Pain Intensity 1: 3  Pain Location 1: Knee  Pain Orientation 1: Left  Pain Intervention(s) 1: Shower  Post Session:  0/10      Self Care: (30): Procedure(s) (per grid) utilized to improve and/or restore self-care/home management as related to dressing, bathing, toileting and grooming. Required minimal visual, verbal and tactile cueing to facilitate activities of daily living skills and compensatory activities.     Treatment/Session Assessment:         Response to Treatment:  Tolerated well     Education:  _22  Home Exercises  _23  Fall Precautions  _24   Hip Precautions _0  Going Home Video  _1  Knee/Hip Prosthesis Review  _2  Walker Management/Safety _3  Adaptive Equipment as Needed         Interdisciplinary Collaboration:   ?? Occupational Therapist and Registered Nurse     After treatment position/precautions:   ?? Up in chair, Bed/Chair-wheels locked, Bed in low position, Call light  within reach, RN notified and Family at bedside     Compliance with Program/Exercises: compliant and achieved ADL goals     Recommendations/Intent for next treatment session:  Treatment next visit will focus on increasing Mr. Deshler's independence with bed mobility, transfers, gait training, strength/ROM exercises, modalities for pain, and patient education.       Total Treatment Duration:30  OT Patient Time In/Time Out  Time In: 0930  Time Out: [place], OT

## 2015-07-04 NOTE — Progress Notes (Signed)
07/04/15 0927   Oxygen Therapy   O2 Sat (%) 97 %   Pulse via Oximetry 69 beats per minute   O2 Device Room air   Good npc. Pt working on IS. Pt encouraged to do 10 breaths per hour while awake on IS. B/S clear. No respiratory distress noted at this time.

## 2015-07-04 NOTE — Progress Notes (Signed)
Discharge instructions given to pt and wife. Voiced understanding. No questions or cocnerns at this time. Pt waiting on lunch to dc

## 2015-07-04 NOTE — Progress Notes (Signed)
July 04, 2015         Post Op day: 2 Days Post-Op Procedure(s) (LRB):  LEFT KNEE ARTHROPLASTY TOTAL/ STRYKER/ FNB (Left)      Admit Date: 07/02/2015  Admit Diagnosis: Primary osteoarthritis of left knee [M17.12]       Principle Problem: Status post total left knee replacement.           Subjective: Doing well, No complaints, No SOB, No Chest Pain, No Nausea or Vomiting     Objective:   Vital Signs are Stable, No Acute Distress, Alert and Oriented, Dressing is Dry,  Neurovascular exam is normal.     Assessment / Plan :  Patient Active Problem List   Diagnosis Code   ??? Status post total left knee replacement Z96.652    Patient Vitals for the past 8 hrs:   BP Temp Pulse Resp SpO2   07/04/15 0500 (!) 176/99 96.5 ??F (35.8 ??C) 80 18 96 %   07/04/15 0045 156/89 96.9 ??F (36.1 ??C) 75 18 97 %    Temp (24hrs), Avg:96.6 ??F (35.9 ??C), Min:96.1 ??F (35.6 ??C), Max:96.9 ??F (36.1 ??C)    Body mass index is 28.82 kg/(m^2).    Lab Results   Component Value Date/Time    HGB 11.4 07/04/2015 05:45 AM      Pt seen by and discussed with Supervising Physician   Continue PT  HTN: notify hospitalist   Dc home today or tomorrow       Signed By: Deberah Castle, PA  07/04/2015,  7:53 AM

## 2015-07-04 NOTE — Progress Notes (Signed)
Shift Assessment Complete. Pt is post op day 2 from LTKA. Pt is A&Ox 3.  +2 pedal pulses with purposeful movement in all four extremities. Dressing is clean, dry and intact.Pt denies any pain or need at this time.  Bed low and locked. Side rails x3.  Call light with in reach.  Pt verbalizes understanding of call light.

## 2015-07-04 NOTE — Progress Notes (Signed)
Problem: Mobility Impaired (Adult and Pediatric)  Goal: *Acute Goals and Plan of Care (Insert Text)  GOALS (1-4 days):  (1.)[name] will move from supine to sit and sit to supine in bed with SUPERVISION.  (2.)[name] will transfer from bed to chair and chair to bed with STAND BY ASSIST using the least restrictive device. MET 07/04/15  (3.)[name] will ambulate with STAND BY ASSIST for 200 feet with the least restrictive device.  (4.)[name] will ambulate up/down 3 steps with bilateral railing with CONTACT GUARD ASSIST with no device.  (5.)[name] will increase left knee ROM to 5??-80??.  ________________________________________________________________________________________________      PHYSICAL THERAPY JOINT CAMP TKA: Daily Note and AM 07/04/2015  INPATIENT: Hospital Day: 3  [place] / Product Type: Medicare /      NAME/AGE/GENDER: Aaron Floyd is a 70 y.o. male  PRIMARY DIAGNOSIS:  Primary osteoarthritis of left knee               Procedure(s) and Anesthesia Type:     * LEFT KNEE ARTHROPLASTY TOTAL/ STRYKER/ FNB - Spinal (Left)  ICD-10: Treatment Diagnosis:        ?? Pain in Left Knee (M25.562)  ?? Stiffness of Left Knee, Not elsewhere classified (Q03.474)  ?? Difficulty in walking, Not elsewhere classified (R26.2)       ASSESSMENT:      [name] presents with impaired strength & mobility s/p left TKA.  Pt feeling much better this morning.  He plans to go home today with HHPT.   This section established at most recent assessment   PROBLEM LIST (Impairments causing functional limitations):  1. Decreased Strength  2. Decreased ADL/Functional Activities  3. Decreased Transfer Abilities  4. Decreased Ambulation Ability/Technique  5. Decreased Balance  6. Increased Pain  7. Decreased Activity Tolerance  8. Decreased Flexibility/Joint Mobility  9. Decreased Independence with Home Exercise Program     INTERVENTIONS PLANNED: (Benefits and precautions of physical therapy have been discussed with the patient.)  1. Bed Mobility  2. Gait Training  3. Home Exercise Program (HEP)  4. Therapeutic Exercise/Strengthening  5. Transfer Training  6. Range of Motion: active/assisted/passive  7. Therapeutic Activities      TREATMENT PLAN: Frequency/Duration: Follow patient BID   to address above goals.   Rehabilitation Potential For Stated Goals: GOOD      RECOMMENDED REHABILITATION/EQUIPMENT: (at time of discharge pending progress): Continue Skilled Therapy and Home Health: Physical Therapy.                   HISTORY:   History of Present Injury/Illness (Reason for Referral):  The patient has end stage arthritis of the left knee. The patient was evaluated and examined during a consultation prior to this office visit. There have been no changes to the patient's orthopedic condition since the initial consultation. The patient has failed previous conservative treatment for this condition including antiinflammatories , and lifestyle modifications. The necessity for joint replacement is present. 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 He also has no past medical history of Adverse effect of anesthesia; Difficult intubation;  Malignant hyperthermia due to anesthesia; Nausea & vomiting; or Pseudocholinesterase deficiency.  Aaron Floyd  has a past surgical history that includes cardiac surg procedure unlist (01/22/2007); cardiac surg procedure unlist (2002); pacemaker (2010); and heent (1950).  Social History/Living Environment:    Home Environment: Private residence  # Steps to Enter: 0  One/Two Story Residence: One story  Living Alone: Yes  Support Systems: Copy  Patient Expects to be Discharged to:: Private residence  Current DME Used/Available at Home: None  Tub or Shower Type: Shower  Prior Level of Function/Work/Activity:  Pt was independent without an assistive device prior to this admission   Number of Personal Factors/Comorbidities that affect the Plan of Care: 0: LOW COMPLEXITY   EXAMINATION:   Most Recent Physical Functioning:                   LLE PROM  L Knee Flexion: 95  L Knee Extension: 4                 Transfers  Sit to Stand: Supervision  Stand to Sit: Supervision  Bed to Chair: Stand-by asssistance                      Weight Bearing Status  Left Side Weight Bearing: As tolerated  Distance (ft): 190 Feet (ft) (x2)  Ambulation - Level of Assistance: Supervision;Stand-by asssistance  Assistive Device: Walker, rolling  Speed/Cadence: Delayed;Pace decreased (<100 feet/min)  Step Length: Left shortened;Right shortened  Stance: Left decreased  Gait Abnormalities: Decreased step clearance  Interventions: Verbal cues      Braces/Orthotics: none     Left Knee Cold  Type: Cryocuff       Body Structures Involved:  1. Heart  2. Lungs  3. Metabolic  4. Endocrine  5. Bones  6. Joints  7. Muscles  8. Ligaments Body Functions Affected:  1. Sensory/Pain  2. Cardio  3. Respiratory  4. Movement Related  5. Metobolic/Endocrine Activities and Participation Affected:  1. Learning and Applying Knowledge  2. General Tasks and Demands  3. Mobility   Number of elements that affect the Plan of Care: 1-2: LOW COMPLEXITY   CLINICAL PRESENTATION:   Presentation: Stable and uncomplicated: LOW COMPLEXITY   CLINICAL DECISION MAKING:   [employer] AM-PAC??? ???6 Clicks???   Basic Mobility Inpatient Short Form  How much difficulty does the patient currently have... Unable A Lot A Little None    1.  Turning over in bed (including adjusting bedclothes, sheets and blankets)?   '[ ]'  1   '[ ]'  2   '[X]'  3   '[ ]'  4   2.  Sitting down on and standing up from a chair with arms ( e.g., wheelchair, bedside commode, etc.)   '[ ]'  1   '[ ]'  2   '[X]'  3   '[ ]'  4   3.  Moving from lying on back to sitting on the side of the bed?   '[ ]'  1   '[ ]'  2   '[X]'  3   '[ ]'  4   How much help from another person does the patient currently need... Total A Lot A Little None   4.  Moving to and from a bed to a chair (including a wheelchair)?   '[ ]'  1   '[ ]'  2   '[X]'  3   '[ ]'  4   5.  Need to walk in hospital room?   '[ ]'  1   '[ ]'   2   '[X]'  3   '[ ]'  4   6.  Climbing 3-5 steps with a railing?   '[X]'  1   '[ ]'  2   '[ ]'  3   '[ ]'  4   ?? 2007, [employer], under license to [employer]. All rights reserved       Score:  Initial: 16 Most Recent: X (Date: -- )     Interpretation of Tool:  Represents activities that are increasingly more difficult (i.e. Bed mobility, Transfers, Gait).       Score 24 23 22-20 19-15 14-10 9-7 6       Modifier CH CI CJ CK CL CM CN         ?? Mobility - Walking and Moving Around:              [id number] - CURRENT STATUS:    CK - 40%-59% impaired, limited or restricted              G8979 - GOAL STATUS:           CJ - 20%-39% impaired, limited or restricted              U0454 - D/C STATUS:                       ---------------To be determined---------------  [place] / Product Type: Medicare /       Medical Necessity:     ?? Patient is expected to demonstrate progress in strength, range of motion, balance, coordination and functional technique to decrease assistance required with bed mobility, transfers & gait.  Reason for Services/Other Comments:  ?? Patient continues to require skilled intervention due to pt not independent with functional mobility.   Use of outcome tool(s) and clinical judgement create a POC that gives a: Clear prediction of patient's progress: LOW COMPLEXITY                  TREATMENT:   (In addition to Assessment/Re-Assessment sessions the following treatments were rendered)      Pre-treatment Symptoms/Complaints:  No complaints  Pain: Initial: 0     Post Session:  2/10      Gait Training (15 Minutes):  Gait training to improve and/or restore physical functioning as related to mobility, strength and balance.  Ambulated 190 Feet (ft) (x2) with Supervision;Stand-by asssistance using a Walker, rolling and minimal Verbal cues related to their stance phase to promote proper body alignment, promote proper body posture and promote proper body mechanics.    Therapeutic Exercise: (45 Minutes (group)):  Exercises per grid below to improve mobility and strength.  Required minimal visual, verbal and manual cues to promote proper body alignment, promote proper body posture and promote proper body mechanics.  Progressed range and repetitions as indicated.         Date:  1/24  Date:  07/04/15  Date:      ACTIVITY/EXERCISE AM PM AM PM AM PM   GROUP THERAPY  '[ ]'   '[ ]'   [employer] ]  '[ ]'   '[ ]'   '[ ]'    Ankle Pumps 15a  20   20         Quad Sets  10a  15 20         Gluteal Sets  10a  15  20         Hip ABd/ADduction  10a  15  20         Straight Leg Raises  10a  15  20         Knee Slides  10aa  15  20         Short Arc Quads      11         Long Arc Quads               Chair Slides      20                         B = bilateral; AA = active assistive; A a= active; P = passive       Treatment/Session Assessment:         Response to Treatment:  Tolerated well.     Education:  [ x] Home Exercises  '[X]'  Fall Precautions  '[ ]'  Hip Precautions '[ ]'  Going Home Video  [x ] Knee/Hip Prosthesis Review  '[X]'  Walker Management/Safety '[ ]'  Adaptive Equipment as Needed         Interdisciplinary Collaboration:   ?? Physical Therapy Assistant and Registered Nurse     After treatment position/precautions:   ?? Up in chair, Bed/Chair-wheels locked, Bed in low position, Caregiver at bedside, Call light within reach and Family at bedside      Compliance with Program/Exercises: Will assess as treatment progresses.     Recommendations/Intent for next treatment session:  Treatment next visit will focus on increasing Mr. Koeller's independence with bed mobility, transfers, gait training, strength/ROM exercises, modalities for pain, and patient education.       Total Treatment Duration:  PT Patient Time In/Time Out  Time In: 1030  Time Out: [address] PTA

## 2015-07-04 NOTE — Op Note (Signed)
[facility]  Total Knee Arthroplasty: Posterior Cruciate Retaining   Patient:Aaron Floyd   DOB: 1946-05-19  Medical Record Number:[id number]  Pre-operative Diagnosis:  Primary osteoarthritis of left knee   Post-operative Diagnosis: Status post total left knee replacement  [facility]: [facility]  Surgeon: Gardiner Fanti, MD   Assistant: Deberah Castle, PA-C    Anesthesia: Spinal and FNB    Procedure:Procedure(s) (LRB):  LEFT KNEE ARTHROPLASTY TOTAL/ STRYKER/ FNB (Left)   The complexity of the total joint surgery requires the use of a first assistant for positioning, retraction and expertise in closure.     Tourniquet Time: 0 minutes  EBL: 250 cc  Findings: severe degenerative arthritis, patellar osteophytes, posterior femoral osteophytes   BMI: Body mass index is 28.82 kg/(m^2).    Aaron Floyd was brought to the operating room and positioned on the operating table.  He was anesthestized with anesthesia.  A foley catheter was placed preoperatively and IV antibiotics was administered. Prior to the incision being made a timeout was called identifying the patient, procedure ,operative side and surgeon The operative leg was prepped and draped in the usual sterile manner.  An anterior longitudinal incision was accomplished just medial to the tibial tubercle and extending approximal 6 centimeters proximal to the superior pole of the patella.  A medial parapatellar capsular incision was performed. The medial capsular flap was elevated around to the insertion of the semimembranous tendon.  The patella was everted and the knee flexed and externally rotated.  The medial and external menisci were excised.  The lateral half of the fat pad excised and the patella femoral ligament was released.  The anterior cruciate ligament was resect and the posterior cruciate ligament was retained.  Using extramedullary instrumentation, the tibial cut was accomplished with appropriate posterior slope.       The distal femur was addressed.  A drill hole was made above the intracondylar notch.  Using appropriate intramedullary instrumentation,a five degree valgus distal cut was accomplished.  The femur was sized to a 5 component.  The anterior and posterior cuts were then made about the distal femur.  The osteophytes were removed from the tibial and femoral surfaces.  The flexion and extension gaps were assessed with the appropriate spacer blocks.  Additional surgical procedures included: none.  The flexion and extension gaps were deemed appropriately balanced.  The appropriate cutting blocks were then utilized to perform the anterior, posterior and chamfer cuts, with appropriate lateral translation accomplished for the patellofemoral groove.    Approximately 9 mm of bone was removed from the high side of the tibia.    The tibia was sized to a 5 component.  The tibial base plate was pinned into place with the appropriate external rotation and stem site prepared.    A preliminary range of motion was accomplished with the above size trial components.  A 9 millimeter polyethylene insert allowed the patient to obtain full extension as well as appropriate flexion.  The patient's ligaments were stable in flexion and extension to medial and lateral stressing and the alignment was through the appropriate mechanical axis.      The patella was then everted.  The bone was resect to accommodate the three peg patella button.  A trial reduction revealed appropriate tracking through the patellofemoral groove with no lateral retinacular release being accomplished.    All trial components were removed. The knee was irrigated.  There were no femoral deficiencies.  There were no tibial deficiencies.  No augmentation was utilized.  Two packages of cement were mixed and the permanent Tibial and Femoral components were cemented into place.   The patella component was then  cemented in place.     Aaron Floyd knee was placed through range of motion and noted to be stable as mentioned above with the trail components.  The wound was dry, therefore no drain was used. The operative knee was injected with 60 cc of Naropin, 10 cc's of morphine and 1 cc of 30 mg of Toradol.      The knee was then soaked with a diluted betadine solution for approximately 3 min. This was then thoroughly irrigated.      The capsular layer was closed using a #1 PDS suture. Then, 1 gram (100 mg/ml) of Transexamic Acid was injected into the joint space. The subcutaneous layers were closed using 2-0 Stratafix.  Finally the skin was closed using 3-0 Vicryl and skin staples, which were applied in occlusive fashion and sterile bandage applied.  An Iceman cryo pad was applied on the operative leg.  Sponge count and needle counts were correct.  Aaron Floyd left the operating room     Implants:   Implant Name Type Inv. Item Serial No. Manufacturer Lot No. LRB No. Used   CEMENT BNE HV R 40GM -- PALACOS - [id number]  CEMENT BNE HV R 40GM -- PALACOS [id number] [employer] [id number] Left 2   patella   [id number]  [id number] Left 1   BASEPLT TIB UNIV TRIATHLN 5 --  - [id number]  BASEPLT TIB UNIV TRIATHLN 5 --  WOBEA [employer] HOWM WOBEA Left 1   FEM KNE CEM CR SZ 5 LT -- TRIATHLON - [id number]  FEM KNE CEM CR SZ 5 LT -- TRIATHLON [id number] [employer] HOWM [id number] Left 1   INSERT TIB CR TRIATHLN 5 -- - [id number]   INSERT TIB CR TRIATHLN 5 -- [id number] [employer] HOWM [id number] Left 1         Signed By: Gardiner Fanti, MD   07/05/2015,  7:38 AM

## 2015-07-06 ENCOUNTER — Encounter: Admit: 2015-07-06 | Discharge: 2015-07-06 | Payer: MEDICARE | Primary: Family Medicine

## 2015-07-09 ENCOUNTER — Encounter: Admit: 2015-07-09 | Discharge: 2015-07-09 | Payer: MEDICARE | Primary: Family Medicine

## 2015-07-10 ENCOUNTER — Encounter: Primary: Family Medicine

## 2015-07-13 ENCOUNTER — Encounter: Admit: 2015-07-13 | Discharge: 2015-07-13 | Payer: MEDICARE | Primary: Family Medicine

## 2015-07-16 ENCOUNTER — Encounter: Admit: 2015-07-16 | Discharge: 2015-07-16 | Payer: MEDICARE | Primary: Family Medicine

## 2015-07-17 ENCOUNTER — Encounter: Primary: Family Medicine

## 2015-07-19 ENCOUNTER — Encounter: Admit: 2015-07-19 | Discharge: 2015-07-19 | Payer: MEDICARE | Primary: Family Medicine

## 2015-07-20 ENCOUNTER — Encounter: Primary: Family Medicine

## 2015-07-23 ENCOUNTER — Encounter: Admit: 2015-07-23 | Discharge: 2015-07-23 | Payer: MEDICARE | Primary: Family Medicine

## 2015-07-24 ENCOUNTER — Encounter: Primary: Family Medicine

## 2015-07-25 ENCOUNTER — Encounter: Admit: 2015-07-25 | Discharge: 2015-07-25 | Payer: MEDICARE | Primary: Family Medicine

## 2015-07-27 ENCOUNTER — Encounter: Primary: Family Medicine

## 2015-10-16 ENCOUNTER — Ambulatory Visit: Payer: MEDICARE | Primary: Family Medicine

## 2015-10-18 ENCOUNTER — Inpatient Hospital Stay: Admit: 2015-10-18 | Payer: MEDICARE | Attending: Orthopaedic Surgery | Primary: Family Medicine

## 2015-10-18 DIAGNOSIS — Z01810 Encounter for preprocedural cardiovascular examination: Secondary | ICD-10-CM

## 2015-10-18 LAB — GLUCOSE, POC: Glucose (POC): 207 mg/dL — ABNORMAL HIGH (ref 65–100)

## 2015-10-18 NOTE — Other (Signed)
Patient verified name, DOB, and surgery as listed in [facility].    Type 1B surgery, walk in assessment complete.    Labs per surgeon: none needed.   Labs per anesthesia protocol: none needed per [facility] protocols. [id number]  EKG: last done 07/06/15-requesting record from [facility].     Called and [facility]ic[facility] for most recent device check and ekg.     Copy of [employer] pacemaker card with [employer] Jude information placed on chart for reference. Called scheduling to have pacemaker added to case.     Hibiclens and instructions given per hospital policy.    Patient provided with handouts including Guide to Surgery, Pain Management, Hand Hygiene, Blood Transfusion Education, and [employer] [facility] Brochure.    Patient answered medical/surgical history questions at their best of ability. All prior to admission medications documented in [facility]. Original medication prescription bottles not visualized during patient appointment.     Patient instructed to hold all vitamins 7 days prior to surgery and NSAIDS 5 days prior to surgery, patient verbalized understanding. Medications to be held: 325 mg aspirin (decrease to 81 mg daily starting today), vitamins.     Patient instructed to continue previous medications as prescribed prior to surgery and to take the following medications the day of surgery according to anesthesia guidelines with a small sip of water: carvedilol, nexium, synthroid. instructed to bring c-pap, janumet, and [employer] dos.     Patient taught back and verbalized understanding.

## 2015-10-19 NOTE — Other (Signed)
ekg dated 07/06/15 received from [facility] - is within anesthesia protocols for surgery.

## 2015-10-22 ENCOUNTER — Inpatient Hospital Stay: Payer: MEDICARE

## 2015-10-22 LAB — GLUCOSE, POC: Glucose (POC): 174 mg/dL — ABNORMAL HIGH (ref 65–100)

## 2015-10-22 MED ORDER — HYDROMORPHONE 2 MG TAB
2 mg | ORAL_TABLET | ORAL | 0 refills | Status: AC | PRN
Start: 2015-10-22 — End: ?

## 2015-10-22 MED ORDER — LIDOCAINE (PF) 20 MG/ML (2 %) IJ SOLN
20 mg/mL (2 %) | INTRAMUSCULAR | Status: DC | PRN
Start: 2015-10-22 — End: 2015-10-22
  Administered 2015-10-22: 12:00:00 via INTRAVENOUS

## 2015-10-22 MED ORDER — ACETAMINOPHEN 1,000 MG/100 ML (10 MG/ML) IV
1000 mg/100 mL (10 mg/mL) | Freq: Once | INTRAVENOUS | Status: AC
Start: 2015-10-22 — End: 2015-10-23
  Administered 2015-10-22: 22:00:00 via INTRAVENOUS

## 2015-10-22 MED ORDER — HYDRALAZINE 25 MG TAB
25 mg | Freq: Three times a day (TID) | ORAL | Status: DC
Start: 2015-10-22 — End: 2015-10-22
  Administered 2015-10-22: 20:00:00 via ORAL

## 2015-10-22 MED ORDER — FOLIC ACID 1 MG TAB
1 mg | Freq: Every day | ORAL | Status: DC
Start: 2015-10-22 — End: 2015-10-23
  Administered 2015-10-23: 13:00:00 via ORAL

## 2015-10-22 MED ORDER — SODIUM CHLORIDE 0.9 % IJ SYRG
INTRAMUSCULAR | Status: DC | PRN
Start: 2015-10-22 — End: 2015-10-23

## 2015-10-22 MED ORDER — SENNOSIDES-DOCUSATE SODIUM 8.6 MG-50 MG TAB
Freq: Every day | ORAL | Status: DC
Start: 2015-10-22 — End: 2015-10-23
  Administered 2015-10-23: 13:00:00 via ORAL

## 2015-10-22 MED ORDER — CARVEDILOL 25 MG TAB
25 mg | Freq: Two times a day (BID) | ORAL | Status: DC
Start: 2015-10-22 — End: 2015-10-23
  Administered 2015-10-22 – 2015-10-23 (×2): via ORAL

## 2015-10-22 MED ORDER — ONDANSETRON (PF) 4 MG/2 ML INJECTION
4 mg/2 mL | INTRAMUSCULAR | Status: DC | PRN
Start: 2015-10-22 — End: 2015-10-23

## 2015-10-22 MED ORDER — HYDROMORPHONE (PF) 2 MG/ML IJ SOLN
2 mg/mL | INTRAMUSCULAR | Status: DC | PRN
Start: 2015-10-22 — End: 2015-10-22

## 2015-10-22 MED ORDER — FENTANYL CITRATE (PF) 50 MCG/ML IJ SOLN
50 mcg/mL | Freq: Once | INTRAMUSCULAR | Status: DC
Start: 2015-10-22 — End: 2015-10-22

## 2015-10-22 MED ORDER — SODIUM CHLORIDE 0.9 % IJ SYRG
Freq: Three times a day (TID) | INTRAMUSCULAR | Status: DC
Start: 2015-10-22 — End: 2015-10-23
  Administered 2015-10-23 (×3): via INTRAVENOUS

## 2015-10-22 MED ORDER — ATORVASTATIN 40 MG TAB
40 mg | Freq: Every evening | ORAL | Status: DC
Start: 2015-10-22 — End: 2015-10-23
  Administered 2015-10-23 (×2): via ORAL

## 2015-10-22 MED ORDER — LACTATED RINGERS IV
INTRAVENOUS | Status: DC
Start: 2015-10-22 — End: 2015-10-22
  Administered 2015-10-22: 11:00:00 via INTRAVENOUS

## 2015-10-22 MED ORDER — CELECOXIB 200 MG CAP
200 mg | Freq: Two times a day (BID) | ORAL | Status: DC
Start: 2015-10-22 — End: 2015-10-23
  Administered 2015-10-23 (×2): via ORAL

## 2015-10-22 MED ORDER — AZILSARTAN MEDOXOMIL 40 MG TABLET
40 mg | Freq: Every day | ORAL | Status: DC
Start: 2015-10-22 — End: 2015-10-23

## 2015-10-22 MED ORDER — SERTRALINE 25 MG TAB
25 mg | Freq: Every evening | ORAL | Status: DC
Start: 2015-10-22 — End: 2015-10-23
  Administered 2015-10-23 (×2): via ORAL

## 2015-10-22 MED ORDER — LACTATED RINGERS IV
INTRAVENOUS | Status: DC
Start: 2015-10-22 — End: 2015-10-22

## 2015-10-22 MED ORDER — HYDROMORPHONE 2 MG TAB
2 mg | ORAL | Status: DC | PRN
Start: 2015-10-22 — End: 2015-10-23

## 2015-10-22 MED ORDER — ACETAMINOPHEN 500 MG TAB
500 mg | Freq: Four times a day (QID) | ORAL | Status: DC
Start: 2015-10-22 — End: 2015-10-23
  Administered 2015-10-23 (×3): via ORAL

## 2015-10-22 MED ORDER — .PHARMACY TO SUBSTITUTE PER PROTOCOL
Status: DC | PRN
Start: 2015-10-22 — End: 2015-10-22

## 2015-10-22 MED ORDER — CHOLECALCIFEROL (VITAMIN D3) 1,000 UNIT (25 MCG) TAB
Freq: Every day | ORAL | Status: DC
Start: 2015-10-22 — End: 2015-10-23
  Administered 2015-10-22 – 2015-10-23 (×2): via ORAL

## 2015-10-22 MED ORDER — LIDOCAINE HCL 1 % (10 MG/ML) IJ SOLN
10 mg/mL (1 %) | INTRAMUSCULAR | Status: DC | PRN
Start: 2015-10-22 — End: 2015-10-22
  Administered 2015-10-22: 11:00:00 via SUBCUTANEOUS

## 2015-10-22 MED ORDER — DEXAMETHASONE SODIUM PHOSPHATE 10 MG/ML IJ SOLN
10 mg/mL | Freq: Once | INTRAMUSCULAR | Status: DC
Start: 2015-10-22 — End: 2015-10-23

## 2015-10-22 MED ORDER — ROPIVACAINE (PF) 5 MG/ML (0.5 %) INJECTION
5 mg/mL (0. %) | INTRAMUSCULAR | Status: DC | PRN
Start: 2015-10-22 — End: 2015-10-22
  Administered 2015-10-22: 11:00:00 via PERINEURAL

## 2015-10-22 MED ORDER — PROPOFOL 10 MG/ML IV EMUL
10 mg/mL | INTRAVENOUS | Status: DC | PRN
Start: 2015-10-22 — End: 2015-10-22
  Administered 2015-10-22: 12:00:00 via INTRAVENOUS

## 2015-10-22 MED ORDER — MIDAZOLAM 1 MG/ML IJ SOLN
1 mg/mL | Freq: Once | INTRAMUSCULAR | Status: AC | PRN
Start: 2015-10-22 — End: 2015-10-22
  Administered 2015-10-22: 11:00:00 via INTRAVENOUS

## 2015-10-22 MED ORDER — SITAGLIPTIN 50 MG TAB
50 mg | Freq: Two times a day (BID) | ORAL | Status: DC
Start: 2015-10-22 — End: 2015-10-23
  Administered 2015-10-22 – 2015-10-23 (×2): via ORAL

## 2015-10-22 MED ORDER — OXYCODONE 5 MG TAB
5 mg | Freq: Once | ORAL | Status: DC | PRN
Start: 2015-10-22 — End: 2015-10-22

## 2015-10-22 MED ORDER — LEVOTHYROXINE 50 MCG TAB
50 mcg | Freq: Every day | ORAL | Status: DC
Start: 2015-10-22 — End: 2015-10-23
  Administered 2015-10-23 (×2): via ORAL

## 2015-10-22 MED ORDER — HYDRALAZINE 25 MG TAB
25 mg | Freq: Every evening | ORAL | Status: DC
Start: 2015-10-22 — End: 2015-10-23
  Administered 2015-10-23 (×2): via ORAL

## 2015-10-22 MED ORDER — NALOXONE 0.4 MG/ML INJECTION
0.4 mg/mL | INTRAMUSCULAR | Status: DC | PRN
Start: 2015-10-22 — End: 2015-10-23

## 2015-10-22 MED ORDER — PRAMIPEXOLE 0.25 MG TAB
0.25 mg | Freq: Every evening | ORAL | Status: DC
Start: 2015-10-22 — End: 2015-10-23
  Administered 2015-10-23: 01:00:00 via ORAL

## 2015-10-22 MED ORDER — HYDROMORPHONE (PF) 1 MG/ML IJ SOLN
1 mg/mL | INTRAMUSCULAR | Status: DC | PRN
Start: 2015-10-22 — End: 2015-10-23

## 2015-10-22 MED ORDER — DIPHENHYDRAMINE 25 MG CAP
25 mg | ORAL | Status: DC | PRN
Start: 2015-10-22 — End: 2015-10-23

## 2015-10-22 MED ORDER — PROMETHAZINE 25 MG/ML INJECTION
25 mg/mL | INTRAMUSCULAR | Status: DC | PRN
Start: 2015-10-22 — End: 2015-10-22

## 2015-10-22 MED ORDER — ASPIRIN 325 MG TAB
325 mg | Freq: Every evening | ORAL | Status: DC
Start: 2015-10-22 — End: 2015-10-23
  Administered 2015-10-23 (×2): via ORAL

## 2015-10-22 MED ORDER — PANTOPRAZOLE 40 MG TAB, DELAYED RELEASE
40 mg | Freq: Every day | ORAL | Status: DC
Start: 2015-10-22 — End: 2015-10-23
  Administered 2015-10-23 (×2): via ORAL

## 2015-10-22 MED FILL — OFIRMEV 1,000 MG/100 ML (10 MG/ML) INTRAVENOUS SOLUTION: 1000 mg/100 mL (10 mg/mL) | INTRAVENOUS | Qty: 100

## 2015-10-22 MED FILL — HYDRALAZINE 25 MG TAB: 25 mg | ORAL | Qty: 1

## 2015-10-22 MED FILL — CARVEDILOL 25 MG TAB: 25 mg | ORAL | Qty: 1

## 2015-10-22 MED FILL — JANUVIA 50 MG TABLET: 50 mg | ORAL | Qty: 1

## 2015-10-22 MED FILL — VITAMIN D3 25 MCG (1,000 UNIT) TABLET: 25 mcg (1,000 unit) | ORAL | Qty: 2

## 2015-10-22 MED FILL — PROPOFOL 10 MG/ML IV EMUL: 10 mg/mL | INTRAVENOUS | Qty: 100

## 2015-10-22 MED FILL — XYLOCAINE-MPF 20 MG/ML (2 %) INJECTION SOLUTION: 20 mg/mL (2 %) | INTRAMUSCULAR | Qty: 60

## 2015-10-22 MED FILL — MIDAZOLAM 1 MG/ML IJ SOLN: 1 mg/mL | INTRAMUSCULAR | Qty: 2

## 2015-10-22 MED FILL — FENTANYL CITRATE (PF) 50 MCG/ML IJ SOLN: 50 mcg/mL | INTRAMUSCULAR | Qty: 2

## 2015-10-22 MED FILL — NAROPIN (PF) 5 MG/ML (0.5 %) INJECTION SOLUTION: 5 mg/mL (0. %) | INTRAMUSCULAR | Qty: 45

## 2015-10-22 NOTE — Anesthesia Procedure Notes (Signed)
Peripheral Block    Start time: 10/22/2015 7:07 AM  End time: 10/22/2015 7:13 AM  Performed by: Nehemiah SettleHERTY, Emaly Boschert R  Authorized by: Nehemiah SettleHERTY, Ayleah Hofmeister R       Pre-procedure:   Indications: at surgeon's request, post-op pain management and procedure for pain    Preanesthetic Checklist: patient identified, risks and benefits discussed, site marked, timeout performed, anesthesia consent given and patient being monitored    Timeout Time: 07:06          Block Type:   Block Type:  Femoral single shot  Monitoring:  Standard ASA monitoring, continuous pulse ox, frequent vital sign checks, heart rate, oxygen and responsive to questions  Injection Technique:  Single shot  Procedures: ultrasound guided and nerve stimulator    Prep: chlorhexidine    Needle Type:  Stimuplex  Needle Gauge:  20 G  Needle Localization:  Ultrasound guidance and nerve stimulator  Motor Response: minimal motor response >0.4 mA    Medication Injected:  0.375%  ropivacaine  Adds:  Epi 1:400K  Volume (mL):  30    Assessment:  Number of attempts:  1  Injection Assessment:  Incremental injection every 5 mL, local visualized surrounding nerve on ultrasound, negative aspiration for blood, no paresthesia, no intravascular symptoms and ultrasound image on chart  Patient tolerance:  Patient tolerated the procedure well with no immediate complications  3 cc 1% lidocaine injected at site of needle insertion.

## 2015-10-22 NOTE — Anesthesia Pre-Procedure Evaluation (Addendum)
Anesthetic History   No history of anesthetic complications            Review of Systems / Medical History  Patient summary reviewed and pertinent labs reviewed    Pulmonary        Sleep apnea: CPAP           Neuro/Psych   Within defined limits           Cardiovascular    Hypertension: well controlled          Pacemaker (ICD), CAD, cardiac stents (1 DES prior to CABG) and CABG (2008)    Exercise tolerance: >4 METS  Comments: EF 57% on Echo 06-29-15,  AICD no discharges   GI/Hepatic/Renal     GERD: well controlled           Endo/Other    Diabetes: well controlled, type 2  Hypothyroidism: well controlled       Other Findings            Physical Exam    Airway  Mallampati: II  TM Distance: < 4 cm  Neck ROM: normal range of motion   Mouth opening: Normal     Cardiovascular  Regular rate and rhythm,  S1 and S2 normal,  no murmur, click, rub, or gallop             Dental  No notable dental hx       Pulmonary  Breath sounds clear to auscultation               Abdominal  GI exam deferred       Other Findings            Anesthetic Plan    ASA: 3  Anesthesia type: total IV anesthesia      Post-op pain plan if not by surgeon: peripheral nerve block single    Induction: Intravenous  Anesthetic plan and risks discussed with: Patient and Spouse

## 2015-10-22 NOTE — Progress Notes (Signed)
PT received RX for CPM s/p manipulation left TKA. CPM applied & set +5 deg hyperextension & 110 deg flex. Pt measured -4 & 110 deg in unit. Pt seemed to tolerate speed & settings without difficulty. PT will follow up in pm to assess & exercise. Hendricks LimesPhilip Ingegneri, PT, CES

## 2015-10-22 NOTE — Progress Notes (Signed)
TRANSFER - IN REPORT:    [facility]  (name) on Christoper FabianJames Lahman  being received from PACU (unit) for routine progression of care      Report consisted of patient???s Situation, Background, Assessment and   Recommendations(SBAR).     Information from the following report(s) SBAR, Kardex, Intake/Output, MAR and Recent Results. was reviewed with the receiving nurse.    Opportunity for questions and clarification was provided.      Assessment will be completed upon patient???s arrival to unit and care assumed.

## 2015-10-22 NOTE — Progress Notes (Signed)
10/22/15 1305   Oxygen Therapy   O2 Sat (%) 97 %   Pulse via Oximetry 74 beats per minute   O2 Device Room air   Patient brought home cpap unit.

## 2015-10-22 NOTE — Other (Signed)
TRANSFER - OUT REPORT:    Verbal report given to Darl PikesSusan RN on Christoper FabianJames Mathia  being transferred to ortho for routine progression of care       Report consisted of patient???s Situation, Background, Assessment and   Recommendations(SBAR).     Information from the following report(s) SBAR was reviewed with the receiving nurse.    Lines:   Peripheral IV 10/22/15 Right Hand (Active)   Site Assessment Clean, dry, & intact 10/22/2015  8:09 AM   Phlebitis Assessment 0 10/22/2015  8:09 AM   Infiltration Assessment 0 10/22/2015  8:09 AM   Dressing Status Clean, dry, & intact 10/22/2015  8:09 AM   Dressing Type Tape;Transparent 10/22/2015  8:09 AM   Hub Color/Line Status Green;Infusing 10/22/2015  8:09 AM   Alcohol Cap Used No 10/22/2015  6:00 AM        Opportunity for questions and clarification was provided.      Patient transported with:   O2 @ 2 liters

## 2015-10-22 NOTE — Progress Notes (Signed)
Pt requested to have CPM placed on.  No difficulties observed.

## 2015-10-22 NOTE — Anesthesia Post-Procedure Evaluation (Signed)
Post-Anesthesia Evaluation and Assessment    Patient: Aaron Floyd MRN: [id number]  SSN: [id number]    Date of Birth: 04/30/1946  Age: 70 y.o.  Sex: male       Cardiovascular Function/Vital Signs  Visit Vitals   ??? BP 156/81   ??? Pulse 67   ??? Temp 36.7 ??C (98 ??F)   ??? Resp 16   ??? Ht 5\' 10"  (1.778 m)   ??? Wt 69.4 kg (153 lb)   ??? SpO2 99%   ??? BMI 21.95 kg/m2       Patient is status post total IV anesthesia anesthesia for Procedure(s):  MANIPULATION LEFT KNEE.    Nausea/Vomiting: None    Postoperative hydration reviewed and adequate.    Pain:  Pain Scale 1: Visual (10/22/15 0736)  Pain Intensity 1: 0 (10/22/15 0736)   Managed    Neurological Status:   Neuro (WDL): Exceptions to WDL (10/22/15 0736)  Neuro  LUE Motor Response: Purposeful (10/22/15 0736)  LLE Motor Response: Numbness (10/22/15 0736)  RUE Motor Response: Purposeful (10/22/15 0736)  RLE Motor Response: Purposeful (10/22/15 0736)     Mental Status and Level of Consciousness: Awake.    Pulmonary Status:   O2 Device: Nasal cannula (10/22/15 0751)   Adequate oxygenation and airway patent    Complications related to anesthesia: None    Post-anesthesia assessment completed. No concerns    Signed By: Nehemiah SettleJonathan R Tine Mabee, MD     Oct 22, 2015

## 2015-10-22 NOTE — Progress Notes (Signed)
Checked on pt again this pm & pt was still too numb to safely attempt out of bed activity. CPM was re-applied at same settings & speed with no major difficulties observed. PT to follow up in am.  Hendricks LimesPhilip Ingegneri, PT, CES

## 2015-10-22 NOTE — Progress Notes (Signed)
Patient is alert and oriented x4. Able to verbalize needs. Resting quietly with no distress noted. Neurovascular and peripheral vascular checks WNL. Voiding clear yellow urine.  No c/o pain at this time.  Bed low and locked. Call light within reach. Instructed to call for assistance. Patient verbalizes understanding. Will monitor.

## 2015-10-22 NOTE — H&P (Signed)
[facility]  Pre Operative History and Physical Exam    Patient ID:  Aaron Floyd  [id number]  70 y.o.  10/12/1945    Today: Oct 22, 2015       Assessment:   1. Stiff left TKA      Plan:    1. Proceed with scheduled Procedure(s) (LRB):  MANIPULATION LEFT KNEE (Left)            CC:  Left knee pain and stiffness    HPI:   The patient has stiff left TKA. The patient was evaluated and examined during a consultation prior to this office visit.  There have been no changes to the patient's orthopedic condition since the initial consultation. The patient has failed to achieve good ROM after left TKA. The necessity for knee manipulation is present. The patient will be admitted the day of surgery for Procedure(s) (LRB):  MANIPULATION LEFT KNEE (Left)      Past Medical/Surgical History:  Past Medical History:   Diagnosis Date   ??? Arrhythmia     pacemaker/defibrillator; denies shocks   ??? Arthritis    ??? CAD (coronary artery disease)     CABG, 1 stent   ??? Diabetes (HCC)     type 2; oral meds; checks bs occasionally; pt reports most recent  A1C 6.2   ??? Former smoker    ??? GERD (gastroesophageal reflux disease)    ??? Hypertension     on med for control    ??? Ischemic cardiomyopathy     hx per cardiologist note 05-11-15   ??? Nausea & vomiting     nausea post-op   ??? RLS (restless legs syndrome)    ??? Sepsis [facility](HCC)     June 2016   ??? Sleep apnea     c-pap   ??? 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 Thyroid disease     hypothyroidism     Past Surgical History:   Procedure Laterality Date   ??? CARDIAC SURG PROCEDURE UNLIST  01/22/2007    cabg    ??? CARDIAC SURG PROCEDURE UNLIST  2002    stent- no longer active after CABG   ??? HX HEENT  1950    tonsillectomy   ??? HX KNEE REPLACEMENT Left 07/02/2015   ??? HX PACEMAKER  2010    st jude /defibrillator/pacemaker        Allergies:   Allergies   Allergen Reactions   ??? Shellfish Derived Hives     Hives/ tolerates contrast dye without issues        Objective:                     HEENT: NC/AT                   Lungs:  Unlabored respirations, clear breath sounds                    Heart:   RRR                    Abdomen: soft                   Extremities:  Pain and stiffness with ROM of the left knee    Meds:   Current Facility-Administered Medications   Medication Dose Route Frequency   ??? lidocaine (XYLOCAINE) 10 mg/mL (1 %) injection 0.1 mL  0.1 mL SubCUTAneous PRN   ???  lactated ringers infusion  100 mL/hr IntraVENous CONTINUOUS   ??? fentaNYL citrate (PF) injection 100 mcg  100 mcg IntraVENous ONCE   ??? midazolam (VERSED) injection 2 mg  2 mg IntraVENous ONCE PRN         Labs:  Admission on 10/22/2015   Component Date Value Ref Range Status   ??? Glucose (POC) 10/22/2015 174* 65 - 100 mg/dL Final    Comment: 47 - 60 mg/dl Consistent with, but not fully diagnostic of hypoglycemia.  101 - 125 mg/dl Impaired fasting glucose/consistent with pre-diabetes mellitus  > 126 mg/dl Fasting glucose consistent with overt diabetes mellitus     Hospital Outpatient Visit on 10/18/2015   Component Date Value Ref Range Status   ??? Glucose (POC) 10/18/2015 207* 65 - 100 mg/dL Final    Comment: 47 - 60 mg/dl Consistent with, but not fully diagnostic of hypoglycemia.  101 - 125 mg/dl Impaired fasting glucose/consistent with pre-diabetes mellitus  > 126 mg/dl Fasting glucose consistent with overt diabetes mellitus                   Patient Active Problem List   Diagnosis Code   ??? Status post total left knee replacement Z61.096         Signed By: Kathee Polite., MD  Oct 22, 2015

## 2015-10-22 NOTE — Anesthesia Procedure Notes (Signed)
Peripheral Block    Start time: 10/22/2015 7:07 AM  End time: 10/22/2015 7:13 AM  Performed by: Jeremaih Klima R  Authorized by: Yana Schorr R       Pre-procedure:   Indications: at surgeon's request, post-op pain management and procedure for pain    Preanesthetic Checklist: patient identified, risks and benefits discussed, site marked, timeout performed, anesthesia consent given and patient being monitored    Timeout Time: 07:06          Block Type:   Block Type:  Femoral single shot  Monitoring:  Standard ASA monitoring, continuous pulse ox, frequent vital sign checks, heart rate, oxygen and responsive to questions  Injection Technique:  Single shot  Procedures: ultrasound guided and nerve stimulator    Prep: chlorhexidine    Needle Type:  Stimuplex  Needle Gauge:  20 G  Needle Localization:  Ultrasound guidance and nerve stimulator  Motor Response: minimal motor response >0.4 mA    Medication Injected:  0.375%  ropivacaine  Adds:  Epi 1:400K  Volume (mL):  30    Assessment:  Number of attempts:  1  Injection Assessment:  Incremental injection every 5 mL, local visualized surrounding nerve on ultrasound, negative aspiration for blood, no paresthesia, no intravascular symptoms and ultrasound image on chart  Patient tolerance:  Patient tolerated the procedure well with no immediate complications  3 cc 1% lidocaine injected at site of needle insertion.

## 2015-10-22 NOTE — Anesthesia Procedure Notes (Signed)
Peripheral Block    Start time: 10/22/2015 7:01 AM  End time: 10/22/2015 7:06 AM  Performed by: Jatorian Renault R  Authorized by: Shaarav Ripple R       Pre-procedure:   Indications: at surgeon's request, post-op pain management and procedure for pain    Preanesthetic Checklist: patient identified, risks and benefits discussed, site marked, timeout performed, anesthesia consent given and patient being monitored    Timeout Time: 07:00          Block Type:   Block Type:  Popliteal  Laterality:  Left  Monitoring:  Standard ASA monitoring, continuous pulse ox, frequent vital sign checks, heart rate, oxygen and responsive to questions  Injection Technique:  Single shot  Procedures: ultrasound guided and nerve stimulator    Prep: chlorhexidine    Needle Type:  Stimuplex  Needle Gauge:  20 G  Needle Localization:  Ultrasound guidance and nerve stimulator  Motor Response: minimal motor response >0.4 mA    Medication Injected:  0.375%  ropivacaine  Adds:  Epi 1:400K  Volume (mL):  30    Assessment:  Number of attempts:  1  Injection Assessment:  Incremental injection every 5 mL, local visualized surrounding nerve on ultrasound, negative aspiration for blood, no paresthesia, no intravascular symptoms and ultrasound image on chart  Patient tolerance:  Patient tolerated the procedure well with no immediate complications  3 cc 1% lidocaine injected at site of needle insertion.

## 2015-10-22 NOTE — Anesthesia Procedure Notes (Signed)
Peripheral Block    Start time: 10/22/2015 7:01 AM  End time: 10/22/2015 7:06 AM  Performed by: Nehemiah SettleHERTY, Santonio Speakman R  Authorized by: Nehemiah SettleHERTY, Kyira Volkert R       Pre-procedure:   Indications: at surgeon's request, post-op pain management and procedure for pain    Preanesthetic Checklist: patient identified, risks and benefits discussed, site marked, timeout performed, anesthesia consent given and patient being monitored    Timeout Time: 07:00          Block Type:   Block Type:  Popliteal  Laterality:  Left  Monitoring:  Standard ASA monitoring, continuous pulse ox, frequent vital sign checks, heart rate, oxygen and responsive to questions  Injection Technique:  Single shot  Procedures: ultrasound guided and nerve stimulator    Prep: chlorhexidine    Needle Type:  Stimuplex  Needle Gauge:  20 G  Needle Localization:  Ultrasound guidance and nerve stimulator  Motor Response: minimal motor response >0.4 mA    Medication Injected:  0.375%  ropivacaine  Adds:  Epi 1:400K  Volume (mL):  30    Assessment:  Number of attempts:  1  Injection Assessment:  Incremental injection every 5 mL, local visualized surrounding nerve on ultrasound, negative aspiration for blood, no paresthesia, no intravascular symptoms and ultrasound image on chart  Patient tolerance:  Patient tolerated the procedure well with no immediate complications  3 cc 1% lidocaine injected at site of needle insertion.

## 2015-10-23 MED FILL — ATORVASTATIN 40 MG TAB: 40 mg | ORAL | Qty: 1

## 2015-10-23 MED FILL — HYDRALAZINE 25 MG TAB: 25 mg | ORAL | Qty: 1

## 2015-10-23 MED FILL — MAPAP EXTRA STRENGTH 500 MG TABLET: 500 mg | ORAL | Qty: 2

## 2015-10-23 MED FILL — VITAMIN D3 25 MCG (1,000 UNIT) TABLET: 25 mcg (1,000 unit) | ORAL | Qty: 2

## 2015-10-23 MED FILL — ASPIRIN 325 MG TAB: 325 mg | ORAL | Qty: 1

## 2015-10-23 MED FILL — CELECOXIB 200 MG CAP: 200 mg | ORAL | Qty: 1

## 2015-10-23 MED FILL — PRAMIPEXOLE 0.25 MG TAB: 0.25 mg | ORAL | Qty: 1

## 2015-10-23 MED FILL — PANTOPRAZOLE 40 MG TAB, DELAYED RELEASE: 40 mg | ORAL | Qty: 1

## 2015-10-23 MED FILL — FOLIC ACID 1 MG TAB: 1 mg | ORAL | Qty: 1

## 2015-10-23 MED FILL — LEVOTHROID 50 MCG TABLET: 50 mcg | ORAL | Qty: 1

## 2015-10-23 MED FILL — CARVEDILOL 25 MG TAB: 25 mg | ORAL | Qty: 1

## 2015-10-23 MED FILL — JANUVIA 50 MG TABLET: 50 mg | ORAL | Qty: 1

## 2015-10-23 MED FILL — SENNA PLUS 8.6 MG-50 MG TABLET: ORAL | Qty: 2

## 2015-10-23 MED FILL — SERTRALINE 25 MG TAB: 25 mg | ORAL | Qty: 1

## 2015-10-23 NOTE — Op Note (Signed)
Op Notes by Kathee PoliteJennings, Niyam Bisping E Jr., MD at 10/23/15 [phone]                Author: Kathee PoliteJennings, Ronica Vivian E Jr., MD  Service: Orthopedic Surgery  Author Type: Physician       Filed: 10/30/15 0852  Date of Service: 10/23/15 0943  Status: Signed          Editor: Kathee PoliteJennings, Reiley Bertagnolli E Jr., MD (Physician)               [facility]   Closed Manipulation of Total Knee Arthroplasty      Patient:Aaron Floyd    DOB: 08/21/1945   Medical Record Number:[id number]   Pre-operative Diagnosis:  Stiffness of left knee [M25.662]    Post-operative Diagnosis: Stiffness of left knee [M25.662]   [facility]   Surgeon: Gardiner FantiJames Jennings, MD      Anesthesia: General   EBL: none      Procedure: The patient was brought to the PACU. A femoral nerve  block was administered. The patient the received general anesthesia after a timeout was done. A closed manipulation was performed on the left Knee.  Preoperatively the ROM was 5 to 95 degrees. The knee was manipulated. Upon completion the ROM was 0 to  120 degrees. Ligaments were stable. Extensor mechanism was intact. Ice was applied to the knee. The patient tolerated the procedure w/o difficulty               Signed By: Gardiner FantiJames Jennings, MD   10/30/2015,  8:52 AM

## 2015-10-23 NOTE — Progress Notes (Signed)
Patient unable to be seen yesterday (day of surgery) for therapy evaluation following manipulation secondary to numbness.  He, however, was set up with CPM.  Patient has an outpatient [facility] at 1:00.  Because patient is an observation patient, he will not be evaluated while in the hospital since he cannot have 2 evaluations on the same day.  Spoke with patient about this and he was in agreement.  Patient and RN report he is moving around well within the room and has no needs prior to discharge.      Toniann Failracey Nam Vossler, PT

## 2015-10-23 NOTE — Other (Signed)
No anesthesia complications

## 2015-10-23 NOTE — Progress Notes (Signed)
Patient observation status.  Patient left hospital this morning prior to being given MOON letter. Overton MamLaura Charles

## 2015-10-23 NOTE — Progress Notes (Signed)
Oct 23, 2015         Post Op day: 1 Day Post-OpProcedure(s) (LRB):  MANIPULATION LEFT KNEE (Left)      Admit Date: 10/22/2015  Admit Diagnosis: Stiffness of left knee [M25.662]    LAB:    No results found for this or any previous visit (from the past 24 hour(s)).  Vital Signs:    Patient Vitals for the past 8 hrs:   BP Temp Pulse Resp SpO2   10/23/15 0500 (!) 150/93 97.6 ??F (36.4 ??C) 74 18 98 %     Temp (24hrs), Avg:97.1 ??F (36.2 ??C), Min:96.3 ??F (35.7 ??C), Max:97.6 ??F (36.4 ??C)    Body mass index is 21.95 kg/(m^2).  Pain Control:   Pain Assessment  Pain Scale 1: Numeric (0 - 10)  Pain Intensity 1: 0  Pain Onset 1: at rest  Pain Location 1: Knee  Pain Orientation 1: Left  Pain Description 1: Aching, Constant  Pain Intervention(s) 1: Medication (see MAR)    Subjective: Doing well, No complaints, No SOB, No Chest Pain, No Nausea or Vomitting     Objective: Vital Signs are Stable, No Acute Distress, Alert and Oriented, Dressing is Dry,  Neurovascular exam is normal.       PT/OT:            Assistive Device: Walker (comment)                Weight Bearing Status: WBAT    Meds:  @RRSCHMED @  @RRIVMEDS @  @RRPRNMEDS @    Assessment:   Patient Active Problem List   Diagnosis Code   ??? 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 Stiffness of left knee M25.662             Plan: Continue Physical Therapy, Monitor  Hbg, discharge home today.        Signed By: Kathee PoliteJames E Jennings Jr., MD

## 2015-10-23 NOTE — H&P (Signed)
[facility]  Closed Manipulation of Total Knee Arthroplasty    Patient:Aaron Floyd   DOB: 08/18/1945  Medical Record Number:[id number]  Pre-operative Diagnosis:  Stiffness of left knee [M25.662]   Post-operative Diagnosis: Stiffness of left knee [M25.662]  [facility]  Surgeon: Gardiner FantiJames Jennings, MD    Anesthesia: General  EBL: none    Procedure: The patient was brought to the PACU. A femoral nerve block was administered. The patient the received general anesthesia after a timeout was done. A closed manipulation was performed on the Left Knee.  Preoperatively the ROM was 5 to 105 degrees. The knee was manipulated. Upon completion the ROM was 0 to 120 degrees. Ligaments were stable. Extensor mechanism was intact. Ice was applied to the knee. The patient tolerated the procedure w/o difficulty         Signed By: Gardiner FantiJames Jennings, MD  10/23/2015,  7:57 AM

## 2015-10-23 NOTE — Op Note (Signed)
[facility]  Closed Manipulation of Total Knee Arthroplasty    Patient:Aaron Floyd Aaron Floyd   DOB: 11/16/1945  Medical Record Number:[id number]  Pre-operative Diagnosis:  Stiffness of left knee [M25.662]   Post-operative Diagnosis: Stiffness of left knee [M25.662]  [facility]  Surgeon: Gardiner FantiJames Jennings, MD    Anesthesia: General  EBL: none    Procedure: The patient was brought to the PACU. A femoral nerve block was administered. The patient the received general anesthesia after a timeout was done. A closed manipulation was performed on the left Knee.  Preoperatively the ROM was 5 to 95 degrees. The knee was manipulated. Upon completion the ROM was 0 to 120 degrees. Ligaments were stable. Extensor mechanism was intact. Ice was applied to the knee. The patient tolerated the procedure w/o difficulty         Signed By: Gardiner FantiJames Jennings, MD  10/30/2015,  8:52 AM

## 2015-10-23 NOTE — Progress Notes (Signed)
I have reviewed discharge instructions with the patient and spouse.  The patient and spouse verbalized understanding.

## 2016-02-26 ENCOUNTER — Inpatient Hospital Stay: Admit: 2016-02-26 | Primary: Family Medicine

## 2016-07-11 DIAGNOSIS — E119 Type 2 diabetes mellitus without complications: Secondary | ICD-10-CM | POA: Diagnosis not present

## 2016-07-17 DIAGNOSIS — I255 Ischemic cardiomyopathy: Secondary | ICD-10-CM | POA: Diagnosis not present

## 2016-07-17 DIAGNOSIS — E7800 Pure hypercholesterolemia, unspecified: Secondary | ICD-10-CM | POA: Diagnosis not present

## 2016-07-17 DIAGNOSIS — I25119 Atherosclerotic heart disease of native coronary artery with unspecified angina pectoris: Secondary | ICD-10-CM | POA: Diagnosis not present

## 2016-07-17 DIAGNOSIS — I10 Essential (primary) hypertension: Secondary | ICD-10-CM | POA: Diagnosis not present

## 2016-09-15 DIAGNOSIS — Z95810 Presence of automatic (implantable) cardiac defibrillator: Secondary | ICD-10-CM | POA: Diagnosis not present

## 2016-10-09 DIAGNOSIS — E119 Type 2 diabetes mellitus without complications: Secondary | ICD-10-CM | POA: Diagnosis not present

## 2016-11-10 DIAGNOSIS — Z125 Encounter for screening for malignant neoplasm of prostate: Secondary | ICD-10-CM | POA: Diagnosis not present

## 2016-11-10 DIAGNOSIS — G2581 Restless legs syndrome: Secondary | ICD-10-CM | POA: Diagnosis not present

## 2016-11-10 DIAGNOSIS — L570 Actinic keratosis: Secondary | ICD-10-CM | POA: Diagnosis not present

## 2016-11-10 DIAGNOSIS — E119 Type 2 diabetes mellitus without complications: Secondary | ICD-10-CM | POA: Diagnosis not present

## 2016-11-10 DIAGNOSIS — I10 Essential (primary) hypertension: Secondary | ICD-10-CM | POA: Diagnosis not present

## 2017-01-25 DIAGNOSIS — Z23 Encounter for immunization: Secondary | ICD-10-CM | POA: Diagnosis not present

## 2017-02-02 DIAGNOSIS — Z95810 Presence of automatic (implantable) cardiac defibrillator: Secondary | ICD-10-CM | POA: Diagnosis not present

## 2017-04-06 DIAGNOSIS — E119 Type 2 diabetes mellitus without complications: Secondary | ICD-10-CM | POA: Diagnosis not present

## 2017-05-05 DIAGNOSIS — E782 Mixed hyperlipidemia: Secondary | ICD-10-CM | POA: Diagnosis not present

## 2017-05-05 DIAGNOSIS — E119 Type 2 diabetes mellitus without complications: Secondary | ICD-10-CM | POA: Diagnosis not present

## 2017-05-05 DIAGNOSIS — Z9189 Other specified personal risk factors, not elsewhere classified: Secondary | ICD-10-CM | POA: Diagnosis not present

## 2017-05-05 DIAGNOSIS — Z1159 Encounter for screening for other viral diseases: Secondary | ICD-10-CM | POA: Diagnosis not present

## 2017-05-05 DIAGNOSIS — K219 Gastro-esophageal reflux disease without esophagitis: Secondary | ICD-10-CM | POA: Diagnosis not present

## 2017-05-05 DIAGNOSIS — Z0000 Encounter for general adult medical examination without abnormal findings: Secondary | ICD-10-CM | POA: Diagnosis not present

## 2017-05-25 DIAGNOSIS — Z95810 Presence of automatic (implantable) cardiac defibrillator: Secondary | ICD-10-CM | POA: Diagnosis not present

## 2017-05-25 DIAGNOSIS — I255 Ischemic cardiomyopathy: Secondary | ICD-10-CM | POA: Diagnosis not present

## 2017-05-25 DIAGNOSIS — I447 Left bundle-branch block, unspecified: Secondary | ICD-10-CM | POA: Diagnosis not present

## 2017-06-08 DIAGNOSIS — H01004 Unspecified blepharitis left upper eyelid: Secondary | ICD-10-CM | POA: Diagnosis not present

## 2017-06-11 DIAGNOSIS — H00014 Hordeolum externum left upper eyelid: Secondary | ICD-10-CM | POA: Diagnosis not present

## 2017-07-10 DIAGNOSIS — N200 Calculus of kidney: Secondary | ICD-10-CM | POA: Diagnosis not present

## 2017-07-10 DIAGNOSIS — Z87442 Personal history of urinary calculi: Secondary | ICD-10-CM | POA: Diagnosis not present

## 2017-07-13 DIAGNOSIS — E782 Mixed hyperlipidemia: Secondary | ICD-10-CM | POA: Diagnosis not present

## 2017-07-13 DIAGNOSIS — I10 Essential (primary) hypertension: Secondary | ICD-10-CM | POA: Diagnosis not present

## 2017-07-13 DIAGNOSIS — E119 Type 2 diabetes mellitus without complications: Secondary | ICD-10-CM | POA: Diagnosis not present

## 2017-07-22 DIAGNOSIS — Z95810 Presence of automatic (implantable) cardiac defibrillator: Secondary | ICD-10-CM | POA: Diagnosis not present

## 2017-07-22 DIAGNOSIS — I160 Hypertensive urgency: Secondary | ICD-10-CM | POA: Diagnosis not present

## 2017-07-22 DIAGNOSIS — I25119 Atherosclerotic heart disease of native coronary artery with unspecified angina pectoris: Secondary | ICD-10-CM | POA: Diagnosis not present

## 2017-07-22 DIAGNOSIS — I255 Ischemic cardiomyopathy: Secondary | ICD-10-CM | POA: Diagnosis not present

## 2017-07-22 DIAGNOSIS — E7800 Pure hypercholesterolemia, unspecified: Secondary | ICD-10-CM | POA: Diagnosis not present

## 2017-08-24 DIAGNOSIS — Z95810 Presence of automatic (implantable) cardiac defibrillator: Secondary | ICD-10-CM | POA: Diagnosis not present

## 2017-10-30 DIAGNOSIS — H25813 Combined forms of age-related cataract, bilateral: Secondary | ICD-10-CM | POA: Diagnosis not present

## 2017-10-30 DIAGNOSIS — E119 Type 2 diabetes mellitus without complications: Secondary | ICD-10-CM | POA: Diagnosis not present

## 2017-11-30 DIAGNOSIS — Z95810 Presence of automatic (implantable) cardiac defibrillator: Secondary | ICD-10-CM | POA: Diagnosis not present

## 2018-01-25 DIAGNOSIS — K219 Gastro-esophageal reflux disease without esophagitis: Secondary | ICD-10-CM | POA: Diagnosis not present

## 2018-01-25 DIAGNOSIS — E7800 Pure hypercholesterolemia, unspecified: Secondary | ICD-10-CM | POA: Diagnosis not present

## 2018-01-25 DIAGNOSIS — E1159 Type 2 diabetes mellitus with other circulatory complications: Secondary | ICD-10-CM | POA: Diagnosis not present

## 2018-01-25 DIAGNOSIS — E039 Hypothyroidism, unspecified: Secondary | ICD-10-CM | POA: Diagnosis not present

## 2018-01-25 DIAGNOSIS — E559 Vitamin D deficiency, unspecified: Secondary | ICD-10-CM | POA: Diagnosis not present

## 2018-01-25 DIAGNOSIS — Z95810 Presence of automatic (implantable) cardiac defibrillator: Secondary | ICD-10-CM | POA: Diagnosis not present

## 2018-01-25 DIAGNOSIS — I509 Heart failure, unspecified: Secondary | ICD-10-CM | POA: Diagnosis not present

## 2018-01-25 DIAGNOSIS — I2510 Atherosclerotic heart disease of native coronary artery without angina pectoris: Secondary | ICD-10-CM | POA: Diagnosis not present

## 2018-01-25 DIAGNOSIS — G2581 Restless legs syndrome: Secondary | ICD-10-CM | POA: Diagnosis not present

## 2018-01-25 DIAGNOSIS — I10 Essential (primary) hypertension: Secondary | ICD-10-CM | POA: Diagnosis not present

## 2018-01-26 ENCOUNTER — Ambulatory Visit (INDEPENDENT_AMBULATORY_CARE_PROVIDER_SITE_OTHER): Payer: Medicare Other | Admitting: Internal Medicine

## 2018-01-26 ENCOUNTER — Encounter: Payer: Self-pay | Admitting: Internal Medicine

## 2018-01-26 ENCOUNTER — Encounter (INDEPENDENT_AMBULATORY_CARE_PROVIDER_SITE_OTHER): Payer: Self-pay

## 2018-01-26 VITALS — BP 124/76 | HR 64 | Ht 70.0 in | Wt 187.6 lb

## 2018-01-26 DIAGNOSIS — I425 Other restrictive cardiomyopathy: Secondary | ICD-10-CM | POA: Diagnosis not present

## 2018-01-26 DIAGNOSIS — Z95810 Presence of automatic (implantable) cardiac defibrillator: Secondary | ICD-10-CM

## 2018-01-26 DIAGNOSIS — I10 Essential (primary) hypertension: Secondary | ICD-10-CM

## 2018-01-26 NOTE — Progress Notes (Signed)
Patient Care Team: Orpah Melter, MD as PCP - General (Family Medicine)   HPI  Todd [name] is a 72 y.o. male seen to reestablish device follow-up.  He underwent CRT-ICD implantation in 2010 for primary prevention.  His ejection fraction at that time was about 20%. He has a history of coronary artery disease with prior stenting and and single-vessel CABG.   Under went generator replacement 11/17.  Ejection fraction about 6 months ago was 60-65% and has been in the near normal range for most of the time post implant.  He is tolerating his medications without difficulties.  He has lost 40 pounds.  He has previously CPAP treated sleep apnea but no longer snores.  He assures me he will be my best patients.     Past Medical History:  Diagnosis Date  . Coronary artery disease    Status post CABG  . Diabetes mellitus   . Hyperlipidemia   . ICD-St.Jude-CRT    Implanted 2010  . Other primary cardiomyopathies   . Syncope, vasovagal     Past Surgical History:  Procedure Laterality Date  . CORONARY STENT PLACEMENT      Current Outpatient Medications  Medication Sig Dispense Refill  . Alogliptin Benzoate 25 MG TABS Take 1 tablet by mouth daily.    Marland Kitchen amLODipine (NORVASC) 5 MG tablet Take 5 mg by mouth daily.    Marland Kitchen atorvastatin (LIPITOR) 40 MG tablet Take 40 mg by mouth daily.    . baclofen (LIORESAL) 10 MG tablet Take 10 mg by mouth 3 (three) times daily.    . carvedilol (COREG) 25 MG tablet Take 25 mg by mouth 2 (two) times daily with a meal.    . cyanocobalamin 2000 MCG tablet Take 2,000 mcg by mouth daily.    . empagliflozin (JARDIANCE) 25 MG TABS tablet Take 25 mg by mouth daily.    Marland Kitchen esomeprazole (NEXIUM) 40 MG capsule Take 40 mg by mouth daily before breakfast.    . FOLIC ACID PO Take 1 tablet by mouth daily.    Marland Kitchen levothyroxine (SYNTHROID, LEVOTHROID) 50 MCG tablet Take 50 mcg by mouth daily.    Marland Kitchen losartan (COZAAR) 100 MG tablet Take 100 mg by mouth daily.    . metFORMIN  (GLUCOPHAGE) 1000 MG tablet Take 1,000 mg by mouth 2 (two) times daily with a meal.    . Multiple Vitamin (MULTIVITAMIN) capsule Take 1 capsule by mouth daily.    . Omega-3 Fatty Acids (FISH OIL PO) Take 1 tablet by mouth daily.    . pramipexole (MIRAPEX) 0.25 MG tablet Take 0.25 mg by mouth daily.     No current facility-administered medications for this visit.     Allergies  Allergen Reactions  . Shellfish Allergy    Social History   Tobacco Use  . Smoking status: Former Smoker    Last attempt to quit: 1969    Years since quitting: 50.6  . Smokeless tobacco: Never Used  Substance Use Topics  . Alcohol use: Not Currently  . Drug use: Never    Family History  Problem Relation Age of Onset  . Diabetes Sister   . Hyperlipidemia Sister   . Heart disease Brother   . Hypertension Brother   . Hyperlipidemia Brother   . Heart disease Brother   . Diabetes Brother   . Hypertension Brother   . Hyperlipidemia Brother   . Diabetes Brother   . Hypertension Brother   . Hyperlipidemia Brother      Review  of Systems negative except from HPI and PMH  Physical Exam BP 124/76   Pulse 64   Ht 5\' 10"  (1.778 m)   Wt 187 lb 9.6 oz (85.1 kg)   SpO2 97%   BMI 26.92 kg/m   Well developed and nourished in no acute distress HENT normal Neck supple with JVP-flat Clear Device pocket well healed; without hematoma or erythema.  There is no tethering  Regular rate and rhythm, no murmurs or gallops Abd-soft with active BS No Clubbing cyanosis edema Skin-warm and dry A & Oriented  Grossly normal sensory and motor function   P-synchronous/ AV  Pacing QRS morphology was negative in lead I and rS in lead V1   Assessment and  Plan Ischemic cardiomyopathy status post CABG  CRT-D-Saint Jude  Hypertension  Diabetes   The patient is doing extraordinarily well.  We have data supporting maintaining of his afterload reduction stent normalization of LV function.  Interestingly, he also  has significant hypertension requiring multiple medications.  Device function is normal.  The question that I do not remember is what is the best target blood pressure for diabetes.  In the wake of a cord it was thought to be higher than 115's.  We will have to look into this.

## 2018-01-26 NOTE — Patient Instructions (Signed)
Medication Instructions:  Your physician recommends that you continue on your current medications as directed. Please refer to the Current Medication list given to you today.  Labwork: None ordered.  Testing/Procedures: None ordered.  Follow-Up: Your physician wants you to follow-up in: 13 months with Dr Caryl Comes. You will receive a reminder letter in the mail two months in advance. If you don't receive a letter, please call our office to schedule the follow-up appointment.  Remote monitoring is used to monitor your Pacemaker of ICD from home. This monitoring reduces the number of office visits required to check your device to one time per year. It allows [place] to keep an eye on the functioning of your device to ensure it is working properly. You are scheduled for a device check from home on 04/27/2018. You may send your transmission at any time that day. If you have a wireless device, the transmission will be sent automatically. After your physician reviews your transmission, you will receive a postcard with your next transmission date.   Any Other Special Instructions Will Be Listed Below (If Applicable).     If you need a refill on your cardiac medications before your next appointment, please call your pharmacy.

## 2018-01-27 LAB — CUP PACEART INCLINIC DEVICE CHECK
Battery Remaining Longevity: 61 mo
Brady Statistic RA Percent Paced: 6.5 %
Date Time Interrogation Session: 20190820190520
HighPow Impedance: 47.246
Implantable Lead Implant Date: 20101005
Implantable Lead Implant Date: 20101005
Implantable Lead Implant Date: 20101005
Implantable Lead Location: 753858
Implantable Lead Location: 753860
Implantable Pulse Generator Implant Date: 20171127
Lead Channel Impedance Value: 437.5 Ohm
Lead Channel Impedance Value: 512.5 Ohm
Lead Channel Impedance Value: 887.5 Ohm
Lead Channel Pacing Threshold Amplitude: 0.5 V
Lead Channel Pacing Threshold Amplitude: 0.75 V
Lead Channel Pacing Threshold Amplitude: 1.75 V
Lead Channel Pacing Threshold Pulse Width: 0.5 ms
Lead Channel Pacing Threshold Pulse Width: 0.5 ms
Lead Channel Pacing Threshold Pulse Width: 0.5 ms
Lead Channel Sensing Intrinsic Amplitude: 11.9 mV
Lead Channel Setting Pacing Amplitude: 2 V
Lead Channel Setting Pacing Pulse Width: 0.5 ms
Lead Channel Setting Sensing Sensitivity: 0.5 mV
MDC IDC LEAD LOCATION: 753859
MDC IDC MSMT LEADCHNL LV PACING THRESHOLD AMPLITUDE: 1.75 V
MDC IDC MSMT LEADCHNL LV PACING THRESHOLD PULSEWIDTH: 1 ms
MDC IDC MSMT LEADCHNL LV PACING THRESHOLD PULSEWIDTH: 1 ms
MDC IDC MSMT LEADCHNL RA PACING THRESHOLD AMPLITUDE: 0.5 V
MDC IDC MSMT LEADCHNL RA SENSING INTR AMPL: 2.2 mV
MDC IDC MSMT LEADCHNL RV PACING THRESHOLD AMPLITUDE: 0.75 V
MDC IDC MSMT LEADCHNL RV PACING THRESHOLD PULSEWIDTH: 0.5 ms
MDC IDC SET LEADCHNL LV PACING AMPLITUDE: 2.5 V
MDC IDC SET LEADCHNL LV PACING PULSEWIDTH: 1 ms
MDC IDC SET LEADCHNL RA PACING AMPLITUDE: 2 V
MDC IDC STAT BRADY RV PERCENT PACED: 99.96 %
Pulse Gen Serial Number: 7371595

## 2018-02-18 ENCOUNTER — Encounter: Payer: Self-pay | Admitting: Interventional Cardiology

## 2018-03-02 DIAGNOSIS — E785 Hyperlipidemia, unspecified: Secondary | ICD-10-CM | POA: Insufficient documentation

## 2018-03-02 NOTE — Progress Notes (Signed)
Cardiology Office Note:    Date:  03/03/2018   ID:  [name], DOB 03/01/1946, MRN [id number]  PCP:  Orpah Melter, MD  Cardiologist:  No primary care provider on file.   Referring MD: Seward Carol, MD   Chief Complaint  Patient presents with  . Congestive Heart Failure    History of Present Illness:    Todd [name] is a 72 y.o. 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 Seward Carol.  Mr. Todd [name] was previously taken care of by me prior to relocating to [facility].  He has now returned to [facility] and will be living here permanently.  He is retired.  He has lost a significant amount of weight.  He has no complaints.  His prior cardiac history as outlined above, including single-vessel bypass for LAD disease, nonischemic cardiomyopathy in the setting of left bundle branch block that normalized to EF greater than 60% with resynchronization and AICD therapy.  No medication side effects.  Exercises regularly.  Achieves greater than 150 minutes of aerobic activity per week.  Last hemoglobin A1c was 6.7..  Past Medical History:  Diagnosis Date  . Coronary artery disease    Status post CABG  . Diabetes mellitus   . Hyperlipidemia   . ICD-St.Jude-CRT    Implanted 2010  . Other primary cardiomyopathies   . Syncope, vasovagal     Past Surgical History:  Procedure Laterality Date  . CORONARY STENT PLACEMENT      Current Medications: Current Meds  Medication Sig  . Alogliptin Benzoate 25 MG TABS Take 1 tablet by mouth daily.  Marland Kitchen amLODipine (NORVASC) 5 MG tablet Take 5 mg by mouth daily.  Marland Kitchen atorvastatin (LIPITOR) 40 MG tablet Take 40 mg by mouth daily.  . baclofen (LIORESAL) 10 MG tablet Take 10 mg by mouth 3 (three) times daily.    . carvedilol (COREG) 25 MG tablet Take 25 mg by mouth 2 (two) times daily with a meal.  . cyanocobalamin 2000 MCG tablet Take 2,000 mcg by mouth daily.  . empagliflozin (JARDIANCE) 25 MG TABS tablet Take 25 mg by mouth daily.  Marland Kitchen esomeprazole (NEXIUM) 40 MG capsule Take 40 mg by mouth daily before breakfast.  . FOLIC ACID PO Take 1 tablet by mouth daily.  Marland Kitchen levothyroxine (SYNTHROID, LEVOTHROID) 50 MCG tablet Take 50 mcg by mouth daily.  Marland Kitchen losartan (COZAAR) 100 MG tablet Take 100 mg by mouth daily.  . metFORMIN (GLUCOPHAGE) 1000 MG tablet Take 1,000 mg by mouth 2 (two) times daily with a meal.  . Multiple Vitamin (MULTIVITAMIN) capsule Take 1 capsule by mouth daily.  . Omega-3 Fatty Acids (FISH OIL PO) Take 1 tablet by mouth daily.  . pramipexole (MIRAPEX) 0.25 MG tablet Take 0.25 mg by mouth daily.     Allergies:   Shellfish allergy   Social History   Socioeconomic History  . Marital status: Married    Spouse name: Not on file  . Number of children: Not on file  . Years of education: Not on file  . Highest education level: Not on file  Occupational History  . Not on file  Social Needs  . Financial resource strain: Not on file  . Food insecurity:    Worry: Not on file    Inability: Not on file  .  Transportation needs:    Medical: Not on file    Non-medical: Not on file  Tobacco Use  . Smoking status: Former Smoker    Last attempt to quit: 1969    Years since quitting: 50.7  . Smokeless tobacco: Never Used  Substance and Sexual Activity  . Alcohol use: Not Currently  . Drug use: Never  . Sexual activity: Not on file  Lifestyle  . Physical activity:    Days per week: Not on file    Minutes per session: Not on file  . Stress: Not on file  Relationships  . Social connections:    Talks on phone: Not on file    Gets together: Not on file    Attends religious service: Not on file    Active member of club or organization: Not on file    Attends meetings of clubs or  organizations: Not on file    Relationship status: Not on file  Other Topics Concern  . Not on file  Social History Narrative  . Not on file     Family History: The patient's family history includes Diabetes in his brother, brother, and sister; Heart disease in his brother and brother; Hyperlipidemia in his brother, brother, brother, and sister; Hypertension in his brother, brother, and brother.  ROS:   Please see the history of present illness.    Snores, has history of sleep apnea, but since weight loss he has decided independently to discontinue CPAP.  All other systems reviewed and are negative.  EKGs/Labs/Other Studies Reviewed:    The following studies were reviewed today: No new data   EKG:  EKG is  ordered today.  The ekg recently performed on 01/26/2018 reveals atrial tracking with ventricular pacing.  Recent Labs: No results found for requested labs within last 8760 hours.  Recent Lipid Panel No results found for: CHOL, TRIG, HDL, CHOLHDL, VLDL, LDLCALC, LDLDIRECT  Physical Exam:    VS:  BP 132/76   Pulse 65   Ht 5\' 10"  (1.778 m)   Wt 185 lb 6.4 oz (84.1 kg)   BMI 26.60 kg/m     Wt Readings from Last 3 Encounters:  03/03/18 185 lb 6.4 oz (84.1 kg)  01/26/18 187 lb 9.6 oz (85.1 kg)  11/09/12 203 lb (92.1 kg)     GEN: Appears younger than stated age.  Well nourished, well developed in no acute distress HEENT: Normal NECK: No JVD. LYMPHATICS: No lymphadenopathy CARDIAC: RRR, no murmur, no gallop, no edema. VASCULAR: 2+ bilateral radial and carotid pulses.  No bruits. RESPIRATORY:  Clear to auscultation without rales, wheezing or rhonchi  ABDOMEN: Soft, non-tender, non-distended, No pulsatile mass, MUSCULOSKELETAL: No deformity  SKIN: Warm and dry NEUROLOGIC:  Alert and oriented x 3 PSYCHIATRIC:  Normal affect   ASSESSMENT:    1. Chronic diastolic heart failure ([place])   2. Coronary artery disease involving coronary bypass graft of native heart with  angina pectoris ([place])   3. Essential hypertension   4. Type 2 diabetes mellitus with other circulatory complication, without long-term current use of insulin (H[facility])   5. Other hyperlipidemia   6. Biventricular defibrillator-St. Jude    PLAN:    In order of problems listed above:  1. Apparently has recovered LVEF to greater than 50%.  We will get records from the [place] where the last echo was performed. 2. Single LIMA to LAD without anginal symptoms.  Is diabetic and at risk for other disease going forward.  We discussed secondary  risk modification to avoid recurrent ischemic events: Hemoglobin A1c less than 7, moderate aerobic activity 150 minutes/week, LDL cholesterol less than 70. 3. Target blood pressure 130/80 mmHg or less. 4. Not addressed other than discussing importance of target therapy to keep A1c less than 7.  Already on an SGL T2, empagliflozin. 5. Continue high intensity statin therapy.  Clinical follow-up in 1 year.  Medication adjustment as needed to keep metrics controlled.   Medication Adjustments/Labs and Tests Ordered: Current medicines are reviewed at length with the patient today.  Concerns regarding medicines are outlined above.  No orders of the defined types were placed in this encounter.  No orders of the defined types were placed in this encounter.   Patient Instructions  Medication Instructions:  Your physician recommends that you continue on your current medications as directed. Please refer to the Current Medication list given to you today.   Labwork: None ordered  Testing/Procedures: None ordered  Follow-Up: Your physician wants you to follow-up in: 9 months with Dr. Tamala Julian. You will receive a reminder letter in the mail two months in advance. If you don't receive a letter, please call our office to schedule the follow-up appointment.   Any Other Special Instructions Will Be Listed Below (If Applicable).  PLEASE GET A COPY OF YOUR LABS AND  ECHOCARDIOGRAM AND HAVE THEM FAXED OVER    If you need a refill on your cardiac medications before your next appointment, please call your pharmacy.      Signed, Sinclair Grooms, MD  03/03/2018 10:41 AM    [place]

## 2018-03-03 ENCOUNTER — Ambulatory Visit (INDEPENDENT_AMBULATORY_CARE_PROVIDER_SITE_OTHER): Payer: Medicare Other | Admitting: Interventional Cardiology

## 2018-03-03 ENCOUNTER — Encounter: Payer: Self-pay | Admitting: Interventional Cardiology

## 2018-03-03 ENCOUNTER — Encounter (INDEPENDENT_AMBULATORY_CARE_PROVIDER_SITE_OTHER): Payer: Self-pay

## 2018-03-03 VITALS — BP 132/76 | HR 65 | Ht 70.0 in | Wt 185.4 lb

## 2018-03-03 DIAGNOSIS — I5032 Chronic diastolic (congestive) heart failure: Secondary | ICD-10-CM | POA: Diagnosis not present

## 2018-03-03 DIAGNOSIS — Z95810 Presence of automatic (implantable) cardiac defibrillator: Secondary | ICD-10-CM

## 2018-03-03 DIAGNOSIS — I10 Essential (primary) hypertension: Secondary | ICD-10-CM | POA: Diagnosis not present

## 2018-03-03 DIAGNOSIS — E7849 Other hyperlipidemia: Secondary | ICD-10-CM | POA: Diagnosis not present

## 2018-03-03 DIAGNOSIS — E1159 Type 2 diabetes mellitus with other circulatory complications: Secondary | ICD-10-CM | POA: Diagnosis not present

## 2018-03-03 DIAGNOSIS — I25709 Atherosclerosis of coronary artery bypass graft(s), unspecified, with unspecified angina pectoris: Secondary | ICD-10-CM | POA: Diagnosis not present

## 2018-03-03 NOTE — Patient Instructions (Signed)
Medication Instructions:  Your physician recommends that you continue on your current medications as directed. Please refer to the Current Medication list given to you today.   Labwork: None ordered  Testing/Procedures: None ordered  Follow-Up: Your physician wants you to follow-up in: 9 months with Dr. Tamala Julian. You will receive a reminder letter in the mail two months in advance. If you don't receive a letter, please call our office to schedule the follow-up appointment.   Any Other Special Instructions Will Be Listed Below (If Applicable).  PLEASE GET A COPY OF YOUR LABS AND ECHOCARDIOGRAM AND HAVE THEM FAXED OVER    If you need a refill on your cardiac medications before your next appointment, please call your pharmacy.

## 2018-03-22 DIAGNOSIS — I255 Ischemic cardiomyopathy: Secondary | ICD-10-CM | POA: Diagnosis not present

## 2018-04-09 ENCOUNTER — Telehealth: Payer: Self-pay | Admitting: Interventional Cardiology

## 2018-04-09 DIAGNOSIS — Z23 Encounter for immunization: Secondary | ICD-10-CM | POA: Diagnosis not present

## 2018-04-09 DIAGNOSIS — Z0000 Encounter for general adult medical examination without abnormal findings: Secondary | ICD-10-CM | POA: Diagnosis not present

## 2018-04-09 DIAGNOSIS — E291 Testicular hypofunction: Secondary | ICD-10-CM | POA: Diagnosis not present

## 2018-04-09 NOTE — Telephone Encounter (Signed)
Called and left message for pt to call back.  I need the office name or Cardiologist name from where the echo wad performed.

## 2018-04-09 NOTE — Telephone Encounter (Signed)
Echo test that he had done at [place], [place].

## 2018-04-13 NOTE — Telephone Encounter (Signed)
Follow up   Patient states that per the previous message the name of the cardiologist is Dr. Crawford Givens at [facility] [place], [place] [address]. The Dr.'s Telephone # is [phone].

## 2018-04-13 NOTE — Telephone Encounter (Signed)
Signed consent form and information given to medical records so records can be obtained.

## 2018-06-08 DIAGNOSIS — J069 Acute upper respiratory infection, unspecified: Secondary | ICD-10-CM | POA: Diagnosis not present

## 2018-07-03 DIAGNOSIS — L309 Dermatitis, unspecified: Secondary | ICD-10-CM | POA: Diagnosis not present

## 2018-07-28 DIAGNOSIS — E7800 Pure hypercholesterolemia, unspecified: Secondary | ICD-10-CM | POA: Diagnosis not present

## 2018-07-28 DIAGNOSIS — Z95810 Presence of automatic (implantable) cardiac defibrillator: Secondary | ICD-10-CM | POA: Diagnosis not present

## 2018-07-28 DIAGNOSIS — E1159 Type 2 diabetes mellitus with other circulatory complications: Secondary | ICD-10-CM | POA: Diagnosis not present

## 2018-07-28 DIAGNOSIS — I2510 Atherosclerotic heart disease of native coronary artery without angina pectoris: Secondary | ICD-10-CM | POA: Diagnosis not present

## 2018-07-28 DIAGNOSIS — E559 Vitamin D deficiency, unspecified: Secondary | ICD-10-CM | POA: Diagnosis not present

## 2018-07-28 DIAGNOSIS — I10 Essential (primary) hypertension: Secondary | ICD-10-CM | POA: Diagnosis not present

## 2018-07-28 DIAGNOSIS — I509 Heart failure, unspecified: Secondary | ICD-10-CM | POA: Diagnosis not present

## 2018-07-28 DIAGNOSIS — K219 Gastro-esophageal reflux disease without esophagitis: Secondary | ICD-10-CM | POA: Diagnosis not present

## 2018-07-28 DIAGNOSIS — E039 Hypothyroidism, unspecified: Secondary | ICD-10-CM | POA: Diagnosis not present

## 2018-07-28 DIAGNOSIS — G2581 Restless legs syndrome: Secondary | ICD-10-CM | POA: Diagnosis not present

## 2018-11-25 DIAGNOSIS — H524 Presbyopia: Secondary | ICD-10-CM | POA: Diagnosis not present

## 2018-11-25 DIAGNOSIS — H527 Unspecified disorder of refraction: Secondary | ICD-10-CM | POA: Diagnosis not present

## 2018-11-25 DIAGNOSIS — H25813 Combined forms of age-related cataract, bilateral: Secondary | ICD-10-CM | POA: Diagnosis not present

## 2018-11-25 DIAGNOSIS — E119 Type 2 diabetes mellitus without complications: Secondary | ICD-10-CM | POA: Diagnosis not present

## 2018-12-15 DIAGNOSIS — L570 Actinic keratosis: Secondary | ICD-10-CM | POA: Diagnosis not present

## 2018-12-15 DIAGNOSIS — L814 Other melanin hyperpigmentation: Secondary | ICD-10-CM | POA: Diagnosis not present

## 2019-02-02 DIAGNOSIS — E1159 Type 2 diabetes mellitus with other circulatory complications: Secondary | ICD-10-CM | POA: Diagnosis not present

## 2019-02-02 DIAGNOSIS — I509 Heart failure, unspecified: Secondary | ICD-10-CM | POA: Diagnosis not present

## 2019-02-02 DIAGNOSIS — E559 Vitamin D deficiency, unspecified: Secondary | ICD-10-CM | POA: Diagnosis not present

## 2019-02-02 DIAGNOSIS — Z23 Encounter for immunization: Secondary | ICD-10-CM | POA: Diagnosis not present

## 2019-02-02 DIAGNOSIS — K219 Gastro-esophageal reflux disease without esophagitis: Secondary | ICD-10-CM | POA: Diagnosis not present

## 2019-02-02 DIAGNOSIS — E7800 Pure hypercholesterolemia, unspecified: Secondary | ICD-10-CM | POA: Diagnosis not present

## 2019-02-02 DIAGNOSIS — I2510 Atherosclerotic heart disease of native coronary artery without angina pectoris: Secondary | ICD-10-CM | POA: Diagnosis not present

## 2019-02-02 DIAGNOSIS — E039 Hypothyroidism, unspecified: Secondary | ICD-10-CM | POA: Diagnosis not present

## 2019-02-02 DIAGNOSIS — G2581 Restless legs syndrome: Secondary | ICD-10-CM | POA: Diagnosis not present

## 2019-02-02 DIAGNOSIS — Z95810 Presence of automatic (implantable) cardiac defibrillator: Secondary | ICD-10-CM | POA: Diagnosis not present

## 2019-02-02 DIAGNOSIS — I10 Essential (primary) hypertension: Secondary | ICD-10-CM | POA: Diagnosis not present

## 2019-02-14 NOTE — Progress Notes (Signed)
Cardiology Office Note Date:  02/16/2019  Patient ID:  Todd, [name] 05/04/1946, MRN [id number] PCP:  Orpah Melter, MD  Cardiologist:  Dr. Tamala Julian Electrophysiologist: Dr. Caryl Comes    Chief Complaint: annual EP visit  History of Present Illness: Todd [name] is a 73 y.o. male with history of CAD (CABG 2007), DM, HLD, HTN, NICM (with improved LVEF), chronic CHF (diastolic), CRT-D.  He comes in today to be seen for Dr. Caryl Comes, last seen by him 01/2018, doing well, no changes were made.  His note makes reference to an echo with normalized LVEF, though I don't find an echo in the system.  More recently saw Dr. Tamala Julian as a new patient.  Hew feeling well, just moved back locally from [facility].  He comes today feeling well, felt like it had been a while for a visit so called to come in.  He sees Dr. Tamala Julian after my visit with him. He denies any CP, palpitations or SOB, no DOE near syncope or syncope.  He reports no exertional intolerances at all. Has a [employer] that he has been working at mentioning heavy physical work and doing well.  Device information: SJM CRT-D implanted 2010, primary prevention, gen change 05/05/16   Past Medical History:  Diagnosis Date  . Coronary artery disease    Status post CABG  . Diabetes mellitus   . Hyperlipidemia   . ICD-St.Jude-CRT    Implanted 2010  . Other primary cardiomyopathies   . Syncope, vasovagal     Past Surgical History:  Procedure Laterality Date  . CORONARY STENT PLACEMENT      Current Outpatient Medications  Medication Sig Dispense Refill  . Alogliptin Benzoate 25 MG TABS Take 1 tablet by mouth daily.    Marland Kitchen amLODipine (NORVASC) 5 MG tablet Take 5 mg by mouth daily.    Marland Kitchen atorvastatin (LIPITOR) 40 MG tablet Take 40 mg by mouth daily.    . baclofen (LIORESAL) 10 MG tablet Take 10 mg by mouth daily.     . carvedilol (COREG) 25 MG tablet Take 25 mg by mouth 2 (two) times daily with a meal.    . cyanocobalamin 2000 MCG tablet Take 2,000  mcg by mouth daily.    . empagliflozin (JARDIANCE) 25 MG TABS tablet Take 25 mg by mouth daily.    . Esomeprazole Magnesium 20 MG TBEC Take 20 mg by mouth daily before breakfast.     . FOLIC ACID PO Take 1 tablet by mouth daily.    Marland Kitchen levothyroxine (SYNTHROID, LEVOTHROID) 50 MCG tablet Take 50 mcg by mouth daily.    Marland Kitchen losartan (COZAAR) 100 MG tablet Take 100 mg by mouth daily.    . metFORMIN (GLUCOPHAGE) 1000 MG tablet Take 1,000 mg by mouth 2 (two) times daily with a meal.    . Multiple Vitamin (MULTIVITAMIN) capsule Take 1 capsule by mouth daily.    . pramipexole (MIRAPEX) 0.25 MG tablet Take 0.25 mg by mouth daily.    Vanessa Kick Ethyl (VASCEPA) 1 g CAPS Take 2 capsules (2 g total) by mouth 2 (two) times daily. 120 capsule 11   No current facility-administered medications for this visit.     Allergies:   Shellfish allergy   Social History:  The patient  reports that he quit smoking about 51 years ago. He has never used smokeless tobacco. He reports previous alcohol use. He reports that he does not use drugs.   Family History:  The patient's family history includes Diabetes  in his brother, brother, and sister; Heart disease in his brother and brother; Hyperlipidemia in his brother, brother, brother, and sister; Hypertension in his brother, brother, and brother.  ROS:  Please see the history of present illness.  All other systems are reviewed and otherwise negative.   PHYSICAL EXAM:  VS:  BP 140/76   Pulse 70   Ht 5' 10.5" (1.791 m)   Wt 189 lb (85.7 kg)   BMI 26.74 kg/m  BMI: Body mass index is 26.74 kg/m. Well nourished, well developed, in no acute distress  HEENT: normocephalic, atraumatic  Neck: no JVD, carotid bruits or masses Cardiac:  RRR; no significant murmurs, no rubs, or gallops Lungs:  CTA b/l, no wheezing, rhonchi or rales  Abd: soft, nontender MS: no deformity or atrophy Ext: no edema  Skin: warm and dry, no rash Neuro:  No gross deficits appreciated Psych:  euthymic mood, full affect  ICD site is stable, no tethering or discomfort   EKG:  SR/V paced ICD interrogation done today and reviewed by myself:  Battery, LV and RV lead measurements are good. RA lead has some noise on it, impedance, sensing and thresholds are stable from last year >99%BiVe pacing 3816 AMS all available EGMs are reviewed, all are noise, all ore very brief. A lead sensitivity was changed from auto to fixed 0.62mV His noise was reproducible with rubbing his hands together, not with pocket manipulation or isometrics. After sensitivity was adjusted the devise saw the noise, though not as much and did not trigger AMS      Recent Labs: No results found for requested labs within last 8760 hours.  No results found for requested labs within last 8760 hours.   CrCl cannot be calculated (No successful lab value found.).   Wt Readings from Last 3 Encounters:  02/16/19 189 lb (85.7 kg)  02/16/19 189 lb (85.7 kg)  03/03/18 185 lb 6.4 oz (84.1 kg)     Other studies reviewed: Additional studies/records reviewed today include: summarized above  ASSESSMENT AND PLAN:  1. CRT-D     We have not gotten remotes, in [facility] with [facility] we requested his cardioogist in [place] release his Merlin to [place] but they have not yet The patient will call and and ask them again to do this   A lead noise.  This is new from his interrogation in clinic last year, likely small lead fracture Stable lead testing/measurements today Discussed with the patient Will monitor this for now Still has about 4 years on his battery  I am sending my note to Dr. Caryl Comes as well as the interrogation   2. 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 Chronic CHF (diastolic)     No symptoms or exam findings of fluid OL     C/w Dr. Tamala Julian  3. HTN     No changes, defer to Dr. Tamala Julian he sees him next today  4. CAD     No symptoms, deferred to Dr. Tamala Julian     Disposition: F/u with  in-clinic in 1 month, this to revisit/ensure his remote management is set up/transferred from [facility] and check his A lead.    Current medicines are reviewed at length with the patient today.  The patient did not have any concerns regarding medicines.  Venetia Night, PA-C 02/16/2019 1:06 PM     [facility] [address] [place] [address] [phone] (office)  [phone] (fax)

## 2019-02-15 ENCOUNTER — Encounter: Payer: Medicare Other | Admitting: Physician Assistant

## 2019-02-15 NOTE — Progress Notes (Signed)
Cardiology Office Note:    Date:  02/16/2019   ID:  Todd [name], DOB 10/06/1945, MRN [id number]  PCP:  Todd Melter, MD  Cardiologist:  No primary care provider on file.   Referring MD: Todd Melter, MD   Chief Complaint  Patient presents with  . Congestive Heart Failure    History of Present Illness:    Todd [name] is a 73 y.o. 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  Todd [name] is doing well.  He denies shortness of breath.  He has been very active.  He has had no limitations.  He has not had angina.  No medication side effects.  Triglyceride is 188 on most recent laboratory data from February 2020.  Hemoglobin A1c was 6.5 in August.  Creatinine is normal at 1.08.  Past Medical History:  Diagnosis Date  . Coronary artery disease    Status post CABG  . Diabetes mellitus   . Hyperlipidemia   . ICD-St.Jude-CRT    Implanted 2010  . Other primary cardiomyopathies   . Syncope, vasovagal     Past Surgical History:  Procedure Laterality Date  . CORONARY STENT PLACEMENT      Current Medications: Current Meds  Medication Sig  . Alogliptin Benzoate 25 MG TABS Take 1 tablet by mouth daily.  Marland Kitchen amLODipine (NORVASC) 5 MG tablet Take 5 mg by mouth daily.  Marland Kitchen atorvastatin (LIPITOR) 40 MG tablet Take 40 mg by mouth daily.  . baclofen (LIORESAL) 10 MG tablet Take 10 mg by mouth daily.   . carvedilol (COREG) 25 MG tablet Take 25 mg by mouth 2 (two) times daily with a meal.  . cyanocobalamin 2000 MCG tablet Take 2,000 mcg by mouth daily.  . empagliflozin (JARDIANCE) 25 MG TABS tablet Take 25 mg by mouth daily.  . Esomeprazole Magnesium 20 MG TBEC Take 20 mg by mouth daily before breakfast.   . FOLIC ACID PO Take 1 tablet by mouth daily.  Marland Kitchen levothyroxine (SYNTHROID, LEVOTHROID) 50 MCG tablet Take 50 mcg  by mouth daily.  Marland Kitchen losartan (COZAAR) 100 MG tablet Take 100 mg by mouth daily.  . metFORMIN (GLUCOPHAGE) 1000 MG tablet Take 1,000 mg by mouth 2 (two) times daily with a meal.  . Multiple Vitamin (MULTIVITAMIN) capsule Take 1 capsule by mouth daily.  . Omega-3 Fatty Acids (FISH OIL PO) Take 1 tablet by mouth daily.  . pramipexole (MIRAPEX) 0.25 MG tablet Take 0.25 mg by mouth daily.     Allergies:   Shellfish allergy   Social History   Socioeconomic History  . Marital status: Married    Spouse name: Not on file  . Number of children: Not on file  . Years of education: Not on file  . Highest education level: Not on file  Occupational History  . Not on file  Social Needs  . Financial resource strain: Not on file  . Food insecurity    Worry: Not on file    Inability: Not on file  . Transportation needs    Medical: Not on file    Non-medical: Not on file  Tobacco Use  . Smoking status: Former Smoker    Quit date: 1969    Years since quitting: 51.7  . Smokeless tobacco: Never Used  Substance and Sexual Activity  . Alcohol use: Not Currently  . Drug use:  Never  . Sexual activity: Not on file  Lifestyle  . Physical activity    Days per week: Not on file    Minutes per session: Not on file  . Stress: Not on file  Relationships  . Social Herbalist on phone: Not on file    Gets together: Not on file    Attends religious service: Not on file    Active member of club or organization: Not on file    Attends meetings of clubs or organizations: Not on file    Relationship status: Not on file  Other Topics Concern  . Not on file  Social History Narrative  . Not on file     Family History: The patient's family history includes Diabetes in his brother, brother, and sister; Heart disease in his brother and brother; Hyperlipidemia in his brother, brother, brother, and sister; Hypertension in his brother, brother, and brother.  ROS:   Please see the history of  present illness.    No complaints.  No musculoskeletal symptoms.  Has lost 30 to 40 pounds all other systems reviewed and are negative.  EKGs/Labs/Other Studies Reviewed:    The following studies were reviewed today: Echocardiogram 2017: Result Narrative   There is mild concentric left ventricular hypertrophy present.  The left ventricular systolic function is normal (55-65%).  Grade I (mild) left ventricular diastolic dysfunction present,  consistent with impaired relaxation. Probably normal LV filling pressure.  Normal RV size and systolic function.  No significant valve abnormalities.     EKG:  EKG atrial tracking, ventricular pacing.  No change when compared to prior.  Recent Labs: No results found for requested labs within last 8760 hours.  Recent Lipid Panel No results found for: CHOL, TRIG, HDL, CHOLHDL, VLDL, LDLCALC, LDLDIRECT  Physical Exam:    VS:  BP (!) 144/64   Pulse 70   Ht 5' 10.5" (1.791 m)   Wt 189 lb (85.7 kg)   SpO2 97%   BMI 26.74 kg/m     Wt Readings from Last 3 Encounters:  02/16/19 189 lb (85.7 kg)  02/16/19 189 lb (85.7 kg)  03/03/18 185 lb 6.4 oz (84.1 kg)     GEN: Appears younger than stated age.  He is mast.. No acute distress HEENT: Normal NECK: No JVD. LYMPHATICS: No lymphadenopathy CARDIAC:  RRR without murmur, gallop, or edema. VASCULAR:  Normal Pulses. No bruits. RESPIRATORY:  Clear to auscultation without rales, wheezing or rhonchi  ABDOMEN: Soft, non-tender, non-distended, No pulsatile mass, MUSCULOSKELETAL: No deformity  SKIN: Warm and dry NEUROLOGIC:  Alert and oriented x 3 PSYCHIATRIC:  Normal affect   ASSESSMENT:    1. Coronary artery disease involving coronary bypass graft of native heart with angina pectoris ([place])   2. Chronic diastolic heart failure ([place])   3. Essential hypertension   4. Type 2 diabetes mellitus with other circulatory complication, without long-term current use of insulin (H[facility])   5. Other  hyperlipidemia   6. Educated About Covid-19 Virus Infection    PLAN:    In order of problems listed above:  1. Secondary prevention discussed in detail.  Triglyceride is 188 and needs attention given his risk factor profile. 2. Last LVEF done in 2017 prior to returning to [facility] revealed an EF of 60 to 65%.  3. He monitors blood pressure closely at home and for the most part is less than [id number] mmHg systolic. 4. Hemoglobin A1c is 6.5. 5. Triglyceride is 188.  At icosapent  Noack acid ethyl 2 g p.o. twice daily.  Lipid panel in 2 months. 6. Social distancing, masking, and handwashing is stressed.  Overall education and awareness concerning primary/secondary risk prevention was discussed in detail: LDL less than 70, hemoglobin A1c less than 7, blood pressure target less than 130/80 mmHg, >150 minutes of moderate aerobic activity per week, avoidance of smoking, weight control (via diet and exercise), and continued surveillance/management of/for obstructive sleep apnea.  We will add VASCEPA to SGLT2 therapy for independent additional risk protection.   Medication Adjustments/Labs and Tests Ordered: Current medicines are reviewed at length with the patient today.  Concerns regarding medicines are outlined above.  No orders of the defined types were placed in this encounter.  No orders of the defined types were placed in this encounter.   There are no Patient Instructions on file for this visit.   Signed, Sinclair Grooms, MD  02/16/2019 11:34 AM    [place]

## 2019-02-16 ENCOUNTER — Telehealth: Payer: Self-pay | Admitting: Internal Medicine

## 2019-02-16 ENCOUNTER — Other Ambulatory Visit: Payer: Self-pay

## 2019-02-16 ENCOUNTER — Encounter: Payer: Self-pay | Admitting: Interventional Cardiology

## 2019-02-16 ENCOUNTER — Ambulatory Visit (INDEPENDENT_AMBULATORY_CARE_PROVIDER_SITE_OTHER): Payer: Medicare Other | Admitting: Interventional Cardiology

## 2019-02-16 ENCOUNTER — Ambulatory Visit (INDEPENDENT_AMBULATORY_CARE_PROVIDER_SITE_OTHER): Payer: Medicare Other | Admitting: Physician Assistant

## 2019-02-16 ENCOUNTER — Encounter: Payer: Self-pay | Admitting: Physician Assistant

## 2019-02-16 VITALS — BP 140/76 | HR 70 | Ht 70.5 in | Wt 189.0 lb

## 2019-02-16 VITALS — BP 144/64 | HR 70 | Ht 70.5 in | Wt 189.0 lb

## 2019-02-16 DIAGNOSIS — I5032 Chronic diastolic (congestive) heart failure: Secondary | ICD-10-CM | POA: Diagnosis not present

## 2019-02-16 DIAGNOSIS — I10 Essential (primary) hypertension: Secondary | ICD-10-CM

## 2019-02-16 DIAGNOSIS — Z7189 Other specified counseling: Secondary | ICD-10-CM

## 2019-02-16 DIAGNOSIS — E1159 Type 2 diabetes mellitus with other circulatory complications: Secondary | ICD-10-CM

## 2019-02-16 DIAGNOSIS — Z95810 Presence of automatic (implantable) cardiac defibrillator: Secondary | ICD-10-CM

## 2019-02-16 DIAGNOSIS — I255 Ischemic cardiomyopathy: Secondary | ICD-10-CM

## 2019-02-16 DIAGNOSIS — I25709 Atherosclerosis of coronary artery bypass graft(s), unspecified, with unspecified angina pectoris: Secondary | ICD-10-CM

## 2019-02-16 DIAGNOSIS — E7849 Other hyperlipidemia: Secondary | ICD-10-CM

## 2019-02-16 MED ORDER — VASCEPA 1 G PO CAPS: 2.0000 | ORAL_CAPSULE | Freq: Two times a day (BID) | ORAL | 11 refills | Status: DC

## 2019-02-16 NOTE — Telephone Encounter (Signed)
New message  Patient is calling in to confirm that his device transmissions were received. Patient states that he has transferred device information to the [address] office. Please give patient a call back to confirm.

## 2019-02-16 NOTE — Telephone Encounter (Signed)
Spoke with patient. Confirmed that I was able to pull his remote monitoring (Merlin) into our clinic. Next transmission scheduled for 05/18/19, monitor is up to date via cell adapter. Pt aware we will call if this transmission is not received automatically. No additional questions or concerns at this time.

## 2019-02-16 NOTE — Patient Instructions (Signed)
Medication Instructions:  1) DISCONTINUE Omega 3 Fatty Acids 2) START Vascepa 2grams twice daily.  You will need to take 2 capsules in the morning and 2 in the evening.   If you need a refill on your cardiac medications before your next appointment, please call your pharmacy.   Lab work: Your physician recommends that you return for lab work in: 2 months (Lipid, Liver). You will need to come fasting for these labs (nothing to eat or drink after midnight except water and black coffee).   If you have labs (blood work) drawn today and your tests are completely normal, you will receive your results only by: Marland Kitchen MyChart Message (if you have MyChart) OR . A paper copy in the mail If you have any lab test that is abnormal or we need to change your treatment, we will call you to review the results.  Testing/Procedures: None  Follow-Up: At [facility], you and your health needs are our priority.  As part of our continuing mission to provide you with exceptional heart care, we have created designated Provider Care Teams.  These Care Teams include your primary Cardiologist (physician) and Advanced Practice Providers (APPs -  Physician Assistants and Nurse Practitioners) who all work together to provide you with the care you need, when you need it. You will need a follow up appointment in 12 months.  Please call our office 2 months in advance to schedule this appointment.  You may see Dr. Tamala Julian or one of the following Advanced Practice Providers on your designated Care Team:   Truitt Merle, NP Cecilie Kicks, NP . Kathyrn Drown, NP  Any Other Special Instructions Will Be Listed Below (If Applicable).

## 2019-02-16 NOTE — Patient Instructions (Signed)
Medication Instructions:  Your physician recommends that you continue on your current medications as directed. Please refer to the Current Medication list given to you today.  If you need a refill on your cardiac medications before your next appointment, please call your pharmacy.   Lab work: NONE ORDERED  TODAY   If you have labs (blood work) drawn today and your tests are completely normal, you will receive your results only by: Marland Kitchen MyChart Message (if you have MyChart) OR . A paper copy in the mail If you have any lab test that is abnormal or we need to change your treatment, we will call you to review the results.  Testing/Procedures: NONE ORDERED  TODAY  Follow-Up:  ONE MONTH WITH DEVICE CLINIC   Any Other Special Instructions Will Be Listed Below (If Applicable).

## 2019-03-17 NOTE — Progress Notes (Signed)
Cardiology Office Note Date:  03/17/2019  Patient ID:  Todd [name], Todd [name] 1945-10-18, MRN [id number] PCP:  Orpah Melter, MD  Cardiologist:  Dr. Tamala Julian Electrophysiologist: Dr. Caryl Comes    Chief Complaint: f/u on A lead/remotes  History of Present Illness: Todd [name] is a 73 y.o. male with history of CAD (CABG 2007), DM, HLD, HTN, NICM (with improved LVEF), chronic CHF (diastolic), CRT-D.  He comes in today to be seen for Dr. Caryl Comes, last seen by him 01/2018, doing well, no changes were made.  His note makes reference to an echo with normalized LVEF, though I don't find an echo in the system.  More recently saw Dr. Tamala Julian as a new patient.  Hew feeling well, just moved back locally from [facility]  I saw him last month to re-establish with EP, he was feeling well, just felt like it had been a while for a visit so called to come in.  He denied any CP, palpitations or SOB, no DOE near syncope or syncope.  He reported no exertional intolerances at all. Has a [place] that he has been working at mentioning heavy physical work and doing well. AT this visit noted reproducible noise on his RA lead with rubbing his hands together, not with pocket manipulation or isometrics.  The sensitivity was adjusted the devise saw the noise, though not as much and did not trigger AMS. Despite having been requested his cardiologist office in [facility] had not released his [place] to [place], we re-requested this be done and the patient staed he had given them permission/asked as well, but would call again.  Review in [place] looks like we have been able to transfer his monitoring.  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  No syncope, near syncope, no palpitations.  Device information: SJM CRT-D implanted 2010, primary prevention, gen change 05/05/16   Past Medical History:  Diagnosis Date  . Coronary artery  disease    Status post CABG  . Diabetes mellitus   . Hyperlipidemia   . ICD-St.Jude-CRT    Implanted 2010  . Other primary cardiomyopathies   . Syncope, vasovagal     Past Surgical History:  Procedure Laterality Date  . CORONARY STENT PLACEMENT      Current Outpatient Medications  Medication Sig Dispense Refill  . Alogliptin Benzoate 25 MG TABS Take 1 tablet by mouth daily.    Marland Kitchen amLODipine (NORVASC) 5 MG tablet Take 5 mg by mouth daily.    Marland Kitchen atorvastatin (LIPITOR) 40 MG tablet Take 40 mg by mouth daily.    . baclofen (LIORESAL) 10 MG tablet Take 10 mg by mouth daily.     . carvedilol (COREG) 25 MG tablet Take 25 mg by mouth 2 (two) times daily with a meal.    . cyanocobalamin 2000 MCG tablet Take 2,000 mcg by mouth daily.    . empagliflozin (JARDIANCE) 25 MG TABS tablet Take 25 mg by mouth daily.    . Esomeprazole Magnesium 20 MG TBEC Take 20 mg by mouth daily before breakfast.     . FOLIC ACID PO Take 1 tablet by mouth daily.    Vanessa Kick Ethyl (VASCEPA) 1 g CAPS Take 2 capsules (2 g total) by mouth 2 (two) times daily. 120 capsule 11  . levothyroxine (SYNTHROID, LEVOTHROID) 50 MCG tablet Take 50 mcg by mouth daily.    Marland Kitchen losartan (COZAAR) 100 MG tablet  Take 100 mg by mouth daily.    . metFORMIN (GLUCOPHAGE) 1000 MG tablet Take 1,000 mg by mouth 2 (two) times daily with a meal.    . Multiple Vitamin (MULTIVITAMIN) capsule Take 1 capsule by mouth daily.    . pramipexole (MIRAPEX) 0.25 MG tablet Take 0.25 mg by mouth daily.     No current facility-administered medications for this visit.     Allergies:   Shellfish allergy   Social History:  The patient  reports that he quit smoking about 51 years ago. He has never used smokeless tobacco. He reports previous alcohol use. He reports that he does not use drugs.   Family History:  The patient's family history includes Diabetes in his brother, brother, and sister; Heart disease in his brother and brother; Hyperlipidemia in his  brother, brother, brother, and sister; Hypertension in his brother, brother, and brother.  ROS:  Please see the history of present illness.  All other systems are reviewed and otherwise negative.   PHYSICAL EXAM:  VS:  There were no vitals taken for this visit. BMI: There is no height or weight on file to calculate BMI. Well nourished, well developed, in no acute distress  HEENT: normocephalic, atraumatic  Neck: no JVD, carotid bruits or masses Cardiac:  RRR; no significant murmurs, no rubs, or gallops Lungs:  CTA b/l, no wheezing, rhonchi or rales  Abd: soft, nontender MS: no deformity or atrophy Ext: no edema  Skin: warm and dry, no rash Neuro:  No gross deficits appreciated Psych: euthymic mood, full affect  ICD site is stable, no tethering or discomfort   EKG:  SR/V paced ICD interrogation done today and reviewed by myself:  Battery and lead measurements are stable LV lead threshold up slightly 2.0/1.0 (d/w Dr. Caryl Comes, no change to output recommended) RA lead sensing and impedance stable 179 AMS, all available EGMs reviewed, all are noise, all are brief, longest 85min44sec 6.4% AP >99%VP No V arrhythmias   Last month noted: Battery, LV and RV lead measurements are good. RA lead has some noise on it, impedance, sensing and thresholds are stable from last year >99%BiVe pacing 3816 AMS all available EGMs are reviewed, all are noise, all are very brief. A lead sensitivity was changed from auto to fixed 0.11mV His noise was reproducible with rubbing his hands together, not with pocket manipulation or isometrics. After sensitivity was adjusted the devise saw the noise, though not as much and did not trigger AMS   Echocardiogram 2017: Result Narrative   There is mild concentric left ventricular hypertrophy present.  The left ventricular systolic function is normal (55-65%).  Grade I (mild) left ventricular diastolic dysfunction present,  consistent with impaired  relaxation. Probably normal LV filling pressure.  Normal RV size and systolic function.  No significant valve abnormalities.      Recent Labs: No results found for requested labs within last 8760 hours.  No results found for requested labs within last 8760 hours.   CrCl cannot be calculated (No successful lab value found.).   Wt Readings from Last 3 Encounters:  02/16/19 189 lb (85.7 kg)  02/16/19 189 lb (85.7 kg)  03/03/18 185 lb 6.4 oz (84.1 kg)     Other studies reviewed: Additional studies/records reviewed today include: summarized above  ASSESSMENT AND PLAN:  1. CRT-D     A lead noise.  This is new from his interrogation in clinic last year, likely small lead fracture Stable lead testing/measurements today Dr.Klein reviewed interrogation, sensitivity  was changed to 1.0 (least sensitive possible) This reduced but did not eliminate the lead seeing noise today (reproducible) Dr. Caryl Comes discussed with the patient findings, plan will be to monitor plan new lead at gen change if we can avoid it until then    2. 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 Chronic CHF (diastolic)     No symptoms or exam findings of fluid OL     C/w Dr. Tamala Julian  3. HTN     Looks good  4. CAD     No symptoms     C/w Dr. Tamala Julian     Disposition: Q 3 month remotes, in clinic 1 year, sooner if needed  Current medicines are reviewed at length with the patient today.  The patient did not have any concerns regarding medicines.  Venetia Night, PA-C 03/17/2019 11:40 AM     [facility] [address] [place] [address] [phone] (office)  [phone] (fax)

## 2019-03-18 ENCOUNTER — Encounter (INDEPENDENT_AMBULATORY_CARE_PROVIDER_SITE_OTHER): Payer: Self-pay

## 2019-03-18 ENCOUNTER — Other Ambulatory Visit: Payer: Self-pay

## 2019-03-18 ENCOUNTER — Ambulatory Visit (INDEPENDENT_AMBULATORY_CARE_PROVIDER_SITE_OTHER): Payer: Medicare Other | Admitting: Physician Assistant

## 2019-03-18 VITALS — BP 118/68 | HR 72 | Ht 70.5 in | Wt 187.0 lb

## 2019-03-18 DIAGNOSIS — I255 Ischemic cardiomyopathy: Secondary | ICD-10-CM | POA: Diagnosis not present

## 2019-03-18 DIAGNOSIS — I428 Other cardiomyopathies: Secondary | ICD-10-CM | POA: Diagnosis not present

## 2019-03-18 DIAGNOSIS — Z95810 Presence of automatic (implantable) cardiac defibrillator: Secondary | ICD-10-CM

## 2019-03-18 DIAGNOSIS — R55 Syncope and collapse: Secondary | ICD-10-CM | POA: Diagnosis not present

## 2019-03-18 NOTE — Patient Instructions (Signed)
Medication Instructions:  Your physician recommends that you continue on your current medications as directed. Please refer to the Current Medication list given to you today.  If you need a refill on your cardiac medications before your next appointment, please call your pharmacy.   Lab work:NONE ORDERED  TODAY]  If you have labs (blood work) drawn today and your tests are completely normal, you will receive your results only by: . MyChart Message (if you have MyChart) OR . A paper copy in the mail If you have any lab test that is abnormal or we need to change your treatment, we will call you to review the results.  Testing/Procedures: NONE ORDERED  TODAY   Follow-Up: At [facility], you and your health needs are our priority.  As part of our continuing mission to provide you with exceptional heart care, we have created designated Provider Care Teams.  These Care Teams include your primary Cardiologist (physician) and Advanced Practice Providers (APPs -  Physician Assistants and Nurse Practitioners) who all work together to provide you with the care you need, when you need it. You will need a follow up appointment in 1 years.  Please call our office 2 months in advance to schedule this appointment.  You may see Dr. Klein  or one of the following Advanced Practice Providers on your designated Care Team:   Amber Seiler, NP . Renee Ursuy, PA-C  Any Other Special Instructions Will Be Listed Below (If Applicable).    

## 2019-04-11 DIAGNOSIS — M79641 Pain in right hand: Secondary | ICD-10-CM | POA: Diagnosis not present

## 2019-04-11 DIAGNOSIS — Z0000 Encounter for general adult medical examination without abnormal findings: Secondary | ICD-10-CM | POA: Diagnosis not present

## 2019-04-11 DIAGNOSIS — Z23 Encounter for immunization: Secondary | ICD-10-CM | POA: Diagnosis not present

## 2019-04-18 ENCOUNTER — Other Ambulatory Visit: Payer: Self-pay

## 2019-04-18 ENCOUNTER — Other Ambulatory Visit: Payer: Medicare Other | Admitting: *Deleted

## 2019-04-18 DIAGNOSIS — E7849 Other hyperlipidemia: Secondary | ICD-10-CM

## 2019-04-18 DIAGNOSIS — I10 Essential (primary) hypertension: Secondary | ICD-10-CM

## 2019-04-18 LAB — LIPID PANEL
Chol/HDL Ratio: 3.8 ratio (ref 0.0–5.0)
Cholesterol, Total: 132 mg/dL (ref 100–199)
HDL: 35 mg/dL — ABNORMAL LOW (ref >39)
LDL Chol Calc (NIH): 58 mg/dL (ref 0–99)
Triglycerides: 244 mg/dL — ABNORMAL HIGH (ref 0–149)
VLDL Cholesterol Cal: 39 mg/dL (ref 5–40)

## 2019-04-18 LAB — HEPATIC FUNCTION PANEL
ALT: 18 IU/L (ref 0–44)
AST: 14 IU/L (ref 0–40)
Albumin: 4.7 g/dL (ref 3.7–4.7)
Alkaline Phosphatase: 50 IU/L (ref 39–117)
Bilirubin Total: 0.4 mg/dL (ref 0.0–1.2)
Bilirubin, Direct: 0.12 mg/dL (ref 0.00–0.40)
Total Protein: 6.7 g/dL (ref 6.0–8.5)

## 2019-04-21 ENCOUNTER — Telehealth: Payer: Self-pay | Admitting: Interventional Cardiology

## 2019-04-21 NOTE — Telephone Encounter (Signed)
Patient is calling with a question regarding his blood work results.

## 2019-04-21 NOTE — Telephone Encounter (Signed)
Pt called back and has been notified of lab and numbers on his lipid panel. Pt thanked me for calling him back and giving him the numbers.

## 2019-05-18 ENCOUNTER — Ambulatory Visit (INDEPENDENT_AMBULATORY_CARE_PROVIDER_SITE_OTHER): Payer: Medicare Other | Admitting: *Deleted

## 2019-05-18 DIAGNOSIS — Z95810 Presence of automatic (implantable) cardiac defibrillator: Secondary | ICD-10-CM | POA: Diagnosis not present

## 2019-05-18 LAB — CUP PACEART REMOTE DEVICE CHECK
Date Time Interrogation Session: 20201209070406
Implantable Lead Implant Date: 20101005
Implantable Lead Implant Date: 20101005
Implantable Lead Implant Date: 20101005
Implantable Lead Location: 753858
Implantable Lead Location: 753859
Implantable Lead Location: 753860
Implantable Lead Model: 1944
Implantable Pulse Generator Implant Date: 20171127
Pulse Gen Serial Number: 7371595

## 2019-06-25 NOTE — Progress Notes (Signed)
ICD remote 

## 2019-07-07 DIAGNOSIS — L509 Urticaria, unspecified: Secondary | ICD-10-CM | POA: Diagnosis not present

## 2019-07-07 DIAGNOSIS — T7840XA Allergy, unspecified, initial encounter: Secondary | ICD-10-CM | POA: Diagnosis not present

## 2019-07-07 DIAGNOSIS — T781XXA Other adverse food reactions, not elsewhere classified, initial encounter: Secondary | ICD-10-CM | POA: Diagnosis not present

## 2019-08-04 DIAGNOSIS — E7800 Pure hypercholesterolemia, unspecified: Secondary | ICD-10-CM | POA: Diagnosis not present

## 2019-08-04 DIAGNOSIS — E1159 Type 2 diabetes mellitus with other circulatory complications: Secondary | ICD-10-CM | POA: Diagnosis not present

## 2019-08-04 DIAGNOSIS — I2510 Atherosclerotic heart disease of native coronary artery without angina pectoris: Secondary | ICD-10-CM | POA: Diagnosis not present

## 2019-08-04 DIAGNOSIS — I10 Essential (primary) hypertension: Secondary | ICD-10-CM | POA: Diagnosis not present

## 2019-08-04 DIAGNOSIS — E039 Hypothyroidism, unspecified: Secondary | ICD-10-CM | POA: Diagnosis not present

## 2019-08-17 ENCOUNTER — Ambulatory Visit (INDEPENDENT_AMBULATORY_CARE_PROVIDER_SITE_OTHER): Payer: Medicare Other | Admitting: *Deleted

## 2019-08-17 DIAGNOSIS — Z95810 Presence of automatic (implantable) cardiac defibrillator: Secondary | ICD-10-CM | POA: Diagnosis not present

## 2019-08-17 LAB — CUP PACEART REMOTE DEVICE CHECK
Battery Remaining Longevity: 44 mo
Battery Remaining Percentage: 54 %
Battery Voltage: 2.95 V
Brady Statistic AP VP Percent: 5.5 %
Brady Statistic AP VS Percent: 1 %
Brady Statistic AS VP Percent: 94 %
Brady Statistic AS VS Percent: 1 %
Brady Statistic RA Percent Paced: 5.3 %
Date Time Interrogation Session: 20210310040016
HighPow Impedance: 47 Ohm
HighPow Impedance: 47 Ohm
Implantable Lead Implant Date: 20101005
Implantable Lead Implant Date: 20101005
Implantable Lead Implant Date: 20101005
Implantable Lead Location: 753858
Implantable Lead Location: 753859
Implantable Lead Location: 753860
Implantable Lead Model: 1944
Implantable Pulse Generator Implant Date: 20171127
Lead Channel Impedance Value: 410 Ohm
Lead Channel Impedance Value: 460 Ohm
Lead Channel Impedance Value: 840 Ohm
Lead Channel Pacing Threshold Amplitude: 0.375 V
Lead Channel Pacing Threshold Amplitude: 0.75 V
Lead Channel Pacing Threshold Amplitude: 2 V
Lead Channel Pacing Threshold Pulse Width: 0.5 ms
Lead Channel Pacing Threshold Pulse Width: 0.5 ms
Lead Channel Pacing Threshold Pulse Width: 1 ms
Lead Channel Sensing Intrinsic Amplitude: 1.7 mV
Lead Channel Sensing Intrinsic Amplitude: 11.9 mV
Lead Channel Setting Pacing Amplitude: 2 V
Lead Channel Setting Pacing Amplitude: 2 V
Lead Channel Setting Pacing Amplitude: 2.5 V
Lead Channel Setting Pacing Pulse Width: 0.5 ms
Lead Channel Setting Pacing Pulse Width: 1 ms
Lead Channel Setting Sensing Sensitivity: 0.5 mV
Pulse Gen Serial Number: 7371595

## 2019-08-18 NOTE — Progress Notes (Signed)
ICD Remote  

## 2019-10-24 DIAGNOSIS — H00023 Hordeolum internum right eye, unspecified eyelid: Secondary | ICD-10-CM | POA: Diagnosis not present

## 2019-10-24 DIAGNOSIS — H0100B Unspecified blepharitis left eye, upper and lower eyelids: Secondary | ICD-10-CM | POA: Diagnosis not present

## 2019-10-24 DIAGNOSIS — H02052 Trichiasis without entropian right lower eyelid: Secondary | ICD-10-CM | POA: Diagnosis not present

## 2019-10-24 DIAGNOSIS — H0100A Unspecified blepharitis right eye, upper and lower eyelids: Secondary | ICD-10-CM | POA: Diagnosis not present

## 2019-11-04 DIAGNOSIS — R0683 Snoring: Secondary | ICD-10-CM | POA: Diagnosis not present

## 2019-11-04 DIAGNOSIS — G4710 Hypersomnia, unspecified: Secondary | ICD-10-CM | POA: Diagnosis not present

## 2019-11-15 DIAGNOSIS — I2510 Atherosclerotic heart disease of native coronary artery without angina pectoris: Secondary | ICD-10-CM | POA: Diagnosis not present

## 2019-11-15 DIAGNOSIS — I10 Essential (primary) hypertension: Secondary | ICD-10-CM | POA: Diagnosis not present

## 2019-11-15 DIAGNOSIS — I509 Heart failure, unspecified: Secondary | ICD-10-CM | POA: Diagnosis not present

## 2019-11-15 DIAGNOSIS — E7800 Pure hypercholesterolemia, unspecified: Secondary | ICD-10-CM | POA: Diagnosis not present

## 2019-11-15 DIAGNOSIS — E039 Hypothyroidism, unspecified: Secondary | ICD-10-CM | POA: Diagnosis not present

## 2019-11-15 DIAGNOSIS — E1159 Type 2 diabetes mellitus with other circulatory complications: Secondary | ICD-10-CM | POA: Diagnosis not present

## 2019-11-16 ENCOUNTER — Ambulatory Visit (INDEPENDENT_AMBULATORY_CARE_PROVIDER_SITE_OTHER): Payer: Medicare Other | Admitting: *Deleted

## 2019-11-16 DIAGNOSIS — I428 Other cardiomyopathies: Secondary | ICD-10-CM | POA: Diagnosis not present

## 2019-11-16 LAB — CUP PACEART REMOTE DEVICE CHECK
Battery Remaining Longevity: 41 mo
Battery Remaining Percentage: 51 %
Battery Voltage: 2.93 V
Brady Statistic AP VP Percent: 5.6 %
Brady Statistic AP VS Percent: 1 %
Brady Statistic AS VP Percent: 94 %
Brady Statistic AS VS Percent: 1 %
Brady Statistic RA Percent Paced: 5.4 %
Date Time Interrogation Session: 20210609040017
HighPow Impedance: 47 Ohm
HighPow Impedance: 48 Ohm
Implantable Lead Implant Date: 20101005
Implantable Lead Implant Date: 20101005
Implantable Lead Implant Date: 20101005
Implantable Lead Location: 753858
Implantable Lead Location: 753859
Implantable Lead Location: 753860
Implantable Lead Model: 1944
Implantable Pulse Generator Implant Date: 20171127
Lead Channel Impedance Value: 410 Ohm
Lead Channel Impedance Value: 440 Ohm
Lead Channel Impedance Value: 810 Ohm
Lead Channel Pacing Threshold Amplitude: 0.375 V
Lead Channel Pacing Threshold Amplitude: 0.625 V
Lead Channel Pacing Threshold Amplitude: 2 V
Lead Channel Pacing Threshold Pulse Width: 0.5 ms
Lead Channel Pacing Threshold Pulse Width: 0.5 ms
Lead Channel Pacing Threshold Pulse Width: 1 ms
Lead Channel Sensing Intrinsic Amplitude: 11.9 mV
Lead Channel Sensing Intrinsic Amplitude: 2.2 mV
Lead Channel Setting Pacing Amplitude: 2 V
Lead Channel Setting Pacing Amplitude: 2 V
Lead Channel Setting Pacing Amplitude: 2.5 V
Lead Channel Setting Pacing Pulse Width: 0.5 ms
Lead Channel Setting Pacing Pulse Width: 1 ms
Lead Channel Setting Sensing Sensitivity: 0.5 mV
Pulse Gen Serial Number: 7371595

## 2019-11-17 NOTE — Progress Notes (Signed)
Remote ICD transmission.   

## 2019-12-18 DIAGNOSIS — G4733 Obstructive sleep apnea (adult) (pediatric): Secondary | ICD-10-CM | POA: Diagnosis not present

## 2019-12-19 DIAGNOSIS — L570 Actinic keratosis: Secondary | ICD-10-CM | POA: Diagnosis not present

## 2019-12-19 DIAGNOSIS — L821 Other seborrheic keratosis: Secondary | ICD-10-CM | POA: Diagnosis not present

## 2019-12-19 DIAGNOSIS — L578 Other skin changes due to chronic exposure to nonionizing radiation: Secondary | ICD-10-CM | POA: Diagnosis not present

## 2020-01-01 DIAGNOSIS — I2510 Atherosclerotic heart disease of native coronary artery without angina pectoris: Secondary | ICD-10-CM | POA: Diagnosis not present

## 2020-01-01 DIAGNOSIS — E039 Hypothyroidism, unspecified: Secondary | ICD-10-CM | POA: Diagnosis not present

## 2020-01-01 DIAGNOSIS — E1159 Type 2 diabetes mellitus with other circulatory complications: Secondary | ICD-10-CM | POA: Diagnosis not present

## 2020-01-01 DIAGNOSIS — E7800 Pure hypercholesterolemia, unspecified: Secondary | ICD-10-CM | POA: Diagnosis not present

## 2020-01-01 DIAGNOSIS — I509 Heart failure, unspecified: Secondary | ICD-10-CM | POA: Diagnosis not present

## 2020-01-01 DIAGNOSIS — I10 Essential (primary) hypertension: Secondary | ICD-10-CM | POA: Diagnosis not present

## 2020-01-19 DIAGNOSIS — I2510 Atherosclerotic heart disease of native coronary artery without angina pectoris: Secondary | ICD-10-CM | POA: Diagnosis not present

## 2020-01-19 DIAGNOSIS — E1159 Type 2 diabetes mellitus with other circulatory complications: Secondary | ICD-10-CM | POA: Diagnosis not present

## 2020-01-19 DIAGNOSIS — E782 Mixed hyperlipidemia: Secondary | ICD-10-CM | POA: Diagnosis not present

## 2020-01-19 DIAGNOSIS — E039 Hypothyroidism, unspecified: Secondary | ICD-10-CM | POA: Diagnosis not present

## 2020-01-19 DIAGNOSIS — E7800 Pure hypercholesterolemia, unspecified: Secondary | ICD-10-CM | POA: Diagnosis not present

## 2020-01-19 DIAGNOSIS — I509 Heart failure, unspecified: Secondary | ICD-10-CM | POA: Diagnosis not present

## 2020-01-19 DIAGNOSIS — I10 Essential (primary) hypertension: Secondary | ICD-10-CM | POA: Diagnosis not present

## 2020-01-22 ENCOUNTER — Other Ambulatory Visit: Payer: Self-pay | Admitting: Interventional Cardiology

## 2020-01-30 DIAGNOSIS — G4733 Obstructive sleep apnea (adult) (pediatric): Secondary | ICD-10-CM | POA: Diagnosis not present

## 2020-01-30 DIAGNOSIS — G2581 Restless legs syndrome: Secondary | ICD-10-CM | POA: Diagnosis not present

## 2020-01-30 DIAGNOSIS — G4710 Hypersomnia, unspecified: Secondary | ICD-10-CM | POA: Diagnosis not present

## 2020-01-30 DIAGNOSIS — R0683 Snoring: Secondary | ICD-10-CM | POA: Diagnosis not present

## 2020-02-01 DIAGNOSIS — Z95810 Presence of automatic (implantable) cardiac defibrillator: Secondary | ICD-10-CM | POA: Diagnosis not present

## 2020-02-01 DIAGNOSIS — E782 Mixed hyperlipidemia: Secondary | ICD-10-CM | POA: Diagnosis not present

## 2020-02-01 DIAGNOSIS — E1159 Type 2 diabetes mellitus with other circulatory complications: Secondary | ICD-10-CM | POA: Diagnosis not present

## 2020-02-01 DIAGNOSIS — D126 Benign neoplasm of colon, unspecified: Secondary | ICD-10-CM | POA: Diagnosis not present

## 2020-02-01 DIAGNOSIS — I2510 Atherosclerotic heart disease of native coronary artery without angina pectoris: Secondary | ICD-10-CM | POA: Diagnosis not present

## 2020-02-01 DIAGNOSIS — E039 Hypothyroidism, unspecified: Secondary | ICD-10-CM | POA: Diagnosis not present

## 2020-02-01 DIAGNOSIS — I10 Essential (primary) hypertension: Secondary | ICD-10-CM | POA: Diagnosis not present

## 2020-02-15 ENCOUNTER — Ambulatory Visit (INDEPENDENT_AMBULATORY_CARE_PROVIDER_SITE_OTHER): Payer: Medicare Other | Admitting: *Deleted

## 2020-02-15 DIAGNOSIS — I428 Other cardiomyopathies: Secondary | ICD-10-CM | POA: Diagnosis not present

## 2020-02-15 LAB — CUP PACEART REMOTE DEVICE CHECK
Battery Remaining Longevity: 38 mo
Battery Remaining Percentage: 47 %
Battery Voltage: 2.93 V
Brady Statistic AP VP Percent: 6.1 %
Brady Statistic AP VS Percent: 1 %
Brady Statistic AS VP Percent: 94 %
Brady Statistic AS VS Percent: 1 %
Brady Statistic RA Percent Paced: 5.9 %
Date Time Interrogation Session: 20210908040015
HighPow Impedance: 45 Ohm
HighPow Impedance: 46 Ohm
Implantable Lead Implant Date: 20101005
Implantable Lead Implant Date: 20101005
Implantable Lead Implant Date: 20101005
Implantable Lead Location: 753858
Implantable Lead Location: 753859
Implantable Lead Location: 753860
Implantable Lead Model: 1944
Implantable Pulse Generator Implant Date: 20171127
Lead Channel Impedance Value: 430 Ohm
Lead Channel Impedance Value: 460 Ohm
Lead Channel Impedance Value: 810 Ohm
Lead Channel Pacing Threshold Amplitude: 0.25 V
Lead Channel Pacing Threshold Amplitude: 0.625 V
Lead Channel Pacing Threshold Amplitude: 2 V
Lead Channel Pacing Threshold Pulse Width: 0.5 ms
Lead Channel Pacing Threshold Pulse Width: 0.5 ms
Lead Channel Pacing Threshold Pulse Width: 1 ms
Lead Channel Sensing Intrinsic Amplitude: 1.9 mV
Lead Channel Sensing Intrinsic Amplitude: 11.9 mV
Lead Channel Setting Pacing Amplitude: 2 V
Lead Channel Setting Pacing Amplitude: 2 V
Lead Channel Setting Pacing Amplitude: 2.5 V
Lead Channel Setting Pacing Pulse Width: 0.5 ms
Lead Channel Setting Pacing Pulse Width: 1 ms
Lead Channel Setting Sensing Sensitivity: 0.5 mV
Pulse Gen Serial Number: 7371595

## 2020-02-16 NOTE — Progress Notes (Signed)
Remote ICD transmission.   

## 2020-03-05 DIAGNOSIS — Z23 Encounter for immunization: Secondary | ICD-10-CM | POA: Diagnosis not present

## 2020-03-07 DIAGNOSIS — I2510 Atherosclerotic heart disease of native coronary artery without angina pectoris: Secondary | ICD-10-CM | POA: Diagnosis not present

## 2020-03-07 DIAGNOSIS — E1159 Type 2 diabetes mellitus with other circulatory complications: Secondary | ICD-10-CM | POA: Diagnosis not present

## 2020-03-07 DIAGNOSIS — E039 Hypothyroidism, unspecified: Secondary | ICD-10-CM | POA: Diagnosis not present

## 2020-03-07 DIAGNOSIS — E7800 Pure hypercholesterolemia, unspecified: Secondary | ICD-10-CM | POA: Diagnosis not present

## 2020-03-07 DIAGNOSIS — I509 Heart failure, unspecified: Secondary | ICD-10-CM | POA: Diagnosis not present

## 2020-03-07 DIAGNOSIS — I10 Essential (primary) hypertension: Secondary | ICD-10-CM | POA: Diagnosis not present

## 2020-03-07 DIAGNOSIS — E782 Mixed hyperlipidemia: Secondary | ICD-10-CM | POA: Diagnosis not present

## 2020-03-11 NOTE — Progress Notes (Signed)
Cardiology Office Note:    Date:  03/12/2020   ID:  Todd [name], DOB 05/16/1946, MRN [id number]  PCP:  Lyman Bishop, DO  Cardiologist:  Sinclair Grooms, MD   Referring MD: Orpah Melter, MD   Chief Complaint  Patient presents with  . Coronary Artery Disease  . Congestive Heart Failure    History of Present Illness:    Todd [name] is a 74 y.o. 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  He is very active.  He denies chest pain.  No orthopnea or PND.  There is no peripheral edema.  He has not had syncope.  He has a microfracture in one of his pacemaker wires.  The decision is watchful waiting.  He wants Vascepa prescription changed to allow 91-month supply on each refill.  Past Medical History:  Diagnosis Date  . Coronary artery disease    Status post CABG  . Diabetes mellitus   . Hyperlipidemia   . ICD-St.Jude-CRT    Implanted 2010  . Other primary cardiomyopathies   . Syncope, vasovagal     Past Surgical History:  Procedure Laterality Date  . CORONARY STENT PLACEMENT      Current Medications: Current Meds  Medication Sig  . Alogliptin Benzoate 25 MG TABS Take 1 tablet by mouth daily.  Marland Kitchen amLODipine (NORVASC) 5 MG tablet Take 5 mg by mouth daily.  Marland Kitchen atorvastatin (LIPITOR) 40 MG tablet Take 40 mg by mouth daily.  . baclofen (LIORESAL) 10 MG tablet Take 10 mg by mouth daily.   . carvedilol (COREG) 25 MG tablet Take 25 mg by mouth 2 (two) times daily with a meal.  . cyanocobalamin 2000 MCG tablet Take 2,000 mcg by mouth daily.  . empagliflozin (JARDIANCE) 25 MG TABS tablet Take 25 mg by mouth daily.  . Esomeprazole Magnesium 20 MG TBEC Take 20 mg by mouth daily before breakfast.   . FOLIC ACID PO Take 1 tablet by mouth daily.  Marland Kitchen icosapent Ethyl (VASCEPA) 1 g capsule Take 2  capsules (2 g total) by mouth 2 (two) times daily.  Marland Kitchen levothyroxine (SYNTHROID, LEVOTHROID) 50 MCG tablet Take 50 mcg by mouth daily.  Marland Kitchen losartan (COZAAR) 100 MG tablet Take 100 mg by mouth daily.  . metFORMIN (GLUCOPHAGE) 1000 MG tablet Take 1,000 mg by mouth 2 (two) times daily with a meal.  . Multiple Vitamin (MULTIVITAMIN) capsule Take 1 capsule by mouth daily.  . pramipexole (MIRAPEX) 0.25 MG tablet Take 0.25 mg by mouth daily.  . [DISCONTINUED] icosapent Ethyl (VASCEPA) 1 g capsule Take 2 capsules (2 g total) by mouth 2 (two) times daily. Please keep upcoming appt in October with Dr. Tamala Julian before anymore refills. Thank you     Allergies:   Shellfish allergy   Social History   Socioeconomic History  . Marital status: Married    Spouse name: Not on file  . Number of children: Not on file  . Years of education: Not on file  . Highest education level: Not on file  Occupational History  . Not on file  Tobacco Use  . Smoking status: Former Smoker    Quit date: 1969    Years since quitting: 52.7  . Smokeless tobacco: Never Used  Vaping Use  . Vaping Use: Never used  Substance and Sexual Activity  . Alcohol use: Not  Currently  . Drug use: Never  . Sexual activity: Not on file  Other Topics Concern  . Not on file  Social History Narrative  . Not on file   Social Determinants of Health   Financial Resource Strain:   . Difficulty of Paying Living Expenses: Not on file  Food Insecurity:   . Worried About Charity fundraiser in the Last Year: Not on file  . Ran Out of Food in the Last Year: Not on file  Transportation Needs:   . Lack of Transportation (Medical): Not on file  . Lack of Transportation (Non-Medical): Not on file  Physical Activity:   . Days of Exercise per Week: Not on file  . Minutes of Exercise per Session: Not on file  Stress:   . Feeling of Stress : Not on file  Social Connections:   . Frequency of Communication with Friends and Family: Not on file  .  Frequency of Social Gatherings with Friends and Family: Not on file  . Attends Religious Services: Not on file  . Active Member of Clubs or Organizations: Not on file  . Attends Archivist Meetings: Not on file  . Marital Status: Not on file     Family History: The patient's family history includes Diabetes in his brother, brother, and sister; Heart disease in his brother and brother; Hyperlipidemia in his brother, brother, brother, and sister; Hypertension in his brother, brother, and brother.  ROS:   Please see the history of present illness.    He denies edema, syncope, difficulty sleeping, and palpitations.  All other systems reviewed and are negative.  EKGs/Labs/Other Studies Reviewed:    The following studies were reviewed today:  Device check February 15, 2020 was unremarkable and reviewed by Dr. Lovena Le.  EKG:  EKG AV sequential pacing.  When compared to the prior tracing of 02/17/2019, Ventricular pacing amplitude is less.  Recent Labs: 04/18/2019: ALT 18  Recent Lipid Panel    Component Value Date/Time   CHOL 132 04/18/2019 0757   TRIG 244 (H) 04/18/2019 0757   HDL 35 (L) 04/18/2019 0757   CHOLHDL 3.8 04/18/2019 0757   LDLCALC 58 04/18/2019 0757    Physical Exam:    VS:  BP 132/72   Pulse 65   Ht 5' 10.5" (1.791 m)   Wt 194 lb (88 kg)   SpO2 95%   BMI 27.44 kg/m     Wt Readings from Last 3 Encounters:  03/12/20 194 lb (88 kg)  03/18/19 187 lb (84.8 kg)  02/16/19 189 lb (85.7 kg)     GEN: Healthy-appearing younger than stated age.. No acute distress HEENT: Normal NECK: No JVD. LYMPHATICS: No lymphadenopathy CARDIAC:  RRR without murmur, gallop, or edema. VASCULAR:  Normal Pulses. No bruits. RESPIRATORY:  Clear to auscultation without rales, wheezing or rhonchi  ABDOMEN: Soft, non-tender, non-distended, No pulsatile mass, MUSCULOSKELETAL: No deformity  SKIN: Warm and dry NEUROLOGIC:  Alert and oriented x 3 PSYCHIATRIC:  Normal affect    ASSESSMENT:    1. Coronary artery disease involving coronary bypass graft of native heart with angina pectoris ([place])   2. Biventricular implantable cardioverter-defibrillator in situ   3. Essential hypertension   4. Other hyperlipidemia   5. Chronic diastolic heart failure ([place])   6. Type 2 diabetes mellitus with other circulatory complication, without long-term current use of insulin ([place])   7. Educated about COVID-19 virus infection    PLAN:    In order of problems listed above:  1. Secondary prevention discussed in detail.  Metrics including triglyceride, LDL, is at target.  A1c is higher than target.  Empagliflozin dose has been increased. 2. Micro fracture.  Being followed by EP. 3. Excellent blood pressure control for age and medical conditions. 4. LDL target less than 58. 5. No evidence of volume overload. 6. Target LDL less than 7.  Agree with recent increase in empagliflozin to 25 mg/day.  Decrease carbohydrate in diet. 7. Social distancing, mask wearing, vaccination and booster compliant.  Overall education and awareness concerning primary/secondary risk prevention was discussed in detail: LDL less than 70, hemoglobin A1c less than 7, blood pressure target less than 130/80 mmHg, >150 minutes of moderate aerobic activity per week, avoidance of smoking, weight control (via diet and exercise), and continued surveillance/management of/for obstructive sleep apnea.    Medication Adjustments/Labs and Tests Ordered: Current medicines are reviewed at length with the patient today.  Concerns regarding medicines are outlined above.  Orders Placed This Encounter  Procedures  . EKG 12-Lead   Meds ordered this encounter  Medications  . icosapent Ethyl (VASCEPA) 1 g capsule    Sig: Take 2 capsules (2 g total) by mouth 2 (two) times daily.    Dispense:  360 capsule    Refill:  3    Pt must keep upcoming appt in October with Dr. Tamala Julian before anymore refills. Thank you    Patient  Instructions  Medication Instructions:  Your physician recommends that you continue on your current medications as directed. Please refer to the Current Medication list given to you today.  *If you need a refill on your cardiac medications before your next appointment, please call your pharmacy*   Lab Work: None If you have labs (blood work) drawn today and your tests are completely normal, you will receive your results only by: Marland Kitchen MyChart Message (if you have MyChart) OR . A paper copy in the mail If you have any lab test that is abnormal or we need to change your treatment, we will call you to review the results.   Testing/Procedures: None   Follow-Up: At [facility], you and your health needs are our priority.  As part of our continuing mission to provide you with exceptional heart care, we have created designated Provider Care Teams.  These Care Teams include your primary Cardiologist (physician) and Advanced Practice Providers (APPs -  Physician Assistants and Nurse Practitioners) who all work together to provide you with the care you need, when you need it.  We recommend signing up for the patient portal called "MyChart".  Sign up information is provided on this After Visit Summary.  MyChart is used to connect with patients for Virtual Visits (Telemedicine).  Patients are able to view lab/test results, encounter notes, upcoming appointments, etc.  Non-urgent messages can be sent to your provider as well.   To learn more about what you can do with MyChart, go to [email or web address].    Your next appointment:   12 month(s)  The format for your next appointment:   In Person  Provider:   You may see Sinclair Grooms, MD or one of the following Advanced Practice Providers on your designated Care Team:    Truitt Merle, NP  Cecilie Kicks, NP  Kathyrn Drown, NP    Other Instructions      Signed, Sinclair Grooms, MD  03/12/2020 9:31 AM    [place]

## 2020-03-12 ENCOUNTER — Encounter: Payer: Self-pay | Admitting: Interventional Cardiology

## 2020-03-12 ENCOUNTER — Other Ambulatory Visit: Payer: Self-pay

## 2020-03-12 ENCOUNTER — Ambulatory Visit (INDEPENDENT_AMBULATORY_CARE_PROVIDER_SITE_OTHER): Payer: Medicare Other | Admitting: Interventional Cardiology

## 2020-03-12 VITALS — BP 132/72 | HR 65 | Ht 70.5 in | Wt 194.0 lb

## 2020-03-12 DIAGNOSIS — Z95810 Presence of automatic (implantable) cardiac defibrillator: Secondary | ICD-10-CM | POA: Diagnosis not present

## 2020-03-12 DIAGNOSIS — E7849 Other hyperlipidemia: Secondary | ICD-10-CM | POA: Diagnosis not present

## 2020-03-12 DIAGNOSIS — I25709 Atherosclerosis of coronary artery bypass graft(s), unspecified, with unspecified angina pectoris: Secondary | ICD-10-CM | POA: Diagnosis not present

## 2020-03-12 DIAGNOSIS — I5032 Chronic diastolic (congestive) heart failure: Secondary | ICD-10-CM | POA: Diagnosis not present

## 2020-03-12 DIAGNOSIS — I10 Essential (primary) hypertension: Secondary | ICD-10-CM

## 2020-03-12 DIAGNOSIS — Z7189 Other specified counseling: Secondary | ICD-10-CM | POA: Diagnosis not present

## 2020-03-12 DIAGNOSIS — E1159 Type 2 diabetes mellitus with other circulatory complications: Secondary | ICD-10-CM

## 2020-03-12 MED ORDER — ICOSAPENT ETHYL 1 G PO CAPS: 2.0000 g | ORAL_CAPSULE | Freq: Two times a day (BID) | ORAL | 3 refills | Status: DC

## 2020-03-12 NOTE — Patient Instructions (Signed)

## 2020-04-02 DIAGNOSIS — Z23 Encounter for immunization: Secondary | ICD-10-CM | POA: Diagnosis not present

## 2020-04-17 DIAGNOSIS — I10 Essential (primary) hypertension: Secondary | ICD-10-CM | POA: Diagnosis not present

## 2020-04-17 DIAGNOSIS — I509 Heart failure, unspecified: Secondary | ICD-10-CM | POA: Diagnosis not present

## 2020-04-17 DIAGNOSIS — Z794 Long term (current) use of insulin: Secondary | ICD-10-CM | POA: Diagnosis not present

## 2020-04-17 DIAGNOSIS — Z0000 Encounter for general adult medical examination without abnormal findings: Secondary | ICD-10-CM | POA: Diagnosis not present

## 2020-04-17 DIAGNOSIS — E7800 Pure hypercholesterolemia, unspecified: Secondary | ICD-10-CM | POA: Diagnosis not present

## 2020-04-17 DIAGNOSIS — E1159 Type 2 diabetes mellitus with other circulatory complications: Secondary | ICD-10-CM | POA: Diagnosis not present

## 2020-04-17 DIAGNOSIS — Z95810 Presence of automatic (implantable) cardiac defibrillator: Secondary | ICD-10-CM | POA: Diagnosis not present

## 2020-04-17 DIAGNOSIS — E039 Hypothyroidism, unspecified: Secondary | ICD-10-CM | POA: Diagnosis not present

## 2020-04-17 DIAGNOSIS — I2510 Atherosclerotic heart disease of native coronary artery without angina pectoris: Secondary | ICD-10-CM | POA: Diagnosis not present

## 2020-04-17 DIAGNOSIS — K219 Gastro-esophageal reflux disease without esophagitis: Secondary | ICD-10-CM | POA: Diagnosis not present

## 2020-04-17 DIAGNOSIS — G2581 Restless legs syndrome: Secondary | ICD-10-CM | POA: Diagnosis not present

## 2020-04-17 DIAGNOSIS — E559 Vitamin D deficiency, unspecified: Secondary | ICD-10-CM | POA: Diagnosis not present

## 2020-04-28 DIAGNOSIS — I10 Essential (primary) hypertension: Secondary | ICD-10-CM | POA: Diagnosis not present

## 2020-04-28 DIAGNOSIS — E1159 Type 2 diabetes mellitus with other circulatory complications: Secondary | ICD-10-CM | POA: Diagnosis not present

## 2020-04-28 DIAGNOSIS — I509 Heart failure, unspecified: Secondary | ICD-10-CM | POA: Diagnosis not present

## 2020-04-28 DIAGNOSIS — E039 Hypothyroidism, unspecified: Secondary | ICD-10-CM | POA: Diagnosis not present

## 2020-04-28 DIAGNOSIS — K219 Gastro-esophageal reflux disease without esophagitis: Secondary | ICD-10-CM | POA: Diagnosis not present

## 2020-04-28 DIAGNOSIS — E782 Mixed hyperlipidemia: Secondary | ICD-10-CM | POA: Diagnosis not present

## 2020-04-28 DIAGNOSIS — I2510 Atherosclerotic heart disease of native coronary artery without angina pectoris: Secondary | ICD-10-CM | POA: Diagnosis not present

## 2020-04-28 DIAGNOSIS — E7800 Pure hypercholesterolemia, unspecified: Secondary | ICD-10-CM | POA: Diagnosis not present

## 2020-05-16 ENCOUNTER — Ambulatory Visit (INDEPENDENT_AMBULATORY_CARE_PROVIDER_SITE_OTHER): Payer: Medicare Other

## 2020-05-16 DIAGNOSIS — I428 Other cardiomyopathies: Secondary | ICD-10-CM

## 2020-05-16 LAB — CUP PACEART REMOTE DEVICE CHECK
Battery Remaining Longevity: 36 mo
Battery Remaining Percentage: 44 %
Battery Voltage: 2.93 V
Brady Statistic AP VP Percent: 6 %
Brady Statistic AP VS Percent: 1 %
Brady Statistic AS VP Percent: 94 %
Brady Statistic AS VS Percent: 1 %
Brady Statistic RA Percent Paced: 5.8 %
Date Time Interrogation Session: 20211208040020
HighPow Impedance: 44 Ohm
HighPow Impedance: 44 Ohm
Implantable Lead Implant Date: 20101005
Implantable Lead Implant Date: 20101005
Implantable Lead Implant Date: 20101005
Implantable Lead Location: 753858
Implantable Lead Location: 753859
Implantable Lead Location: 753860
Implantable Lead Model: 1944
Implantable Pulse Generator Implant Date: 20171127
Lead Channel Impedance Value: 430 Ohm
Lead Channel Impedance Value: 460 Ohm
Lead Channel Impedance Value: 890 Ohm
Lead Channel Pacing Threshold Amplitude: 0.25 V
Lead Channel Pacing Threshold Amplitude: 0.75 V
Lead Channel Pacing Threshold Amplitude: 2 V
Lead Channel Pacing Threshold Pulse Width: 0.5 ms
Lead Channel Pacing Threshold Pulse Width: 0.5 ms
Lead Channel Pacing Threshold Pulse Width: 1 ms
Lead Channel Sensing Intrinsic Amplitude: 11.9 mV
Lead Channel Sensing Intrinsic Amplitude: 2.1 mV
Lead Channel Setting Pacing Amplitude: 2 V
Lead Channel Setting Pacing Amplitude: 2 V
Lead Channel Setting Pacing Amplitude: 2.5 V
Lead Channel Setting Pacing Pulse Width: 0.5 ms
Lead Channel Setting Pacing Pulse Width: 1 ms
Lead Channel Setting Sensing Sensitivity: 0.5 mV
Pulse Gen Serial Number: 7371595

## 2020-05-17 DIAGNOSIS — E7800 Pure hypercholesterolemia, unspecified: Secondary | ICD-10-CM | POA: Diagnosis not present

## 2020-05-29 NOTE — Progress Notes (Signed)
Remote ICD transmission.   

## 2020-06-07 DIAGNOSIS — L570 Actinic keratosis: Secondary | ICD-10-CM | POA: Diagnosis not present

## 2020-06-07 DIAGNOSIS — L578 Other skin changes due to chronic exposure to nonionizing radiation: Secondary | ICD-10-CM | POA: Diagnosis not present

## 2020-06-07 DIAGNOSIS — L821 Other seborrheic keratosis: Secondary | ICD-10-CM | POA: Diagnosis not present

## 2020-06-07 DIAGNOSIS — D235 Other benign neoplasm of skin of trunk: Secondary | ICD-10-CM | POA: Diagnosis not present

## 2020-06-07 DIAGNOSIS — L814 Other melanin hyperpigmentation: Secondary | ICD-10-CM | POA: Diagnosis not present

## 2020-07-03 DIAGNOSIS — I509 Heart failure, unspecified: Secondary | ICD-10-CM | POA: Diagnosis not present

## 2020-07-03 DIAGNOSIS — I2510 Atherosclerotic heart disease of native coronary artery without angina pectoris: Secondary | ICD-10-CM | POA: Diagnosis not present

## 2020-07-03 DIAGNOSIS — E1159 Type 2 diabetes mellitus with other circulatory complications: Secondary | ICD-10-CM | POA: Diagnosis not present

## 2020-07-03 DIAGNOSIS — E782 Mixed hyperlipidemia: Secondary | ICD-10-CM | POA: Diagnosis not present

## 2020-07-03 DIAGNOSIS — E7800 Pure hypercholesterolemia, unspecified: Secondary | ICD-10-CM | POA: Diagnosis not present

## 2020-07-03 DIAGNOSIS — K219 Gastro-esophageal reflux disease without esophagitis: Secondary | ICD-10-CM | POA: Diagnosis not present

## 2020-07-03 DIAGNOSIS — I10 Essential (primary) hypertension: Secondary | ICD-10-CM | POA: Diagnosis not present

## 2020-07-03 DIAGNOSIS — E039 Hypothyroidism, unspecified: Secondary | ICD-10-CM | POA: Diagnosis not present

## 2020-07-03 DIAGNOSIS — Z7984 Long term (current) use of oral hypoglycemic drugs: Secondary | ICD-10-CM | POA: Diagnosis not present

## 2020-08-01 DIAGNOSIS — E1159 Type 2 diabetes mellitus with other circulatory complications: Secondary | ICD-10-CM | POA: Diagnosis not present

## 2020-08-01 DIAGNOSIS — D126 Benign neoplasm of colon, unspecified: Secondary | ICD-10-CM | POA: Diagnosis not present

## 2020-08-01 DIAGNOSIS — Z7984 Long term (current) use of oral hypoglycemic drugs: Secondary | ICD-10-CM | POA: Diagnosis not present

## 2020-08-15 ENCOUNTER — Ambulatory Visit (INDEPENDENT_AMBULATORY_CARE_PROVIDER_SITE_OTHER): Payer: Medicare Other

## 2020-08-15 DIAGNOSIS — I428 Other cardiomyopathies: Secondary | ICD-10-CM | POA: Diagnosis not present

## 2020-08-17 LAB — CUP PACEART REMOTE DEVICE CHECK
Battery Remaining Longevity: 34 mo
Battery Remaining Percentage: 41 %
Battery Voltage: 2.92 V
Brady Statistic AP VP Percent: 6.5 %
Brady Statistic AP VS Percent: 1 %
Brady Statistic AS VP Percent: 93 %
Brady Statistic AS VS Percent: 1 %
Brady Statistic RA Percent Paced: 6.2 %
Date Time Interrogation Session: 20220309040016
HighPow Impedance: 47 Ohm
HighPow Impedance: 48 Ohm
Implantable Lead Implant Date: 20101005
Implantable Lead Implant Date: 20101005
Implantable Lead Implant Date: 20101005
Implantable Lead Location: 753858
Implantable Lead Location: 753859
Implantable Lead Location: 753860
Implantable Lead Model: 1944
Implantable Pulse Generator Implant Date: 20171127
Lead Channel Impedance Value: 430 Ohm
Lead Channel Impedance Value: 460 Ohm
Lead Channel Impedance Value: 810 Ohm
Lead Channel Pacing Threshold Amplitude: 0.25 V
Lead Channel Pacing Threshold Amplitude: 0.625 V
Lead Channel Pacing Threshold Amplitude: 2 V
Lead Channel Pacing Threshold Pulse Width: 0.5 ms
Lead Channel Pacing Threshold Pulse Width: 0.5 ms
Lead Channel Pacing Threshold Pulse Width: 1 ms
Lead Channel Sensing Intrinsic Amplitude: 1.9 mV
Lead Channel Sensing Intrinsic Amplitude: 11.9 mV
Lead Channel Setting Pacing Amplitude: 2 V
Lead Channel Setting Pacing Amplitude: 2 V
Lead Channel Setting Pacing Amplitude: 2.5 V
Lead Channel Setting Pacing Pulse Width: 0.5 ms
Lead Channel Setting Pacing Pulse Width: 1 ms
Lead Channel Setting Sensing Sensitivity: 0.5 mV
Pulse Gen Serial Number: 7371595

## 2020-08-23 NOTE — Progress Notes (Signed)
Remote ICD transmission.   

## 2020-09-06 DIAGNOSIS — K219 Gastro-esophageal reflux disease without esophagitis: Secondary | ICD-10-CM | POA: Diagnosis not present

## 2020-09-06 DIAGNOSIS — E1159 Type 2 diabetes mellitus with other circulatory complications: Secondary | ICD-10-CM | POA: Diagnosis not present

## 2020-09-06 DIAGNOSIS — E7800 Pure hypercholesterolemia, unspecified: Secondary | ICD-10-CM | POA: Diagnosis not present

## 2020-09-06 DIAGNOSIS — I509 Heart failure, unspecified: Secondary | ICD-10-CM | POA: Diagnosis not present

## 2020-09-06 DIAGNOSIS — I2510 Atherosclerotic heart disease of native coronary artery without angina pectoris: Secondary | ICD-10-CM | POA: Diagnosis not present

## 2020-09-06 DIAGNOSIS — E782 Mixed hyperlipidemia: Secondary | ICD-10-CM | POA: Diagnosis not present

## 2020-09-06 DIAGNOSIS — E039 Hypothyroidism, unspecified: Secondary | ICD-10-CM | POA: Diagnosis not present

## 2020-09-06 DIAGNOSIS — I10 Essential (primary) hypertension: Secondary | ICD-10-CM | POA: Diagnosis not present

## 2020-09-12 ENCOUNTER — Telehealth: Payer: Self-pay | Admitting: Internal Medicine

## 2020-09-12 NOTE — Telephone Encounter (Signed)
Patient called back and I have let him know his transmission was normal and patient verbalized understanding

## 2020-09-12 NOTE — Telephone Encounter (Signed)
  1. Has your device fired? No   2. Is you device beeping? No   3. Are you experiencing draining or swelling at device site? No   4. Are you calling to see if we received your device transmission? Yes, wants to know if 08/15/20 transmission was received as well as what the readings were.   5. Have you passed out? No     Please route to [place]

## 2020-09-12 NOTE — Telephone Encounter (Signed)
Returned patients phone call. Advised the transmission was received and shows no new findings. Normal device function.  No answer, LMOVM.

## 2020-10-09 DIAGNOSIS — Z23 Encounter for immunization: Secondary | ICD-10-CM | POA: Diagnosis not present

## 2020-11-14 ENCOUNTER — Ambulatory Visit (INDEPENDENT_AMBULATORY_CARE_PROVIDER_SITE_OTHER): Payer: Medicare Other

## 2020-11-14 DIAGNOSIS — I428 Other cardiomyopathies: Secondary | ICD-10-CM | POA: Diagnosis not present

## 2020-11-14 LAB — CUP PACEART REMOTE DEVICE CHECK
Battery Remaining Longevity: 30 mo
Battery Remaining Percentage: 38 %
Battery Voltage: 2.92 V
Brady Statistic AP VP Percent: 7.1 %
Brady Statistic AP VS Percent: 1 %
Brady Statistic AS VP Percent: 93 %
Brady Statistic AS VS Percent: 1 %
Brady Statistic RA Percent Paced: 6.9 %
Date Time Interrogation Session: 20220608040017
HighPow Impedance: 44 Ohm
HighPow Impedance: 44 Ohm
Implantable Lead Implant Date: 20101005
Implantable Lead Implant Date: 20101005
Implantable Lead Implant Date: 20101005
Implantable Lead Location: 753858
Implantable Lead Location: 753859
Implantable Lead Location: 753860
Implantable Lead Model: 1944
Implantable Pulse Generator Implant Date: 20171127
Lead Channel Impedance Value: 430 Ohm
Lead Channel Impedance Value: 440 Ohm
Lead Channel Impedance Value: 740 Ohm
Lead Channel Pacing Threshold Amplitude: 0.25 V
Lead Channel Pacing Threshold Amplitude: 0.625 V
Lead Channel Pacing Threshold Amplitude: 2 V
Lead Channel Pacing Threshold Pulse Width: 0.5 ms
Lead Channel Pacing Threshold Pulse Width: 0.5 ms
Lead Channel Pacing Threshold Pulse Width: 1 ms
Lead Channel Sensing Intrinsic Amplitude: 1.8 mV
Lead Channel Sensing Intrinsic Amplitude: 11.9 mV
Lead Channel Setting Pacing Amplitude: 2 V
Lead Channel Setting Pacing Amplitude: 2 V
Lead Channel Setting Pacing Amplitude: 2.5 V
Lead Channel Setting Pacing Pulse Width: 0.5 ms
Lead Channel Setting Pacing Pulse Width: 1 ms
Lead Channel Setting Sensing Sensitivity: 0.5 mV
Pulse Gen Serial Number: 7371595

## 2020-11-22 DIAGNOSIS — H0100A Unspecified blepharitis right eye, upper and lower eyelids: Secondary | ICD-10-CM | POA: Diagnosis not present

## 2020-11-22 DIAGNOSIS — H02831 Dermatochalasis of right upper eyelid: Secondary | ICD-10-CM | POA: Diagnosis not present

## 2020-11-22 DIAGNOSIS — H527 Unspecified disorder of refraction: Secondary | ICD-10-CM | POA: Diagnosis not present

## 2020-11-22 DIAGNOSIS — H02834 Dermatochalasis of left upper eyelid: Secondary | ICD-10-CM | POA: Diagnosis not present

## 2020-11-22 DIAGNOSIS — E119 Type 2 diabetes mellitus without complications: Secondary | ICD-10-CM | POA: Diagnosis not present

## 2020-11-22 DIAGNOSIS — H25813 Combined forms of age-related cataract, bilateral: Secondary | ICD-10-CM | POA: Diagnosis not present

## 2020-11-22 DIAGNOSIS — H0100B Unspecified blepharitis left eye, upper and lower eyelids: Secondary | ICD-10-CM | POA: Diagnosis not present

## 2020-11-22 DIAGNOSIS — H43813 Vitreous degeneration, bilateral: Secondary | ICD-10-CM | POA: Diagnosis not present

## 2020-12-01 DIAGNOSIS — E7800 Pure hypercholesterolemia, unspecified: Secondary | ICD-10-CM | POA: Diagnosis not present

## 2020-12-01 DIAGNOSIS — E1159 Type 2 diabetes mellitus with other circulatory complications: Secondary | ICD-10-CM | POA: Diagnosis not present

## 2020-12-01 DIAGNOSIS — K219 Gastro-esophageal reflux disease without esophagitis: Secondary | ICD-10-CM | POA: Diagnosis not present

## 2020-12-01 DIAGNOSIS — I10 Essential (primary) hypertension: Secondary | ICD-10-CM | POA: Diagnosis not present

## 2020-12-01 DIAGNOSIS — I2510 Atherosclerotic heart disease of native coronary artery without angina pectoris: Secondary | ICD-10-CM | POA: Diagnosis not present

## 2020-12-01 DIAGNOSIS — E039 Hypothyroidism, unspecified: Secondary | ICD-10-CM | POA: Diagnosis not present

## 2020-12-01 DIAGNOSIS — I509 Heart failure, unspecified: Secondary | ICD-10-CM | POA: Diagnosis not present

## 2020-12-01 DIAGNOSIS — E782 Mixed hyperlipidemia: Secondary | ICD-10-CM | POA: Diagnosis not present

## 2020-12-07 NOTE — Progress Notes (Signed)
Remote ICD transmission.   

## 2021-01-16 DIAGNOSIS — Z7984 Long term (current) use of oral hypoglycemic drugs: Secondary | ICD-10-CM | POA: Diagnosis not present

## 2021-01-16 DIAGNOSIS — D126 Benign neoplasm of colon, unspecified: Secondary | ICD-10-CM | POA: Diagnosis not present

## 2021-01-16 DIAGNOSIS — E1159 Type 2 diabetes mellitus with other circulatory complications: Secondary | ICD-10-CM | POA: Diagnosis not present

## 2021-01-22 DIAGNOSIS — E782 Mixed hyperlipidemia: Secondary | ICD-10-CM | POA: Diagnosis not present

## 2021-01-22 DIAGNOSIS — E039 Hypothyroidism, unspecified: Secondary | ICD-10-CM | POA: Diagnosis not present

## 2021-01-22 DIAGNOSIS — I10 Essential (primary) hypertension: Secondary | ICD-10-CM | POA: Diagnosis not present

## 2021-01-22 DIAGNOSIS — I2510 Atherosclerotic heart disease of native coronary artery without angina pectoris: Secondary | ICD-10-CM | POA: Diagnosis not present

## 2021-01-22 DIAGNOSIS — K219 Gastro-esophageal reflux disease without esophagitis: Secondary | ICD-10-CM | POA: Diagnosis not present

## 2021-01-22 DIAGNOSIS — E1159 Type 2 diabetes mellitus with other circulatory complications: Secondary | ICD-10-CM | POA: Diagnosis not present

## 2021-01-22 DIAGNOSIS — E7800 Pure hypercholesterolemia, unspecified: Secondary | ICD-10-CM | POA: Diagnosis not present

## 2021-01-22 DIAGNOSIS — I509 Heart failure, unspecified: Secondary | ICD-10-CM | POA: Diagnosis not present

## 2021-02-05 DIAGNOSIS — G479 Sleep disorder, unspecified: Secondary | ICD-10-CM | POA: Diagnosis not present

## 2021-02-05 DIAGNOSIS — Z7984 Long term (current) use of oral hypoglycemic drugs: Secondary | ICD-10-CM | POA: Diagnosis not present

## 2021-02-05 DIAGNOSIS — I10 Essential (primary) hypertension: Secondary | ICD-10-CM | POA: Diagnosis not present

## 2021-02-05 DIAGNOSIS — M25532 Pain in left wrist: Secondary | ICD-10-CM | POA: Diagnosis not present

## 2021-02-05 DIAGNOSIS — S20211A Contusion of right front wall of thorax, initial encounter: Secondary | ICD-10-CM | POA: Diagnosis not present

## 2021-02-05 DIAGNOSIS — Z23 Encounter for immunization: Secondary | ICD-10-CM | POA: Diagnosis not present

## 2021-02-05 DIAGNOSIS — E1159 Type 2 diabetes mellitus with other circulatory complications: Secondary | ICD-10-CM | POA: Diagnosis not present

## 2021-02-13 ENCOUNTER — Ambulatory Visit (INDEPENDENT_AMBULATORY_CARE_PROVIDER_SITE_OTHER): Payer: Medicare Other

## 2021-02-13 DIAGNOSIS — I428 Other cardiomyopathies: Secondary | ICD-10-CM

## 2021-02-13 LAB — CUP PACEART REMOTE DEVICE CHECK
Battery Remaining Longevity: 28 mo
Battery Remaining Percentage: 34 %
Battery Voltage: 2.9 V
Brady Statistic AP VP Percent: 7.4 %
Brady Statistic AP VS Percent: 1 %
Brady Statistic AS VP Percent: 92 %
Brady Statistic AS VS Percent: 1 %
Brady Statistic RA Percent Paced: 7.2 %
Date Time Interrogation Session: 20220907040018
HighPow Impedance: 45 Ohm
HighPow Impedance: 45 Ohm
Implantable Lead Implant Date: 20101005
Implantable Lead Implant Date: 20101005
Implantable Lead Implant Date: 20101005
Implantable Lead Location: 753858
Implantable Lead Location: 753859
Implantable Lead Location: 753860
Implantable Lead Model: 1944
Implantable Pulse Generator Implant Date: 20171127
Lead Channel Impedance Value: 410 Ohm
Lead Channel Impedance Value: 450 Ohm
Lead Channel Impedance Value: 760 Ohm
Lead Channel Pacing Threshold Amplitude: 0.375 V
Lead Channel Pacing Threshold Amplitude: 0.625 V
Lead Channel Pacing Threshold Amplitude: 2 V
Lead Channel Pacing Threshold Pulse Width: 0.5 ms
Lead Channel Pacing Threshold Pulse Width: 0.5 ms
Lead Channel Pacing Threshold Pulse Width: 1 ms
Lead Channel Sensing Intrinsic Amplitude: 1.7 mV
Lead Channel Sensing Intrinsic Amplitude: 11.9 mV
Lead Channel Setting Pacing Amplitude: 2 V
Lead Channel Setting Pacing Amplitude: 2 V
Lead Channel Setting Pacing Amplitude: 2.5 V
Lead Channel Setting Pacing Pulse Width: 0.5 ms
Lead Channel Setting Pacing Pulse Width: 1 ms
Lead Channel Setting Sensing Sensitivity: 0.5 mV
Pulse Gen Serial Number: 7371595

## 2021-02-14 DIAGNOSIS — E782 Mixed hyperlipidemia: Secondary | ICD-10-CM | POA: Diagnosis not present

## 2021-02-14 DIAGNOSIS — E1159 Type 2 diabetes mellitus with other circulatory complications: Secondary | ICD-10-CM | POA: Diagnosis not present

## 2021-02-14 DIAGNOSIS — E7800 Pure hypercholesterolemia, unspecified: Secondary | ICD-10-CM | POA: Diagnosis not present

## 2021-02-14 DIAGNOSIS — K219 Gastro-esophageal reflux disease without esophagitis: Secondary | ICD-10-CM | POA: Diagnosis not present

## 2021-02-14 DIAGNOSIS — I10 Essential (primary) hypertension: Secondary | ICD-10-CM | POA: Diagnosis not present

## 2021-02-14 DIAGNOSIS — E039 Hypothyroidism, unspecified: Secondary | ICD-10-CM | POA: Diagnosis not present

## 2021-02-14 DIAGNOSIS — I2510 Atherosclerotic heart disease of native coronary artery without angina pectoris: Secondary | ICD-10-CM | POA: Diagnosis not present

## 2021-02-14 DIAGNOSIS — I509 Heart failure, unspecified: Secondary | ICD-10-CM | POA: Diagnosis not present

## 2021-02-14 NOTE — Progress Notes (Signed)
Electrophysiology Office Note Date: 02/14/2021  ID:  Todd [name], Todd [name] [birth year], MRN [id number]  PCP: Lyman Bishop, DO Primary Cardiologist: Sinclair Grooms, MD Electrophysiologist: Virl Axe, MD   CC: Routine ICD follow-up  Todd [name] is a 75 y.o. male seen today for Virl Axe, MD for routine electrophysiology followup.    Last seen in office 03/18/2019.  Since last being seen in our clinic the patient reports doing very well. He had a fall several weeks ago and was worried about potentially having damaged his device. 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 Since his fall, he has had 4, single isolated hiccups on different days. Otherwise,  he denies chest pain, palpitations, dyspnea, PND, orthopnea, nausea, vomiting, dizziness, syncope, edema, weight gain, or early satiety. He has not had ICD shocks.   Device History: SJM CRT-D implanted 2010, primary prevention, gen change 05/05/16  Past Medical History:  Diagnosis Date   Coronary artery disease    Status post CABG   Diabetes mellitus    Hyperlipidemia    ICD-St.Jude-CRT    Implanted 2010   Other primary cardiomyopathies    Syncope, vasovagal    Past Surgical History:  Procedure Laterality Date   CORONARY STENT PLACEMENT      Current Outpatient Medications  Medication Sig Dispense Refill   Alogliptin Benzoate 25 MG TABS Take 1 tablet by mouth daily.     amLODipine (NORVASC) 5 MG tablet Take 5 mg by mouth daily.     atorvastatin (LIPITOR) 40 MG tablet Take 40 mg by mouth daily.     baclofen (LIORESAL) 10 MG tablet Take 10 mg by mouth daily.      carvedilol (COREG) 25 MG tablet Take 25 mg by mouth 2 (two) times daily with a meal.     cyanocobalamin 2000 MCG tablet Take 2,000 mcg by mouth daily.     empagliflozin (JARDIANCE) 25 MG TABS tablet Take 25 mg by mouth daily.     Esomeprazole  Magnesium 20 MG TBEC Take 20 mg by mouth daily before breakfast.      FOLIC ACID PO Take 1 tablet by mouth daily.     icosapent Ethyl (VASCEPA) 1 g capsule Take 2 capsules (2 g total) by mouth 2 (two) times daily. 360 capsule 3   levothyroxine (SYNTHROID, LEVOTHROID) 50 MCG tablet Take 50 mcg by mouth daily.     losartan (COZAAR) 100 MG tablet Take 100 mg by mouth daily.     metFORMIN (GLUCOPHAGE) 1000 MG tablet Take 1,000 mg by mouth 2 (two) times daily with a meal.     Multiple Vitamin (MULTIVITAMIN) capsule Take 1 capsule by mouth daily.     pramipexole (MIRAPEX) 0.25 MG tablet Take 0.25 mg by mouth daily.     No current facility-administered medications for this visit.    Allergies:   Shellfish allergy   Social History: Social History   Socioeconomic History   Marital status: Married    Spouse name: Not on file   Number of children: Not on file   Years of education: Not on file   Highest education level: Not on file  Occupational History   Not on file  Tobacco Use   Smoking status: Former    Types: Cigarettes    Quit date: 1969    Years since quitting: 53.7   Smokeless tobacco: Never  Vaping Use  Vaping Use: Never used  Substance and Sexual Activity   Alcohol use: Not Currently   Drug use: Never   Sexual activity: Not on file  Other Topics Concern   Not on file  Social History Narrative   Not on file   Social Determinants of Health   Financial Resource Strain: Not on file  Food Insecurity: Not on file  Transportation Needs: Not on file  Physical Activity: Not on file  Stress: Not on file  Social Connections: Not on file  Intimate Partner Violence: Not on file    Family History: Family History  Problem Relation Age of Onset   Diabetes Sister    Hyperlipidemia Sister    Heart disease Brother    Hypertension Brother    Hyperlipidemia Brother    Heart disease Brother    Diabetes Brother    Hypertension Brother    Hyperlipidemia Brother    Diabetes  Brother    Hypertension Brother    Hyperlipidemia Brother     Review of Systems: All other systems reviewed and are otherwise negative except as noted above.   Physical Exam: There were no vitals filed for this visit.   GEN- The patient is well appearing, alert and oriented x 3 today.   HEENT: normocephalic, atraumatic; sclera clear, conjunctiva pink; hearing intact; oropharynx clear; neck supple, no JVP Lymph- no cervical lymphadenopathy Lungs- Clear to ausculation bilaterally, normal work of breathing.  No wheezes, rales, rhonchi Heart- Regular rate and rhythm, no murmurs, rubs or gallops, PMI not laterally displaced GI- soft, non-tender, non-distended, bowel sounds present, no hepatosplenomegaly Extremities- no clubbing or cyanosis. No edema; DP/PT/radial pulses 2+ bilaterally MS- no significant deformity or atrophy Skin- warm and dry, no rash or lesion; ICD pocket well healed Psych- euthymic mood, full affect Neuro- strength and sensation are intact  ICD interrogation- reviewed in detail today,  See PACEART report  EKG:  EKG is ordered today. Personal review of EKG ordered today shows AS BiV pacing with negative deflection in Lead 1, and initial upward deflection in Lead V1. QRS ~162 ms.  Recent Labs: No results found for requested labs within last 8760 hours.   Wt Readings from Last 3 Encounters:  03/12/20 194 lb (88 kg)  03/18/19 187 lb (84.8 kg)  02/16/19 189 lb (85.7 kg)     Other studies Reviewed: Additional studies/ records that were reviewed today include: Previous EP office notes.   Assessment and Plan:  1.  Chronic systolic dysfunction s/p St. Jude CRT-D  euvolemic today Stable on an appropriate medical regimen Normal ICD function See Pace Art report No changes today Known h/o A lead noise. NOT reproducible on exam today. Most recent episodes occurred during sleep hours, possibly with patient sleeping with his left arm up under his pillow.  His device  settings, threshold, sensing, and impedances remain stable today despite fall onto RIGHT side.    2. 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 Chronic CHF (diastolic) Volume status stable on exam NYHA II symptoms.   C/w Dr. Tamala Julian   3. HTN Stable on current regimen    4. CAD Denies s/s ischemia.  C/w Dr. Tamala Julian   Current medicines are reviewed at length with the patient today.    Labs/ tests ordered today include:  Requested recent labs from PCP. (Several weeks ago per pt)   Disposition:   Follow up with Dr. Caryl Comes in 12 months   Signed, Shirley Friar, PA-C  02/14/2021 3:00 PM  [facility]  [address] [place] [address] [phone] (office) [phone] (fax)

## 2021-02-15 ENCOUNTER — Other Ambulatory Visit: Payer: Self-pay

## 2021-02-15 ENCOUNTER — Ambulatory Visit (INDEPENDENT_AMBULATORY_CARE_PROVIDER_SITE_OTHER): Payer: Medicare Other | Admitting: Student

## 2021-02-15 ENCOUNTER — Encounter: Payer: Self-pay | Admitting: Student

## 2021-02-15 VITALS — BP 132/68 | HR 67 | Ht 70.0 in | Wt 191.0 lb

## 2021-02-15 DIAGNOSIS — I428 Other cardiomyopathies: Secondary | ICD-10-CM | POA: Diagnosis not present

## 2021-02-15 DIAGNOSIS — Z95810 Presence of automatic (implantable) cardiac defibrillator: Secondary | ICD-10-CM

## 2021-02-15 DIAGNOSIS — I10 Essential (primary) hypertension: Secondary | ICD-10-CM

## 2021-02-15 DIAGNOSIS — I25709 Atherosclerosis of coronary artery bypass graft(s), unspecified, with unspecified angina pectoris: Secondary | ICD-10-CM

## 2021-02-15 LAB — CUP PACEART INCLINIC DEVICE CHECK
Battery Remaining Longevity: 28 mo
Brady Statistic RA Percent Paced: 7.1 %
Brady Statistic RV Percent Paced: 99.85 %
Date Time Interrogation Session: 20220909120829
HighPow Impedance: 48.0898
Implantable Lead Implant Date: 20101005
Implantable Lead Implant Date: 20101005
Implantable Lead Implant Date: 20101005
Implantable Lead Location: 753858
Implantable Lead Location: 753859
Implantable Lead Location: 753860
Implantable Lead Model: 1944
Implantable Pulse Generator Implant Date: 20171127
Lead Channel Impedance Value: 437.5 Ohm
Lead Channel Impedance Value: 475 Ohm
Lead Channel Impedance Value: 887.5 Ohm
Lead Channel Pacing Threshold Amplitude: 0.25 V
Lead Channel Pacing Threshold Amplitude: 0.625 V
Lead Channel Pacing Threshold Amplitude: 2 V
Lead Channel Pacing Threshold Amplitude: 2 V
Lead Channel Pacing Threshold Pulse Width: 0.5 ms
Lead Channel Pacing Threshold Pulse Width: 0.5 ms
Lead Channel Pacing Threshold Pulse Width: 1 ms
Lead Channel Pacing Threshold Pulse Width: 1 ms
Lead Channel Sensing Intrinsic Amplitude: 11.9 mV
Lead Channel Sensing Intrinsic Amplitude: 2.2 mV
Lead Channel Setting Pacing Amplitude: 2 V
Lead Channel Setting Pacing Amplitude: 2 V
Lead Channel Setting Pacing Amplitude: 2.5 V
Lead Channel Setting Pacing Pulse Width: 0.5 ms
Lead Channel Setting Pacing Pulse Width: 1 ms
Lead Channel Setting Sensing Sensitivity: 0.5 mV
Pulse Gen Serial Number: 7371595

## 2021-02-15 NOTE — Patient Instructions (Signed)
Medication Instructions:  Your physician recommends that you continue on your current medications as directed. Please refer to the Current Medication list given to you today.  *If you need a refill on your cardiac medications before your next appointment, please call your pharmacy*   Lab Work: None If you have labs (blood work) drawn today and your tests are completely normal, you will receive your results only by: [place] (if you have MyChart) OR A paper copy in the mail If you have any lab test that is abnormal or we need to change your treatment, we will call you to review the results.   Follow-Up: At [facility], you and your health needs are our priority.  As part of our continuing mission to provide you with exceptional heart care, we have created designated Provider Care Teams.  These Care Teams include your primary Cardiologist (physician) and Advanced Practice Providers (APPs -  Physician Assistants and Nurse Practitioners) who all work together to provide you with the care you need, when you need it.  We recommend signing up for the patient portal called "MyChart".  Sign up information is provided on this After Visit Summary.  MyChart is used to connect with patients for Virtual Visits (Telemedicine).  Patients are able to view lab/test results, encounter notes, upcoming appointments, etc.  Non-urgent messages can be sent to your provider as well.   To learn more about what you can do with MyChart, go to [email or web address].    Your next appointment:   1 year(s)  The format for your next appointment:   In Person  Provider:   Virl Axe, MD

## 2021-02-21 NOTE — Progress Notes (Signed)
Remote ICD transmission.   

## 2021-03-05 DIAGNOSIS — Z7984 Long term (current) use of oral hypoglycemic drugs: Secondary | ICD-10-CM | POA: Diagnosis not present

## 2021-03-05 DIAGNOSIS — E1159 Type 2 diabetes mellitus with other circulatory complications: Secondary | ICD-10-CM | POA: Diagnosis not present

## 2021-03-05 DIAGNOSIS — M25532 Pain in left wrist: Secondary | ICD-10-CM | POA: Diagnosis not present

## 2021-03-05 DIAGNOSIS — E782 Mixed hyperlipidemia: Secondary | ICD-10-CM | POA: Diagnosis not present

## 2021-03-05 DIAGNOSIS — Z79899 Other long term (current) drug therapy: Secondary | ICD-10-CM | POA: Diagnosis not present

## 2021-03-05 DIAGNOSIS — D126 Benign neoplasm of colon, unspecified: Secondary | ICD-10-CM | POA: Diagnosis not present

## 2021-03-13 DIAGNOSIS — L578 Other skin changes due to chronic exposure to nonionizing radiation: Secondary | ICD-10-CM | POA: Diagnosis not present

## 2021-03-13 DIAGNOSIS — L570 Actinic keratosis: Secondary | ICD-10-CM | POA: Diagnosis not present

## 2021-03-13 DIAGNOSIS — D1801 Hemangioma of skin and subcutaneous tissue: Secondary | ICD-10-CM | POA: Diagnosis not present

## 2021-03-13 DIAGNOSIS — L821 Other seborrheic keratosis: Secondary | ICD-10-CM | POA: Diagnosis not present

## 2021-03-22 DIAGNOSIS — M654 Radial styloid tenosynovitis [de Quervain]: Secondary | ICD-10-CM | POA: Diagnosis not present

## 2021-03-22 DIAGNOSIS — M25532 Pain in left wrist: Secondary | ICD-10-CM | POA: Diagnosis not present

## 2021-04-03 ENCOUNTER — Other Ambulatory Visit: Payer: Self-pay | Admitting: Interventional Cardiology

## 2021-04-15 NOTE — Progress Notes (Signed)
Cardiology Office Note:    Date:  04/16/2021   ID:  Todd [name], DOB [birth year], MRN [id number]  PCP:  Lyman Bishop, DO  Cardiologist:  Sinclair Grooms, MD   Referring MD: Lyman Bishop, DO   Chief Complaint  Patient presents with   Coronary Artery Disease   Congestive Heart Failure     History of Present Illness:    Todd [name] is a 75 y.o. 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  He is physically active.  Not having any issues with angina, excessive dyspnea, edema, palpitations, or medication side effects.  Had a good device clinic evaluation within the past 6 months.  He does have a microfracture in one of the leads of his device.  Past Medical History:  Diagnosis Date   Coronary artery disease    Status post CABG   Diabetes mellitus    Hyperlipidemia    ICD-St.Jude-CRT    Implanted 2010   Other primary cardiomyopathies    Syncope, vasovagal     Past Surgical History:  Procedure Laterality Date   CORONARY STENT PLACEMENT      Current Medications: Current Meds  Medication Sig   amLODipine (NORVASC) 5 MG tablet Take 5 mg by mouth daily.   aspirin 81 MG EC tablet daily.   atorvastatin (LIPITOR) 40 MG tablet Take 40 mg by mouth daily.   carvedilol (COREG) 25 MG tablet Take 25 mg by mouth 2 (two) times daily with a meal.   Cholecalciferol 50 MCG (2000 UT) TABS Take 1 tablet by mouth daily.   empagliflozin (JARDIANCE) 25 MG TABS tablet Take 25 mg by mouth daily.   Esomeprazole Magnesium 20 MG TBEC Take 20 mg by mouth 3 (three) times a week.   FOLIC ACID PO Take 1 tablet by mouth daily.   icosapent Ethyl (VASCEPA) 1 g capsule TAKE 2 CAPSULES (2 G TOTAL) BY MOUTH 2 (TWO) TIMES DAILY.   levothyroxine (SYNTHROID, LEVOTHROID) 50 MCG tablet Take 50 mcg by mouth daily.    losartan (COZAAR) 100 MG tablet Take 100 mg by mouth daily.   metFORMIN (GLUCOPHAGE) 1000 MG tablet Take 1,000 mg by mouth 2 (two) times daily with a meal.   Multiple Vitamin (MULTIVITAMIN) capsule Take 1 capsule by mouth daily.   pramipexole (MIRAPEX) 0.25 MG tablet Take 0.25 mg by mouth daily.   Semaglutide (OZEMPIC, 0.25 OR 0.5 MG/DOSE, [place]) Inject into the skin once a week.     Allergies:   Shellfish allergy   Social History   Socioeconomic History   Marital status: Married    Spouse name: Not on file   Number of children: Not on file   Years of education: Not on file   Highest education level: Not on file  Occupational History   Not on file  Tobacco Use   Smoking status: Former    Types: Cigarettes    Quit date: 1969    Years since quitting: 53.8   Smokeless tobacco: Never  Vaping Use   Vaping Use: Never used  Substance and Sexual Activity   Alcohol use: Not Currently   Drug use: Never   Sexual activity: Not on file  Other Topics Concern   Not on file  Social History Narrative   Not on file   Social Determinants of Health   Financial  Resource Strain: Not on file  Food Insecurity: Not on file  Transportation Needs: Not on file  Physical Activity: Not on file  Stress: Not on file  Social Connections: Not on file     Family History: The patient's family history includes Diabetes in his brother, brother, and sister; Heart disease in his brother and brother; Hyperlipidemia in his brother, brother, brother, and sister; Hypertension in his brother, brother, and brother.  ROS:   Please see the history of present illness.    A1c is been poorly controlled.  He feels it was all dietary.  However, semaglutide has been added to Jardiance and metformin.  Blood work will be done a little later on this month or in early December by Dr. Sheryn Bison.  A lipid panel will be done at that time.  All other systems reviewed and are negative.  EKGs/Labs/Other Studies Reviewed:    The  following studies were reviewed today: An EKG and other imaging has not been performed today.  EKG:  EKG most recently performed during the office visit in September in the device clinic.  Tracing demonstrated atrial tracking with ventricular pacing.  Recent Labs: No results found for requested labs within last 8760 hours.  Recent Lipid Panel    Component Value Date/Time   CHOL 132 04/18/2019 0757   TRIG 244 (H) 04/18/2019 0757   HDL 35 (L) 04/18/2019 0757   CHOLHDL 3.8 04/18/2019 0757   LDLCALC 58 04/18/2019 0757    Physical Exam:    VS:  BP (!) 142/82   Pulse 73   Ht 5\' 10"  (1.778 m)   Wt 190 lb 3.2 oz (86.3 kg)   SpO2 98%   BMI 27.29 kg/m     Wt Readings from Last 3 Encounters:  04/16/21 190 lb 3.2 oz (86.3 kg)  02/15/21 191 lb (86.6 kg)  03/12/20 194 lb (88 kg)     GEN: Overweight.  Appears healthy.. No acute distress HEENT: Normal NECK: No JVD. LYMPHATICS: No lymphadenopathy CARDIAC: No murmur. RRR no gallop, or edema. VASCULAR:  Normal Pulses. No bruits. RESPIRATORY:  Clear to auscultation without rales, wheezing or rhonchi  ABDOMEN: Soft, non-tender, non-distended, No pulsatile mass, MUSCULOSKELETAL: No deformity  SKIN: Warm and dry NEUROLOGIC:  Alert and oriented x 3 PSYCHIATRIC:  Normal affect   ASSESSMENT:    1. Coronary artery disease involving coronary bypass graft of native heart with angina pectoris ([place])   2. Biventricular implantable cardioverter-defibrillator in situ   3. Essential hypertension   4. Chronic diastolic heart failure (HCC)   5. Other hyperlipidemia   6. Type 2 diabetes mellitus with other circulatory complication, without long-term current use of insulin (HCC)    PLAN:    In order of problems listed above:  Secondary prevention discussed.  Greater than 150 minutes of moderate activity is emphasized. Being followed in device clinic.  We should probably get an echo before too long. Blood pressure control by weight loss, diet,  and medical therapy including carvedilol, Cozaar. Consider doing an echocardiogram in 2023. Continue Vascepa and Lipitor.  Awaiting lipid profile that will be done in December by primary. Continue triple drug therapy including Glucophage, semaglutide, and Jardiance.  Overall education and awareness concerning primary/secondary risk prevention was discussed in detail: LDL less than 70, hemoglobin A1c less than 7, blood pressure target less than 130/80 mmHg, >150 minutes of moderate aerobic activity per week, avoidance of smoking, weight control (via diet and exercise), and continued surveillance/management of/for obstructive sleep apnea.  Medication Adjustments/Labs and Tests Ordered: Current medicines are reviewed at length with the patient today.  Concerns regarding medicines are outlined above.  No orders of the defined types were placed in this encounter.  No orders of the defined types were placed in this encounter.   Patient Instructions  Medication Instructions:  Your physician recommends that you continue on your current medications as directed. Please refer to the Current Medication list given to you today.  *If you need a refill on your cardiac medications before your next appointment, please call your pharmacy*   Lab Work: None If you have labs (blood work) drawn today and your tests are completely normal, you will receive your results only by: [place] (if you have MyChart) OR A paper copy in the mail If you have any lab test that is abnormal or we need to change your treatment, we will call you to review the results.   Testing/Procedures: None   Follow-Up: At [facility], you and your health needs are our priority.  As part of our continuing mission to provide you with exceptional heart care, we have created designated Provider Care Teams.  These Care Teams include your primary Cardiologist (physician) and Advanced Practice Providers (APPs -  Physician  Assistants and Nurse Practitioners) who all work together to provide you with the care you need, when you need it.  We recommend signing up for the patient portal called "MyChart".  Sign up information is provided on this After Visit Summary.  MyChart is used to connect with patients for Virtual Visits (Telemedicine).  Patients are able to view lab/test results, encounter notes, upcoming appointments, etc.  Non-urgent messages can be sent to your provider as well.   To learn more about what you can do with MyChart, go to [email or web address].    Your next appointment:   1 year(s)  The format for your next appointment:   In Person  Provider:   Sinclair Grooms, MD     Other Instructions     Signed, Sinclair Grooms, MD  04/16/2021 4:44 PM    [place]

## 2021-04-16 ENCOUNTER — Other Ambulatory Visit: Payer: Self-pay

## 2021-04-16 ENCOUNTER — Ambulatory Visit (INDEPENDENT_AMBULATORY_CARE_PROVIDER_SITE_OTHER): Payer: Medicare Other | Admitting: Interventional Cardiology

## 2021-04-16 ENCOUNTER — Encounter: Payer: Self-pay | Admitting: Interventional Cardiology

## 2021-04-16 ENCOUNTER — Ambulatory Visit: Payer: Medicare Other | Admitting: Interventional Cardiology

## 2021-04-16 VITALS — BP 142/82 | HR 73 | Ht 70.0 in | Wt 190.2 lb

## 2021-04-16 DIAGNOSIS — I5032 Chronic diastolic (congestive) heart failure: Secondary | ICD-10-CM | POA: Diagnosis not present

## 2021-04-16 DIAGNOSIS — I10 Essential (primary) hypertension: Secondary | ICD-10-CM

## 2021-04-16 DIAGNOSIS — E7849 Other hyperlipidemia: Secondary | ICD-10-CM | POA: Diagnosis not present

## 2021-04-16 DIAGNOSIS — Z95810 Presence of automatic (implantable) cardiac defibrillator: Secondary | ICD-10-CM | POA: Diagnosis not present

## 2021-04-16 DIAGNOSIS — E1159 Type 2 diabetes mellitus with other circulatory complications: Secondary | ICD-10-CM | POA: Diagnosis not present

## 2021-04-16 DIAGNOSIS — I25709 Atherosclerosis of coronary artery bypass graft(s), unspecified, with unspecified angina pectoris: Secondary | ICD-10-CM

## 2021-04-16 NOTE — Patient Instructions (Signed)

## 2021-04-17 ENCOUNTER — Ambulatory Visit: Payer: Medicare Other | Admitting: Interventional Cardiology

## 2021-04-22 DIAGNOSIS — E119 Type 2 diabetes mellitus without complications: Secondary | ICD-10-CM | POA: Diagnosis not present

## 2021-04-22 DIAGNOSIS — I10 Essential (primary) hypertension: Secondary | ICD-10-CM | POA: Diagnosis not present

## 2021-04-22 DIAGNOSIS — N400 Enlarged prostate without lower urinary tract symptoms: Secondary | ICD-10-CM | POA: Diagnosis not present

## 2021-04-22 DIAGNOSIS — E039 Hypothyroidism, unspecified: Secondary | ICD-10-CM | POA: Diagnosis not present

## 2021-04-22 DIAGNOSIS — K219 Gastro-esophageal reflux disease without esophagitis: Secondary | ICD-10-CM | POA: Diagnosis not present

## 2021-04-22 DIAGNOSIS — E782 Mixed hyperlipidemia: Secondary | ICD-10-CM | POA: Diagnosis not present

## 2021-04-22 DIAGNOSIS — E1159 Type 2 diabetes mellitus with other circulatory complications: Secondary | ICD-10-CM | POA: Diagnosis not present

## 2021-04-22 DIAGNOSIS — I2510 Atherosclerotic heart disease of native coronary artery without angina pectoris: Secondary | ICD-10-CM | POA: Diagnosis not present

## 2021-05-06 DIAGNOSIS — Z7984 Long term (current) use of oral hypoglycemic drugs: Secondary | ICD-10-CM | POA: Diagnosis not present

## 2021-05-06 DIAGNOSIS — E1159 Type 2 diabetes mellitus with other circulatory complications: Secondary | ICD-10-CM | POA: Diagnosis not present

## 2021-05-08 DIAGNOSIS — E782 Mixed hyperlipidemia: Secondary | ICD-10-CM | POA: Diagnosis not present

## 2021-05-08 DIAGNOSIS — I2510 Atherosclerotic heart disease of native coronary artery without angina pectoris: Secondary | ICD-10-CM | POA: Diagnosis not present

## 2021-05-08 DIAGNOSIS — Z7984 Long term (current) use of oral hypoglycemic drugs: Secondary | ICD-10-CM | POA: Diagnosis not present

## 2021-05-08 DIAGNOSIS — N400 Enlarged prostate without lower urinary tract symptoms: Secondary | ICD-10-CM | POA: Diagnosis not present

## 2021-05-08 DIAGNOSIS — E039 Hypothyroidism, unspecified: Secondary | ICD-10-CM | POA: Diagnosis not present

## 2021-05-08 DIAGNOSIS — I10 Essential (primary) hypertension: Secondary | ICD-10-CM | POA: Diagnosis not present

## 2021-05-08 DIAGNOSIS — E119 Type 2 diabetes mellitus without complications: Secondary | ICD-10-CM | POA: Diagnosis not present

## 2021-05-15 ENCOUNTER — Telehealth: Payer: Self-pay | Admitting: Interventional Cardiology

## 2021-05-15 ENCOUNTER — Ambulatory Visit (INDEPENDENT_AMBULATORY_CARE_PROVIDER_SITE_OTHER): Payer: Medicare Other

## 2021-05-15 DIAGNOSIS — I428 Other cardiomyopathies: Secondary | ICD-10-CM

## 2021-05-15 LAB — CUP PACEART REMOTE DEVICE CHECK
Battery Remaining Longevity: 24 mo
Battery Remaining Percentage: 31 %
Battery Voltage: 2.9 V
Brady Statistic AP VP Percent: 1.4 %
Brady Statistic AP VS Percent: 1 %
Brady Statistic AS VP Percent: 99 %
Brady Statistic AS VS Percent: 1 %
Brady Statistic RA Percent Paced: 1.3 %
Date Time Interrogation Session: 20221207040022
HighPow Impedance: 44 Ohm
HighPow Impedance: 44 Ohm
Implantable Lead Implant Date: 20101005
Implantable Lead Implant Date: 20101005
Implantable Lead Implant Date: 20101005
Implantable Lead Location: 753858
Implantable Lead Location: 753859
Implantable Lead Location: 753860
Implantable Lead Model: 1944
Implantable Pulse Generator Implant Date: 20171127
Lead Channel Impedance Value: 410 Ohm
Lead Channel Impedance Value: 440 Ohm
Lead Channel Impedance Value: 740 Ohm
Lead Channel Pacing Threshold Amplitude: 0.25 V
Lead Channel Pacing Threshold Amplitude: 0.75 V
Lead Channel Pacing Threshold Amplitude: 2 V
Lead Channel Pacing Threshold Pulse Width: 0.5 ms
Lead Channel Pacing Threshold Pulse Width: 0.5 ms
Lead Channel Pacing Threshold Pulse Width: 1 ms
Lead Channel Sensing Intrinsic Amplitude: 1.7 mV
Lead Channel Sensing Intrinsic Amplitude: 11.9 mV
Lead Channel Setting Pacing Amplitude: 2 V
Lead Channel Setting Pacing Amplitude: 2 V
Lead Channel Setting Pacing Amplitude: 2.5 V
Lead Channel Setting Pacing Pulse Width: 0.5 ms
Lead Channel Setting Pacing Pulse Width: 1 ms
Lead Channel Setting Sensing Sensitivity: 0.5 mV
Pulse Gen Serial Number: 7371595

## 2021-05-15 NOTE — Telephone Encounter (Signed)
Patient called stating his insurance will no longer be covering VASCEPA 1 g capsule.  They will cover icosapent Ethyl.  He stated will need a new script for that.

## 2021-05-15 NOTE — Telephone Encounter (Signed)
Called pt's pharmacy to see if the issue was that the pt went into the donut hole.  Pharmacist states pt picked up name brand yesterday because it was only $200 with a coupon they had on file and the brand name was $264, so possibly in donut hole.  Spoke with pt and made him aware of this.  Explained cost next month should go back down and they know to give him generic.  Advised pt to call next month if still a cost issue.  Pt appreciative for call.

## 2021-05-24 NOTE — Progress Notes (Signed)
Remote ICD transmission.   

## 2021-07-01 DIAGNOSIS — X32XXXS Exposure to sunlight, sequela: Secondary | ICD-10-CM | POA: Diagnosis not present

## 2021-07-01 DIAGNOSIS — K219 Gastro-esophageal reflux disease without esophagitis: Secondary | ICD-10-CM | POA: Diagnosis not present

## 2021-07-01 DIAGNOSIS — Z86010 Personal history of colonic polyps: Secondary | ICD-10-CM | POA: Diagnosis not present

## 2021-07-01 DIAGNOSIS — E119 Type 2 diabetes mellitus without complications: Secondary | ICD-10-CM | POA: Diagnosis not present

## 2021-07-01 DIAGNOSIS — I2510 Atherosclerotic heart disease of native coronary artery without angina pectoris: Secondary | ICD-10-CM | POA: Diagnosis not present

## 2021-07-01 DIAGNOSIS — L570 Actinic keratosis: Secondary | ICD-10-CM | POA: Diagnosis not present

## 2021-07-01 DIAGNOSIS — L814 Other melanin hyperpigmentation: Secondary | ICD-10-CM | POA: Diagnosis not present

## 2021-07-31 DIAGNOSIS — I509 Heart failure, unspecified: Secondary | ICD-10-CM | POA: Diagnosis not present

## 2021-07-31 DIAGNOSIS — E782 Mixed hyperlipidemia: Secondary | ICD-10-CM | POA: Diagnosis not present

## 2021-07-31 DIAGNOSIS — I2510 Atherosclerotic heart disease of native coronary artery without angina pectoris: Secondary | ICD-10-CM | POA: Diagnosis not present

## 2021-07-31 DIAGNOSIS — E039 Hypothyroidism, unspecified: Secondary | ICD-10-CM | POA: Diagnosis not present

## 2021-07-31 DIAGNOSIS — K219 Gastro-esophageal reflux disease without esophagitis: Secondary | ICD-10-CM | POA: Diagnosis not present

## 2021-07-31 DIAGNOSIS — Z95810 Presence of automatic (implantable) cardiac defibrillator: Secondary | ICD-10-CM | POA: Diagnosis not present

## 2021-07-31 DIAGNOSIS — Z125 Encounter for screening for malignant neoplasm of prostate: Secondary | ICD-10-CM | POA: Diagnosis not present

## 2021-07-31 DIAGNOSIS — G2581 Restless legs syndrome: Secondary | ICD-10-CM | POA: Diagnosis not present

## 2021-07-31 DIAGNOSIS — E559 Vitamin D deficiency, unspecified: Secondary | ICD-10-CM | POA: Diagnosis not present

## 2021-07-31 DIAGNOSIS — E119 Type 2 diabetes mellitus without complications: Secondary | ICD-10-CM | POA: Diagnosis not present

## 2021-07-31 DIAGNOSIS — I10 Essential (primary) hypertension: Secondary | ICD-10-CM | POA: Diagnosis not present

## 2021-07-31 DIAGNOSIS — D122 Benign neoplasm of ascending colon: Secondary | ICD-10-CM | POA: Diagnosis not present

## 2021-08-06 DIAGNOSIS — E782 Mixed hyperlipidemia: Secondary | ICD-10-CM | POA: Diagnosis not present

## 2021-08-06 DIAGNOSIS — E119 Type 2 diabetes mellitus without complications: Secondary | ICD-10-CM | POA: Diagnosis not present

## 2021-08-06 DIAGNOSIS — Z7984 Long term (current) use of oral hypoglycemic drugs: Secondary | ICD-10-CM | POA: Diagnosis not present

## 2021-08-06 DIAGNOSIS — I10 Essential (primary) hypertension: Secondary | ICD-10-CM | POA: Diagnosis not present

## 2021-08-06 DIAGNOSIS — E559 Vitamin D deficiency, unspecified: Secondary | ICD-10-CM | POA: Diagnosis not present

## 2021-08-06 DIAGNOSIS — E039 Hypothyroidism, unspecified: Secondary | ICD-10-CM | POA: Diagnosis not present

## 2021-08-06 DIAGNOSIS — Z125 Encounter for screening for malignant neoplasm of prostate: Secondary | ICD-10-CM | POA: Diagnosis not present

## 2021-08-14 ENCOUNTER — Ambulatory Visit (INDEPENDENT_AMBULATORY_CARE_PROVIDER_SITE_OTHER): Payer: Medicare Other

## 2021-08-14 DIAGNOSIS — I428 Other cardiomyopathies: Secondary | ICD-10-CM

## 2021-08-14 LAB — CUP PACEART REMOTE DEVICE CHECK
Battery Remaining Longevity: 24 mo
Battery Remaining Percentage: 30 %
Battery Voltage: 2.89 V
Brady Statistic AP VP Percent: 1 %
Brady Statistic AP VS Percent: 1 %
Brady Statistic AS VP Percent: 99 %
Brady Statistic AS VS Percent: 1 %
Brady Statistic RA Percent Paced: 1 %
Date Time Interrogation Session: 20230308040017
HighPow Impedance: 45 Ohm
HighPow Impedance: 45 Ohm
Implantable Lead Implant Date: 20101005
Implantable Lead Implant Date: 20101005
Implantable Lead Implant Date: 20101005
Implantable Lead Location: 753858
Implantable Lead Location: 753859
Implantable Lead Location: 753860
Implantable Lead Model: 1944
Implantable Pulse Generator Implant Date: 20171127
Lead Channel Impedance Value: 410 Ohm
Lead Channel Impedance Value: 440 Ohm
Lead Channel Impedance Value: 730 Ohm
Lead Channel Pacing Threshold Amplitude: 0.25 V
Lead Channel Pacing Threshold Amplitude: 0.625 V
Lead Channel Pacing Threshold Amplitude: 2 V
Lead Channel Pacing Threshold Pulse Width: 0.5 ms
Lead Channel Pacing Threshold Pulse Width: 0.5 ms
Lead Channel Pacing Threshold Pulse Width: 1 ms
Lead Channel Sensing Intrinsic Amplitude: 1.8 mV
Lead Channel Sensing Intrinsic Amplitude: 11.9 mV
Lead Channel Setting Pacing Amplitude: 2 V
Lead Channel Setting Pacing Amplitude: 2 V
Lead Channel Setting Pacing Amplitude: 2.5 V
Lead Channel Setting Pacing Pulse Width: 0.5 ms
Lead Channel Setting Pacing Pulse Width: 1 ms
Lead Channel Setting Sensing Sensitivity: 0.5 mV
Pulse Gen Serial Number: 7371595

## 2021-08-22 DIAGNOSIS — K625 Hemorrhage of anus and rectum: Secondary | ICD-10-CM | POA: Diagnosis not present

## 2021-08-26 ENCOUNTER — Other Ambulatory Visit: Payer: Self-pay | Admitting: Gastroenterology

## 2021-08-26 DIAGNOSIS — Z86010 Personal history of colonic polyps: Secondary | ICD-10-CM | POA: Diagnosis not present

## 2021-08-26 DIAGNOSIS — K625 Hemorrhage of anus and rectum: Secondary | ICD-10-CM | POA: Diagnosis not present

## 2021-08-27 NOTE — Progress Notes (Signed)
Remote ICD transmission.   

## 2021-08-30 ENCOUNTER — Ambulatory Visit (HOSPITAL_COMMUNITY)
Admission: RE | Admit: 2021-08-30 | Discharge: 2021-08-30 | Disposition: A | Discharge status: Home / Self Care | Payer: Medicare Other | Attending: Gastroenterology | Admitting: Gastroenterology

## 2021-08-30 ENCOUNTER — Ambulatory Visit (HOSPITAL_BASED_OUTPATIENT_CLINIC_OR_DEPARTMENT_OTHER): Payer: Medicare Other | Admitting: Anesthesiology

## 2021-08-30 ENCOUNTER — Encounter (HOSPITAL_COMMUNITY)
Admission: RE | Disposition: A | Discharge status: Home / Self Care | Payer: Self-pay | Source: Home / Self Care | Attending: Gastroenterology

## 2021-08-30 ENCOUNTER — Encounter (HOSPITAL_COMMUNITY): Payer: Self-pay | Admitting: Gastroenterology

## 2021-08-30 ENCOUNTER — Other Ambulatory Visit: Payer: Self-pay

## 2021-08-30 ENCOUNTER — Ambulatory Visit (HOSPITAL_COMMUNITY): Payer: Medicare Other | Admitting: Anesthesiology

## 2021-08-30 DIAGNOSIS — I10 Essential (primary) hypertension: Secondary | ICD-10-CM | POA: Diagnosis not present

## 2021-08-30 DIAGNOSIS — Z955 Presence of coronary angioplasty implant and graft: Secondary | ICD-10-CM | POA: Insufficient documentation

## 2021-08-30 DIAGNOSIS — Z87891 Personal history of nicotine dependence: Secondary | ICD-10-CM | POA: Diagnosis not present

## 2021-08-30 DIAGNOSIS — D122 Benign neoplasm of ascending colon: Secondary | ICD-10-CM | POA: Insufficient documentation

## 2021-08-30 DIAGNOSIS — E119 Type 2 diabetes mellitus without complications: Secondary | ICD-10-CM | POA: Insufficient documentation

## 2021-08-30 DIAGNOSIS — K921 Melena: Secondary | ICD-10-CM | POA: Diagnosis not present

## 2021-08-30 DIAGNOSIS — K635 Polyp of colon: Secondary | ICD-10-CM | POA: Diagnosis not present

## 2021-08-30 DIAGNOSIS — K5730 Diverticulosis of large intestine without perforation or abscess without bleeding: Secondary | ICD-10-CM | POA: Diagnosis not present

## 2021-08-30 DIAGNOSIS — D123 Benign neoplasm of transverse colon: Secondary | ICD-10-CM | POA: Diagnosis not present

## 2021-08-30 DIAGNOSIS — D124 Benign neoplasm of descending colon: Secondary | ICD-10-CM | POA: Insufficient documentation

## 2021-08-30 DIAGNOSIS — K625 Hemorrhage of anus and rectum: Secondary | ICD-10-CM | POA: Diagnosis not present

## 2021-08-30 DIAGNOSIS — I2510 Atherosclerotic heart disease of native coronary artery without angina pectoris: Secondary | ICD-10-CM | POA: Diagnosis not present

## 2021-08-30 DIAGNOSIS — Z951 Presence of aortocoronary bypass graft: Secondary | ICD-10-CM | POA: Diagnosis not present

## 2021-08-30 HISTORY — PX: COLONOSCOPY WITH PROPOFOL: SHX5780

## 2021-08-30 HISTORY — PX: POLYPECTOMY: SHX5525

## 2021-08-30 LAB — GLUCOSE, CAPILLARY: Glucose-Capillary: 136 mg/dL — ABNORMAL HIGH (ref 70–99)

## 2021-08-30 SURGERY — COLONOSCOPY WITH PROPOFOL
Anesthesia: Monitor Anesthesia Care

## 2021-08-30 MED ORDER — LACTATED RINGERS IV SOLN: INTRAVENOUS | Status: DC

## 2021-08-30 MED ORDER — PROPOFOL 500 MG/50ML IV EMUL
INTRAVENOUS | Status: DC | PRN
  Administered 2021-08-30: 20 mg via INTRAVENOUS

## 2021-08-30 MED ORDER — SODIUM CHLORIDE 0.9 % IV SOLN
INTRAVENOUS | Status: DC
Start: 2021-08-30 — End: 2021-08-30

## 2021-08-30 MED ORDER — PROPOFOL 500 MG/50ML IV EMUL
INTRAVENOUS | Status: DC | PRN
  Administered 2021-08-30: 150 ug/kg/min via INTRAVENOUS

## 2021-08-30 SURGICAL SUPPLY — 22 items

## 2021-08-30 NOTE — Op Note (Signed)
[facility] ?Patient Name: Todd [name] ?Procedure Date: 08/30/2021 ?MRN: [id number] ?Attending MD: Carol Ada , MD ?Date of Birth: 10/23/1945 ?CSN: [id number] ?Age: 76 ?Admit Type: Outpatient ?Procedure:                Colonoscopy ?Indications:              Hematochezia ?Providers:                Carol Ada, MD, Allayne Gitelman, RN, Despina Pole,  ?                          Technician, Herbie Drape, CRNA ?Referring MD:              ?Medicines:                Propofol per Anesthesia ?Complications:            No immediate complications. ?Estimated Blood Loss:     Estimated blood loss: none. ?Procedure:                Pre-Anesthesia Assessment: ?                          - Prior to the procedure, a History and Physical  ?                          was performed, and patient medications and  ?                          allergies were reviewed. The patient's tolerance of  ?                          previous anesthesia was also reviewed. The risks  ?                          and benefits of the procedure and the sedation  ?                          options and risks were discussed with the patient.  ?                          All questions were answered, and informed consent  ?                          was obtained. Prior Anticoagulants: The patient has  ?                          taken no previous anticoagulant or antiplatelet  ?                          agents. ASA Grade Assessment: III - A patient with  ?                          severe systemic disease. After reviewing the risks  ?                          and  benefits, the patient was deemed in  ?                          satisfactory condition to undergo the procedure. ?                          - Sedation was administered by an anesthesia  ?                          professional. Deep sedation was attained. ?                          After obtaining informed consent, the colonoscope  ?                          was passed under direct vision.  Throughout the  ?                          procedure, the patient's blood pressure, pulse, and  ?                          oxygen saturations were monitored continuously. The  ?                          CF-HQ190L ([id number]) Olympus colonoscope was  ?                          introduced through the anus and advanced to the the  ?                          cecum, identified by appendiceal orifice and  ?                          ileocecal valve. The colonoscopy was performed  ?                          without difficulty. The patient tolerated the  ?                          procedure well. The quality of the bowel  ?                          preparation was evaluated using the BBPS [facility]  ?                          Bowel Preparation Scale) with scores of: Right  ?                          Colon = 3 (entire mucosa seen well with no residual  ?                          staining, small fragments of stool or opaque  ?  liquid), Transverse Colon = 3 (entire mucosa seen  ?                          well with no residual staining, small fragments of  ?                          stool or opaque liquid) and Left Colon = 3 (entire  ?                          mucosa seen well with no residual staining, small  ?                          fragments of stool or opaque liquid). The total  ?                          BBPS score equals 9. The quality of the bowel  ?                          preparation was good. The ileocecal valve,  ?                          appendiceal orifice, and rectum were photographed. ?Scope In: 1:29:08 PM ?Scope Out: 1:58:26 PM ?Scope Withdrawal Time: 0 hours 24 minutes 21 seconds  ?Total Procedure Duration: 0 hours 29 minutes 18 seconds  ?Findings: ?     Ten sessile polyps were found in the descending colon, transverse colon  ?     and ascending colon. The polyps were 3 to 8 mm in size. These polyps  ?     were removed with a cold snare. Resection and retrieval were complete. ?      Scattered small and large-mouthed diverticula were found in the sigmoid  ?     colon, descending colon and transverse colon. ?Impression:               - Ten 3 to 8 mm polyps in the descending colon, in  ?                          the transverse colon and in the ascending colon,  ?                          removed with a cold snare. Resected and retrieved. ?                          - Diverticulosis in the sigmoid colon, in the  ?                          descending colon and in the transverse colon. ?Moderate Sedation: ?     Not Applicable - Patient had care per Anesthesia. ?Recommendation:           - Patient has a contact number available for  ?                          emergencies. The signs and symptoms of potential  ?  delayed complications were discussed with the  ?                          patient. Return to normal activities tomorrow.  ?                          Written discharge instructions were provided to the  ?                          patient. ?                          - Resume previous diet. ?                          - Continue present medications. ?                          - Await pathology results. ?                          - Repeat colonoscopy in 1-2 year for surveillance. ?Procedure Code(s):        --- Professional --- ?                          [phone], Colonoscopy, flexible; with removal of  ?                          tumor(s), polyp(s), or other lesion(s) by snare  ?                          technique ?Diagnosis Code(s):        --- Professional --- ?                          K63.5, Polyp of colon ?                          K92.1, Melena (includes Hematochezia) ?                          K57.30, Diverticulosis of large intestine without  ?                          perforation or abscess without bleeding ?CPT copyright 2019 [employer] Medical Association. All rights reserved. ?The codes documented in this report are preliminary and upon coder review may  ?be revised to  meet current compliance requirements. ?Carol Ada, MD ?Carol Ada, MD ?08/30/2021 2:10:48 PM ?This report has been signed electronically. ?Number of Addenda: 0 ?

## 2021-08-30 NOTE — Transfer of Care (Signed)
Immediate Anesthesia Transfer of Care Note ? ?Patient: Todd [name] ? ?Procedure(s) Performed: COLONOSCOPY WITH PROPOFOL ?POLYPECTOMY ? ?Patient Location: PACU ? ?Anesthesia Type:MAC ? ?Level of Consciousness: sedated, patient cooperative and responds to stimulation ? ?Airway & Oxygen Therapy: Patient Spontanous Breathing and Patient connected to face mask oxygen ? ?Post-op Assessment: Report given to RN and Post -op Vital signs reviewed and stable ? ?Post vital signs: Reviewed and stable ? ?Last Vitals:  ?Vitals Value Taken Time  ?BP    ?Temp    ?Pulse    ?Resp    ?SpO2    ? ? ?Last Pain:  ?Vitals:  ? 08/30/21 1117  ?TempSrc: Oral  ?PainSc: 0-No pain  ?   ? ?  ? ?Complications: No notable events documented. ?

## 2021-08-30 NOTE — Discharge Instructions (Signed)

## 2021-08-30 NOTE — Anesthesia Preprocedure Evaluation (Addendum)
Anesthesia Evaluation  ?Patient identified by MRN, date of birth, ID band ?Patient awake ? ? ? ?Reviewed: ?Allergy & Precautions, NPO status , Patient's Chart, lab work & pertinent test results ? ?Airway ?Mallampati: II ? ?TM Distance: >3 FB ?Neck ROM: Full ? ? ? Dental ?no notable dental hx. ?(+) Teeth Intact, Dental Advisory Given ?  ?Pulmonary ?former smoker,  ?  ?Pulmonary exam normal ?breath sounds clear to auscultation ? ? ? ? ? ? Cardiovascular ?hypertension, + CAD, + Cardiac Stents and + CABG (2007)  ?Normal cardiovascular exam+ Cardiac Defibrillator ? ?Rhythm:Regular Rate:Normal ? ?NICM w recovered EF ?2017 TTE EF 55-60% ?  ?Neuro/Psych ?  ? GI/Hepatic ?  ?Endo/Other  ?diabetes ? Renal/GU ?  ? ?  ?Musculoskeletal ? ? Abdominal ?  ?Peds ? Hematology ?  ?Anesthesia Other Findings ?All [facility]elfish ? Reproductive/Obstetrics ? ?  ? ? ? ? ? ? ? ? ? ? ? ? ? ?  ?  ? ? ? ? ? ? ? ?Anesthesia Physical ?Anesthesia Plan ? ?ASA: 3 ? ?Anesthesia Plan: MAC  ? ?Post-op Pain Management:   ? ?Induction: Intravenous ? ?PONV Risk Score and Plan: Treatment may vary due to age or medical condition ? ?Airway Management Planned: Natural Airway and Awake Intubation Planned ? ?Additional Equipment: None ? ?Intra-op Plan:  ? ?Post-operative Plan:  ? ?Informed Consent: I have reviewed the patients History and Physical, chart, labs and discussed the procedure including the risks, benefits and alternatives for the proposed anesthesia with the patient or authorized representative who has indicated his/her understanding and acceptance.  ? ? ? ?Dental advisory given ? ?Plan Discussed with: CRNA and Anesthesiologist ? ?Anesthesia Plan Comments: (Colonoscopy for rectal Bleeding)  ? ? ? ? ? ?Anesthesia Quick Evaluation ? ?

## 2021-08-30 NOTE — Anesthesia Postprocedure Evaluation (Signed)
Anesthesia Post Note ? ?Patient: Todd [name] ? ?Procedure(s) Performed: COLONOSCOPY WITH PROPOFOL ?POLYPECTOMY ? ?  ? ?Patient location during evaluation: Endoscopy ?Anesthesia Type: MAC ?Level of consciousness: awake and alert ?Pain management: pain level controlled ?Vital Signs Assessment: post-procedure vital signs reviewed and stable ?Respiratory status: spontaneous breathing, nonlabored ventilation, respiratory function stable and patient connected to nasal cannula oxygen ?Cardiovascular status: blood pressure returned to baseline and stable ?Postop Assessment: no apparent nausea or vomiting ?Anesthetic complications: no ? ? ?No notable events documented. ? ?Last Vitals:  ?Vitals:  ? 08/30/21 1425 08/30/21 1430  ?BP:  (!) 142/79  ?Pulse: 66 66  ?Resp: 19 10  ?Temp:    ?SpO2: 97% 96%  ?  ?Last Pain:  ?Vitals:  ? 08/30/21 1420  ?TempSrc:   ?PainSc: 0-No pain  ? ? ?  ?  ?  ?  ?  ?  ? ?Barnet [place] ? ? ? ? ?

## 2021-08-30 NOTE — H&P (Signed)
Susanne Greenhouse ?HPI: The patient reported hematochezia earlier this week.  He as scheduled for his follow up colonoscopy in June for a personal history of a 1 cm adenoma.  Since that bleeding event he does not report any further bleeding.  No bleeding with the colonic prep.  There is a history of diverticula. ? ?Past Medical History:  ?Diagnosis Date  ? Coronary artery disease   ? Status post CABG  ? Diabetes mellitus   ? Hyperlipidemia   ? ICD-St.Jude-CRT   ? Implanted 2010  ? Other primary cardiomyopathies   ? Syncope, vasovagal   ? ? ?Past Surgical History:  ?Procedure Laterality Date  ? CORONARY STENT PLACEMENT    ? ? ?Family History  ?Problem Relation Age of Onset  ? Diabetes Sister   ? Hyperlipidemia Sister   ? Heart disease Brother   ? Hypertension Brother   ? Hyperlipidemia Brother   ? Heart disease Brother   ? Diabetes Brother   ? Hypertension Brother   ? Hyperlipidemia Brother   ? Diabetes Brother   ? Hypertension Brother   ? Hyperlipidemia Brother   ? ? ?Social History:  reports that he quit smoking about 54 years ago. His smoking use included cigarettes. He has never used smokeless tobacco. He reports that he does not currently use alcohol. He reports that he does not use drugs. ? ?Allergies:  ?Allergies  ?Allergen Reactions  ? Shellfish Allergy Hives  ? ? ?Medications: Scheduled: ?Continuous: ? sodium chloride    ? lactated ringers    ? ? ?No results found for this or any previous visit (from the past 24 hour(s)).  ? ?No results found. ? ?ROS:  As stated above in the HPI otherwise negative. ? ?There were no vitals taken for this visit.   ? ?PE: ?Gen: NAD, Alert and Oriented ?HEENT:  /AT, EOMI ?Neck: Supple, no LAD ?Lungs: CTA Bilaterally ?CV: RRR without M/G/R ?ABD: Soft, NTND, +BS ?Ext: No C/C/E ? ?Assessment/Plan: ?1) Hematochezia. ?2) Diverticula. ?3) Personal history of polyp. ? ?Plan: ?1) Colonoscopy. ? ?Benny Henrie D ?08/30/2021, 11:13 AM  ? ? ?  ? ?

## 2021-09-02 ENCOUNTER — Encounter (HOSPITAL_COMMUNITY): Payer: Self-pay | Admitting: Gastroenterology

## 2021-09-02 LAB — SURGICAL PATHOLOGY

## 2021-09-18 DIAGNOSIS — S61412A Laceration without foreign body of left hand, initial encounter: Secondary | ICD-10-CM | POA: Diagnosis not present

## 2021-10-08 DIAGNOSIS — H25813 Combined forms of age-related cataract, bilateral: Secondary | ICD-10-CM | POA: Diagnosis not present

## 2021-10-08 DIAGNOSIS — H43813 Vitreous degeneration, bilateral: Secondary | ICD-10-CM | POA: Diagnosis not present

## 2021-10-08 DIAGNOSIS — H02834 Dermatochalasis of left upper eyelid: Secondary | ICD-10-CM | POA: Diagnosis not present

## 2021-10-08 DIAGNOSIS — H527 Unspecified disorder of refraction: Secondary | ICD-10-CM | POA: Diagnosis not present

## 2021-10-08 DIAGNOSIS — E119 Type 2 diabetes mellitus without complications: Secondary | ICD-10-CM | POA: Diagnosis not present

## 2021-10-08 DIAGNOSIS — H0100A Unspecified blepharitis right eye, upper and lower eyelids: Secondary | ICD-10-CM | POA: Diagnosis not present

## 2021-10-08 DIAGNOSIS — H0100B Unspecified blepharitis left eye, upper and lower eyelids: Secondary | ICD-10-CM | POA: Diagnosis not present

## 2021-10-08 DIAGNOSIS — H02831 Dermatochalasis of right upper eyelid: Secondary | ICD-10-CM | POA: Diagnosis not present

## 2021-11-13 ENCOUNTER — Ambulatory Visit (INDEPENDENT_AMBULATORY_CARE_PROVIDER_SITE_OTHER): Payer: Medicare Other

## 2021-11-13 DIAGNOSIS — I428 Other cardiomyopathies: Secondary | ICD-10-CM | POA: Diagnosis not present

## 2021-11-14 LAB — CUP PACEART REMOTE DEVICE CHECK
Battery Remaining Longevity: 23 mo
Battery Remaining Percentage: 28 %
Battery Voltage: 2.87 V
Brady Statistic AP VP Percent: 1 %
Brady Statistic AP VS Percent: 1 %
Brady Statistic AS VP Percent: 99 %
Brady Statistic AS VS Percent: 1 %
Brady Statistic RA Percent Paced: 1 %
Date Time Interrogation Session: 20230607040016
HighPow Impedance: 50 Ohm
HighPow Impedance: 50 Ohm
Implantable Lead Implant Date: 20101005
Implantable Lead Implant Date: 20101005
Implantable Lead Implant Date: 20101005
Implantable Lead Location: 753858
Implantable Lead Location: 753859
Implantable Lead Location: 753860
Implantable Lead Model: 1944
Implantable Pulse Generator Implant Date: 20171127
Lead Channel Impedance Value: 430 Ohm
Lead Channel Impedance Value: 460 Ohm
Lead Channel Impedance Value: 850 Ohm
Lead Channel Pacing Threshold Amplitude: 0.25 V
Lead Channel Pacing Threshold Amplitude: 0.625 V
Lead Channel Pacing Threshold Amplitude: 2 V
Lead Channel Pacing Threshold Pulse Width: 0.5 ms
Lead Channel Pacing Threshold Pulse Width: 0.5 ms
Lead Channel Pacing Threshold Pulse Width: 1 ms
Lead Channel Sensing Intrinsic Amplitude: 11.9 mV
Lead Channel Sensing Intrinsic Amplitude: 2.1 mV
Lead Channel Setting Pacing Amplitude: 2 V
Lead Channel Setting Pacing Amplitude: 2 V
Lead Channel Setting Pacing Amplitude: 2.5 V
Lead Channel Setting Pacing Pulse Width: 0.5 ms
Lead Channel Setting Pacing Pulse Width: 1 ms
Lead Channel Setting Sensing Sensitivity: 0.5 mV
Pulse Gen Serial Number: 7371595

## 2021-11-22 NOTE — Progress Notes (Signed)
Remote ICD transmission.   

## 2021-12-06 DIAGNOSIS — E782 Mixed hyperlipidemia: Secondary | ICD-10-CM | POA: Diagnosis not present

## 2021-12-06 DIAGNOSIS — I10 Essential (primary) hypertension: Secondary | ICD-10-CM | POA: Diagnosis not present

## 2021-12-06 DIAGNOSIS — K219 Gastro-esophageal reflux disease without esophagitis: Secondary | ICD-10-CM | POA: Diagnosis not present

## 2021-12-06 DIAGNOSIS — I509 Heart failure, unspecified: Secondary | ICD-10-CM | POA: Diagnosis not present

## 2021-12-06 DIAGNOSIS — E039 Hypothyroidism, unspecified: Secondary | ICD-10-CM | POA: Diagnosis not present

## 2021-12-06 DIAGNOSIS — E1159 Type 2 diabetes mellitus with other circulatory complications: Secondary | ICD-10-CM | POA: Diagnosis not present

## 2022-01-20 ENCOUNTER — Telehealth: Payer: Self-pay | Admitting: Internal Medicine

## 2022-01-20 NOTE — Telephone Encounter (Signed)
Patient stated that he had been having a burning sensation in his chest.  Patient state the first time was last week and then it happened again yesterday.  Patient stated the sensation runs about 2 inches above his pacemaker.

## 2022-01-21 NOTE — Telephone Encounter (Signed)
Returned patients phone call. Patient reports of burning sensation below the device with duration 1-2 seconds 1 week ago and another episode 2 days ago that was above in location and shorter in duration than previous. Patient denies any chest pain, shortness of breath, palpitations or dizziness episodes. Requested remote transmission.   Normal device function. Lead parameters are stable. RA noise noted on observations tab, not a new finding.   Advised patient continue to monitor but I will send to Dr. Lovena Le for any further recommendations. Pt voiced understanding and appreciative of call.

## 2022-01-31 DIAGNOSIS — E1159 Type 2 diabetes mellitus with other circulatory complications: Secondary | ICD-10-CM | POA: Diagnosis not present

## 2022-01-31 DIAGNOSIS — E782 Mixed hyperlipidemia: Secondary | ICD-10-CM | POA: Diagnosis not present

## 2022-01-31 DIAGNOSIS — I10 Essential (primary) hypertension: Secondary | ICD-10-CM | POA: Diagnosis not present

## 2022-01-31 DIAGNOSIS — I2510 Atherosclerotic heart disease of native coronary artery without angina pectoris: Secondary | ICD-10-CM | POA: Diagnosis not present

## 2022-01-31 DIAGNOSIS — E039 Hypothyroidism, unspecified: Secondary | ICD-10-CM | POA: Diagnosis not present

## 2022-01-31 DIAGNOSIS — D126 Benign neoplasm of colon, unspecified: Secondary | ICD-10-CM | POA: Diagnosis not present

## 2022-01-31 DIAGNOSIS — Z0000 Encounter for general adult medical examination without abnormal findings: Secondary | ICD-10-CM | POA: Diagnosis not present

## 2022-01-31 NOTE — Telephone Encounter (Signed)
Should improve with time. No recs. If he has drainage or fever or severe swelling please bring him to be seen. GT

## 2022-02-06 NOTE — Telephone Encounter (Signed)
Attempted to contact patient to follow up. No answer, LMTCB.  CMA's~ When patient calls back could you let him know what Dr. Tanna Furry recommendations are. Thanks!

## 2022-02-11 NOTE — Progress Notes (Unsigned)
Office Visit    Patient Name: Todd [name] Date of Encounter: 02/11/2022  Primary Care Provider:  Lyman Bishop, DO Primary Cardiologist:  Sinclair Grooms, MD Primary Electrophysiologist: Virl Axe, MD  Chief Complaint    Todd [name] is a 76 y.o. 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  Past Medical History    Past Medical History:  Diagnosis Date   Coronary artery disease    Status post CABG   Diabetes mellitus    Hyperlipidemia    ICD-St.Jude-CRT    Implanted 2010   Other primary cardiomyopathies    Syncope, vasovagal    Past Surgical History:  Procedure Laterality Date   COLONOSCOPY WITH PROPOFOL N/A 08/30/2021   Procedure: COLONOSCOPY WITH PROPOFOL;  Surgeon: Carol Ada, MD;  Location: [facility];  Service: Gastroenterology;  Laterality: N/A;   CORONARY STENT PLACEMENT     POLYPECTOMY  08/30/2021   Procedure: POLYPECTOMY;  Surgeon: Carol Ada, MD;  Location: [facility];  Service: Gastroenterology;;    Allergies  Allergies  Allergen Reactions   Shellfish Allergy Hives    History of Present Illness    Todd [name] is a 76 year old male with the above-mentioned past medical history who presents today for follow-up of congestive heart failure.  Todd [name] has extensive coronary history consisting of CABG in 2007 and CRT-D placement in 2010 due to LBBB and primary prevention.  His most recent echo was done in [place], [place] that showed EF improved at 55 to 60% in 2017.  [name] was most recently seen by Dr. Tamala Julian on 04/2021 and during visit was not having any cardiac issues or complaints.  [name] was continued on current medication regimen with no changes.  [name] is currently followed by Dr. Lovena Le for ICD and current Paceart showing no abnormalities or dysfunctions.  Todd [name] presents today for  follow-up with his wife.  Since last being seen in the office patient reports [name] has been doing well from cardiac perspective but does endorse some tiredness and chest burning over his BiV device area.  [name] denies any dizziness or palpitations.  [name] denies any fever, chills, or inflammation to the area.  Blood pressure today was well controlled at 128/68.  Patient denies chest pain, palpitations, dyspnea, PND, orthopnea, nausea, vomiting, dizziness, syncope, edema, weight gain, or early satiety.  Home Medications    Current Outpatient Medications  Medication Sig Dispense Refill   acetaminophen (TYLENOL) 500 MG tablet Take 500 mg by mouth daily as needed (strenuous yard work).     amLODipine (NORVASC) 5 MG tablet Take 5 mg by mouth every evening.     aspirin 81 MG EC tablet 81 mg every evening.     atorvastatin (LIPITOR) 40 MG tablet Take 40 mg by mouth every evening.     carvedilol (COREG) 25 MG tablet Take 25 mg by mouth 2 (two) times daily with a meal.     Cholecalciferol 50 MCG (2000 UT) TABS Take 2,000 Units by mouth in the morning.     empagliflozin (JARDIANCE) 25 MG TABS tablet Take 25 mg by mouth in the morning.     Esomeprazole Magnesium 20 MG TBEC Take 20 mg by mouth 3 (three) times a week.     folic acid (FOLVITE) 1 MG tablet Take 1 mg by mouth in the morning.  icosapent Ethyl (VASCEPA) 1 g capsule TAKE 2 CAPSULES (2 G TOTAL) BY MOUTH 2 (TWO) TIMES DAILY. 360 capsule 3   levothyroxine (SYNTHROID, LEVOTHROID) 50 MCG tablet Take 50 mcg by mouth in the morning.     losartan (COZAAR) 100 MG tablet Take 100 mg by mouth in the morning.     metFORMIN (GLUCOPHAGE) 1000 MG tablet Take 1,000 mg by mouth 2 (two) times daily with a meal.     Multiple Vitamin (MULTIVITAMIN WITH MINERALS) TABS tablet Take 1 tablet by mouth in the morning.     OZEMPIC, 0.25 OR 0.5 MG/DOSE, 2 MG/1.5ML SOPN Inject 0.5 mg into the skin every Monday.     pramipexole (MIRAPEX) 0.25 MG tablet Take 0.25 mg by mouth every  evening.     No current facility-administered medications for this visit.     Review of Systems  Please see the history of present illness.    (+) Burning over left precordial area (+) Tiredness  All other systems reviewed and are otherwise negative except as noted above.  Physical Exam    Wt Readings from Last 3 Encounters:  08/30/21 176 lb (79.8 kg)  04/16/21 190 lb 3.2 oz (86.3 kg)  02/15/21 191 lb (86.6 kg)   PP:JKDTO were no vitals filed for this visit.,There is no height or weight on file to calculate BMI.  Constitutional:      Appearance: Healthy appearance. Not in distress.  Neck:     Vascular: JVD normal.  Pulmonary:     Effort: Pulmonary effort is normal.     Breath sounds: No wheezing. No rales. Diminished in the bases Cardiovascular:     Normal rate. Regular rhythm. Normal S1. Normal S2.      Murmurs: There is no murmur.  Edema:    Peripheral edema absent.  Abdominal:     Palpations: Abdomen is soft non tender. There is no hepatomegaly.  Skin:    General: Skin is warm and dry.  Neurological:     General: No focal deficit present.     Mental Status: Alert and oriented to person, place and time.     Cranial Nerves: Cranial nerves are intact.  EKG/LABS/Other Studies Reviewed    ECG personally reviewed by me today -BiV paced with rate of 78 and no acute changes noted compared to previous EKG  No results found for: "WBC", "HGB", "HCT", "MCV", "PLT" No results found for: "CREATININE", "BUN", "NA", "K", "CL", "CO2" Lab Results  Component Value Date   ALT 18 04/18/2019   AST 14 04/18/2019   ALKPHOS 50 04/18/2019   BILITOT 0.4 04/18/2019   Lab Results  Component Value Date   CHOL 132 04/18/2019   HDL 35 (L) 04/18/2019   LDLCALC 58 04/18/2019   TRIG 244 (H) 04/18/2019   CHOLHDL 3.8 04/18/2019    No results found for: "HGBA1C"  Assessment & Plan    1.  HFpEF: -Last 2D echo completed in [place] with EF of 65% -We will repeat 2D echo to  evaluate valve function and increase fatigue -Continue GDMT with losartan, Jardiance, carvedilol  2.  NICM: -Continue GDMT as noted above -Patient's blood pressure is well controlled at 128/68  3.  HTN: -Patient's blood pressure today was 128/68 -Continue carvedilol, amlodipine and losartan  4.  CAD: -Today patient endorses some burning over left precordial area that occurs with and without activity -His last ischemic evaluation was over 10+ years ago -GDMT with high intensity statin, beta-blocker, ASA -We will order  Lexiscan Myoview to evaluate for possible ischemia related to chest discomfort -We will also obtain CBC, BMP, and TSH today  5.  BiV PPM: -Today patient endorses burning and discomfort adjacent to device and surrounding pocket. -Paceart report completed with all leads and device within normal limits -I discussed case with DOD Dr. Curt Bears who was able to examine patient and determined that device is not infected or causing acute pain      Disposition: Follow-up with Sinclair Grooms, MD or APP in 6-8 weeks  Shared Decision Making/Informed Consent The risks [chest pain, shortness of breath, cardiac arrhythmias, dizziness, blood pressure fluctuations, myocardial infarction, stroke/transient ischemic attack, nausea, vomiting, allergic reaction, radiation exposure, metallic taste sensation and life-threatening complications (estimated to be 1 in 10,000)], benefits (risk stratification, diagnosing coronary artery disease, treatment guidance) and alternatives of a nuclear stress test were discussed in detail with Todd [name] and [name] agrees to proceed.   Medication Adjustments/Labs and Tests Ordered: Current medicines are reviewed at length with the patient today.  Concerns regarding medicines are outlined above.   Signed, Mable Fill, Marissa Nestle, NP 02/11/2022, 1:07 PM [place]

## 2022-02-12 ENCOUNTER — Ambulatory Visit: Payer: Medicare Other | Attending: Nurse Practitioner | Admitting: Nurse Practitioner

## 2022-02-12 ENCOUNTER — Ambulatory Visit (INDEPENDENT_AMBULATORY_CARE_PROVIDER_SITE_OTHER): Payer: Medicare Other

## 2022-02-12 ENCOUNTER — Encounter: Payer: Self-pay | Admitting: Nurse Practitioner

## 2022-02-12 VITALS — BP 128/68 | HR 80 | Ht 70.0 in | Wt 180.0 lb

## 2022-02-12 DIAGNOSIS — I5032 Chronic diastolic (congestive) heart failure: Secondary | ICD-10-CM | POA: Diagnosis not present

## 2022-02-12 DIAGNOSIS — Z95810 Presence of automatic (implantable) cardiac defibrillator: Secondary | ICD-10-CM | POA: Diagnosis not present

## 2022-02-12 DIAGNOSIS — I428 Other cardiomyopathies: Secondary | ICD-10-CM | POA: Insufficient documentation

## 2022-02-12 DIAGNOSIS — I10 Essential (primary) hypertension: Secondary | ICD-10-CM | POA: Insufficient documentation

## 2022-02-12 DIAGNOSIS — R079 Chest pain, unspecified: Secondary | ICD-10-CM | POA: Insufficient documentation

## 2022-02-12 LAB — CUP PACEART REMOTE DEVICE CHECK
Battery Remaining Longevity: 23 mo
Battery Remaining Percentage: 27 %
Battery Voltage: 2.87 V
Brady Statistic AP VP Percent: 1 %
Brady Statistic AP VS Percent: 1 %
Brady Statistic AS VP Percent: 99 %
Brady Statistic AS VS Percent: 1 %
Brady Statistic RA Percent Paced: 1 %
Date Time Interrogation Session: 20230906040019
HighPow Impedance: 44 Ohm
HighPow Impedance: 44 Ohm
Implantable Lead Implant Date: 20101005
Implantable Lead Implant Date: 20101005
Implantable Lead Implant Date: 20101005
Implantable Lead Location: 753858
Implantable Lead Location: 753859
Implantable Lead Location: 753860
Implantable Lead Model: 1944
Implantable Pulse Generator Implant Date: 20171127
Lead Channel Impedance Value: 440 Ohm
Lead Channel Impedance Value: 450 Ohm
Lead Channel Impedance Value: 850 Ohm
Lead Channel Pacing Threshold Amplitude: 0.25 V
Lead Channel Pacing Threshold Amplitude: 0.625 V
Lead Channel Pacing Threshold Amplitude: 2 V
Lead Channel Pacing Threshold Pulse Width: 0.5 ms
Lead Channel Pacing Threshold Pulse Width: 0.5 ms
Lead Channel Pacing Threshold Pulse Width: 1 ms
Lead Channel Sensing Intrinsic Amplitude: 11.9 mV
Lead Channel Sensing Intrinsic Amplitude: 2.3 mV
Lead Channel Setting Pacing Amplitude: 2 V
Lead Channel Setting Pacing Amplitude: 2 V
Lead Channel Setting Pacing Amplitude: 2.5 V
Lead Channel Setting Pacing Pulse Width: 0.5 ms
Lead Channel Setting Pacing Pulse Width: 1 ms
Lead Channel Setting Sensing Sensitivity: 0.5 mV
Pulse Gen Serial Number: 7371595

## 2022-02-12 NOTE — Patient Instructions (Signed)
Medication Instructions:  Your physician recommends that you continue on your current medications as directed. Please refer to the Current Medication list given to you today.  *If you need a refill on your cardiac medications before your next appointment, please call your pharmacy*   Lab Work: Bmet, CBC, TSH If you have labs (blood work) drawn today and your tests are completely normal, you will receive your results only by: [place] (if you have MyChart) OR A paper copy in the mail If you have any lab test that is abnormal or we need to change your treatment, we will call you to review the results.   Testing/Procedures: ECHO Your physician has requested that you have an echocardiogram. Echocardiography is a painless test that uses sound waves to create images of your heart. It provides your doctor with information about the size and shape of your heart and how well your heart's chambers and valves are working. This procedure takes approximately one hour. There are no restrictions for this procedure.  Stress test Your physician has requested that you have a lexiscan myoview. For further information please visit [email or web address]. Please follow instruction sheet, as given.    Follow-Up: At [facility], you and your health needs are our priority.  As part of our continuing mission to provide you with exceptional heart care, we have created designated Provider Care Teams.  These Care Teams include your primary Cardiologist (physician) and Advanced Practice Providers (APPs -  Physician Assistants and Nurse Practitioners) who all work together to provide you with the care you need, when you need it.  We recommend signing up for the patient portal called "MyChart".  Sign up information is provided on this After Visit Summary.  MyChart is used to connect with patients for Virtual Visits (Telemedicine).  Patients are able to view lab/test results, encounter notes, upcoming  appointments, etc.  Non-urgent messages can be sent to your provider as well.   To learn more about what you can do with MyChart, go to [email or web address].    Your next appointment:   6 week(s)  The format for your next appointment:   In Person  Provider:   Ambrose Pancoast, NP

## 2022-02-13 LAB — BASIC METABOLIC PANEL
BUN/Creatinine Ratio: 17 (ref 10–24)
BUN: 16 mg/dL (ref 8–27)
CO2: 21 mmol/L (ref 20–29)
Calcium: 10 mg/dL (ref 8.6–10.2)
Chloride: 101 mmol/L (ref 96–106)
Creatinine, Ser: 0.92 mg/dL (ref 0.76–1.27)
Glucose: 136 mg/dL — ABNORMAL HIGH (ref 70–99)
Potassium: 4.4 mmol/L (ref 3.5–5.2)
Sodium: 141 mmol/L (ref 134–144)
eGFR: 86 mL/min/{1.73_m2} (ref >59)

## 2022-02-13 LAB — TSH: TSH: 2.28 u[IU]/mL (ref 0.450–4.500)

## 2022-02-13 LAB — CBC
Hematocrit: 44 % (ref 37.5–51.0)
Hemoglobin: 14.6 g/dL (ref 13.0–17.7)
MCH: 28.9 pg (ref 26.6–33.0)
MCHC: 33.2 g/dL (ref 31.5–35.7)
MCV: 87 fL (ref 79–97)
Platelets: 181 10*3/uL (ref 150–450)
RBC: 5.05 x10E6/uL (ref 4.14–5.80)
RDW: 14.3 % (ref 11.6–15.4)
WBC: 7.4 10*3/uL (ref 3.4–10.8)

## 2022-02-13 NOTE — Addendum Note (Signed)
Addended by: Tempie Donning on: 02/13/2022 08:11 AM   Modules accepted: Orders

## 2022-02-20 ENCOUNTER — Telehealth: Payer: Self-pay

## 2022-02-20 NOTE — Telephone Encounter (Signed)
Spoke with the patient, detailed instructions given. He stated that he would be here for his test. Asked to call back with any questions. S.Chemeka Filice EMTP 

## 2022-02-25 ENCOUNTER — Ambulatory Visit (HOSPITAL_COMMUNITY): Payer: Medicare Other | Attending: Cardiovascular Disease

## 2022-02-25 ENCOUNTER — Ambulatory Visit (HOSPITAL_BASED_OUTPATIENT_CLINIC_OR_DEPARTMENT_OTHER): Payer: Medicare Other

## 2022-02-25 DIAGNOSIS — I5032 Chronic diastolic (congestive) heart failure: Secondary | ICD-10-CM | POA: Insufficient documentation

## 2022-02-25 DIAGNOSIS — R079 Chest pain, unspecified: Secondary | ICD-10-CM | POA: Insufficient documentation

## 2022-02-25 LAB — MYOCARDIAL PERFUSION IMAGING
LV dias vol: 112 mL (ref 62–150)
LV sys vol: 52 mL
Nuc Stress EF: 54 %
Peak HR: 91 {beats}/min
Rest HR: 71 {beats}/min
Rest Nuclear Isotope Dose: 10.5 mCi
SDS: 7
SRS: 2
SSS: 9
ST Depression (mm): 0 mm
Stress Nuclear Isotope Dose: 32.7 mCi
TID: 1.11

## 2022-02-25 LAB — ECHOCARDIOGRAM COMPLETE
Area-P 1/2: 2.67 cm2
Height: 70 in
S' Lateral: 3.3 cm
Weight: 2880 oz

## 2022-02-25 MED ORDER — TECHNETIUM TC 99M TETROFOSMIN IV KIT
10.5000 | PACK | Freq: Once | INTRAVENOUS | Status: AC | PRN
  Administered 2022-02-25: 10.5 via INTRAVENOUS

## 2022-02-25 MED ORDER — REGADENOSON 0.4 MG/5ML IV SOLN
0.4000 mg | Freq: Once | INTRAVENOUS | Status: AC
  Administered 2022-02-25: 0.4 mg via INTRAVENOUS

## 2022-02-25 MED ORDER — TECHNETIUM TC 99M TETROFOSMIN IV KIT
32.7000 | PACK | Freq: Once | INTRAVENOUS | Status: AC | PRN
  Administered 2022-02-25: 32.7 via INTRAVENOUS

## 2022-02-26 DIAGNOSIS — Z23 Encounter for immunization: Secondary | ICD-10-CM | POA: Diagnosis not present

## 2022-03-03 NOTE — Telephone Encounter (Signed)
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  Due to your previous ischemia your test was considered intermediate risk.  There were no treatment changes needed at this time.  This is reassuring that your shortness of breath is not related to any new ischemic changes.   Plan: Continue current medication as prescribed"  Pt thanked me for this information and had no further questions.

## 2022-03-03 NOTE — Progress Notes (Signed)
Remote ICD transmission.   

## 2022-03-07 DIAGNOSIS — E039 Hypothyroidism, unspecified: Secondary | ICD-10-CM | POA: Diagnosis not present

## 2022-03-07 DIAGNOSIS — K219 Gastro-esophageal reflux disease without esophagitis: Secondary | ICD-10-CM | POA: Diagnosis not present

## 2022-03-07 DIAGNOSIS — E782 Mixed hyperlipidemia: Secondary | ICD-10-CM | POA: Diagnosis not present

## 2022-03-07 DIAGNOSIS — E1159 Type 2 diabetes mellitus with other circulatory complications: Secondary | ICD-10-CM | POA: Diagnosis not present

## 2022-03-07 DIAGNOSIS — I10 Essential (primary) hypertension: Secondary | ICD-10-CM | POA: Diagnosis not present

## 2022-03-17 ENCOUNTER — Encounter: Payer: Self-pay | Admitting: Internal Medicine

## 2022-03-17 ENCOUNTER — Ambulatory Visit: Payer: Medicare Other | Attending: Internal Medicine | Admitting: Internal Medicine

## 2022-03-17 VITALS — BP 124/62 | HR 75 | Ht 70.0 in | Wt 182.2 lb

## 2022-03-17 DIAGNOSIS — L578 Other skin changes due to chronic exposure to nonionizing radiation: Secondary | ICD-10-CM | POA: Diagnosis not present

## 2022-03-17 DIAGNOSIS — I509 Heart failure, unspecified: Secondary | ICD-10-CM

## 2022-03-17 DIAGNOSIS — L570 Actinic keratosis: Secondary | ICD-10-CM | POA: Diagnosis not present

## 2022-03-17 DIAGNOSIS — D225 Melanocytic nevi of trunk: Secondary | ICD-10-CM | POA: Diagnosis not present

## 2022-03-17 DIAGNOSIS — I428 Other cardiomyopathies: Secondary | ICD-10-CM | POA: Diagnosis not present

## 2022-03-17 DIAGNOSIS — D485 Neoplasm of uncertain behavior of skin: Secondary | ICD-10-CM | POA: Diagnosis not present

## 2022-03-17 DIAGNOSIS — D235 Other benign neoplasm of skin of trunk: Secondary | ICD-10-CM | POA: Diagnosis not present

## 2022-03-17 DIAGNOSIS — Z95810 Presence of automatic (implantable) cardiac defibrillator: Secondary | ICD-10-CM | POA: Diagnosis not present

## 2022-03-17 DIAGNOSIS — E663 Overweight: Secondary | ICD-10-CM | POA: Diagnosis not present

## 2022-03-17 NOTE — Progress Notes (Unsigned)
Office Visit    Patient Name: Todd [name] Date of Encounter: 03/17/2022  Primary Care Provider:  Lyman Bishop, DO Primary Cardiologist:  Sinclair Grooms, MD Primary Electrophysiologist: Virl Axe, MD  Chief Complaint    TIRAN SAUSEDA is a 76 y.o. 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  Past Medical History    Past Medical History:  Diagnosis Date   Coronary artery disease    Status post CABG   Diabetes mellitus    Hyperlipidemia    ICD-St.Jude-CRT    Implanted 2010   Other primary cardiomyopathies    Syncope, vasovagal    Past Surgical History:  Procedure Laterality Date   COLONOSCOPY WITH PROPOFOL N/A 08/30/2021   Procedure: COLONOSCOPY WITH PROPOFOL;  Surgeon: Carol Ada, MD;  Location: [facility];  Service: Gastroenterology;  Laterality: N/A;   CORONARY STENT PLACEMENT     POLYPECTOMY  08/30/2021   Procedure: POLYPECTOMY;  Surgeon: Carol Ada, MD;  Location: [facility];  Service: Gastroenterology;;    Allergies  Allergies  Allergen Reactions   Shellfish Allergy Hives    History of Present Illness     Todd [name] is a 76 year old male with the above-mentioned past medical history who presents today for follow-up of congestive heart failure.  [name] has extensive coronary history consisting of CABG in 2007 and CRT-D placement in 2010 due to LBBB and primary prevention.  His most recent echo was done in [place], [place] that showed EF improved at 55 to 60% in 2017. He was most recently seen by Dr. Tamala Julian on 04/2021 and during visit was not having any cardiac issues or complaints.  He was continued on current medication regimen with no changes.  He is currently followed by Dr. Lovena Le for ICD and current Paceart showing no abnormalities or dysfunctions.   Since last being seen in the office  patient reports***.  Patient denies chest pain, palpitations, dyspnea, PND, orthopnea, nausea, vomiting, dizziness, syncope, edema, weight gain, or early satiety.  ***Notes:  Home Medications    Current Outpatient Medications  Medication Sig Dispense Refill   acetaminophen (TYLENOL) 500 MG tablet Take 500 mg by mouth daily as needed (strenuous yard work).     amLODipine (NORVASC) 5 MG tablet Take 5 mg by mouth every evening.     aspirin 81 MG EC tablet 81 mg every evening.     atorvastatin (LIPITOR) 40 MG tablet Take 40 mg by mouth every evening.     carvedilol (COREG) 25 MG tablet Take 25 mg by mouth 2 (two) times daily with a meal.     Cholecalciferol 50 MCG (2000 UT) TABS Take 2,000 Units by mouth in the morning.     empagliflozin (JARDIANCE) 25 MG TABS tablet Take 25 mg by mouth in the morning.     Esomeprazole Magnesium 20 MG TBEC Take 20 mg by mouth daily.     folic acid (FOLVITE) 1 MG tablet Take 1 mg by mouth in the morning.     icosapent Ethyl (VASCEPA) 1 g capsule TAKE 2 CAPSULES (2 G TOTAL) BY MOUTH 2 (TWO) TIMES DAILY. 360 capsule 3   levothyroxine (SYNTHROID, LEVOTHROID) 50 MCG tablet Take 50 mcg by mouth in the morning.     losartan (COZAAR) 100 MG tablet Take 100 mg by mouth in the morning.  metFORMIN (GLUCOPHAGE) 1000 MG tablet Take 1,000 mg by mouth 2 (two) times daily with a meal.     Multiple Vitamin (MULTIVITAMIN WITH MINERALS) TABS tablet Take 1 tablet by mouth in the morning.     OZEMPIC, 0.25 OR 0.5 MG/DOSE, 2 MG/1.5ML SOPN Inject 0.5 mg into the skin every Monday.     pramipexole (MIRAPEX) 0.25 MG tablet Take 0.25 mg by mouth every evening.     No current facility-administered medications for this visit.     Review of Systems  Please see the history of present illness.    (+)*** (+)***  All other systems reviewed and are otherwise negative except as noted above.  Physical Exam    Wt Readings from Last 3 Encounters:  02/25/22 180 lb (81.6 kg)  02/12/22  180 lb (81.6 kg)  08/30/21 176 lb (79.8 kg)   EG:BTDVV were no vitals filed for this visit.,There is no height or weight on file to calculate BMI.  Constitutional:      Appearance: Healthy appearance. Not in distress.  Neck:     Vascular: JVD normal.  Pulmonary:     Effort: Pulmonary effort is normal.     Breath sounds: No wheezing. No rales. Diminished in the bases Cardiovascular:     Normal rate. Regular rhythm. Normal S1. Normal S2.      Murmurs: There is no murmur.  Edema:    Peripheral edema absent.  Abdominal:     Palpations: Abdomen is soft non tender. There is no hepatomegaly.  Skin:    General: Skin is warm and dry.  Neurological:     General: No focal deficit present.     Mental Status: Alert and oriented to person, place and time.     Cranial Nerves: Cranial nerves are intact.  EKG/LABS/Other Studies Reviewed    ECG personally reviewed by me today - ***  Risk Assessment/Calculations:   {Does this patient have ATRIAL FIBRILLATION?:[id number]}        Lab Results  Component Value Date   WBC 7.4 02/12/2022   HGB 14.6 02/12/2022   HCT 44.0 02/12/2022   MCV 87 02/12/2022   PLT 181 02/12/2022   Lab Results  Component Value Date   CREATININE 0.92 02/12/2022   BUN 16 02/12/2022   NA 141 02/12/2022   K 4.4 02/12/2022   CL 101 02/12/2022   CO2 21 02/12/2022   Lab Results  Component Value Date   ALT 18 04/18/2019   AST 14 04/18/2019   ALKPHOS 50 04/18/2019   BILITOT 0.4 04/18/2019   Lab Results  Component Value Date   CHOL 132 04/18/2019   HDL 35 (L) 04/18/2019   LDLCALC 58 04/18/2019   TRIG 244 (H) 04/18/2019   CHOLHDL 3.8 04/18/2019    No results found for: "HGBA1C"  Assessment & Plan    1.  HFpEF: -Last 2D echo completed in [place] with EF of 65% -We will repeat 2D echo to evaluate valve function and increase fatigue -Continue GDMT with losartan, Jardiance, carvedilol   2.  NICM: -Continue GDMT as noted above -Patient's blood  pressure is well controlled at 128/68   3.  HTN: -Patient's blood pressure today was 128/68 -Continue carvedilol, amlodipine and losartan   4.  CAD: -Today patient endorses some burning over left precordial area that occurs with and without activity -His last ischemic evaluation was over 10+ years ago -GDMT with high intensity statin, beta-blocker, ASA -We will order Lexiscan Myoview to evaluate for possible ischemia  related to chest discomfort -We will also obtain CBC, BMP, and TSH today   5.  BiV PPM: -Today patient endorses burning and discomfort adjacent to device and surrounding pocket. -Paceart report completed with all leads and device within normal limits -I discussed case with DOD Dr. Curt Bears who was able to examine patient and determined that device is not infected or causing acute pain        Disposition: Follow-up with Belva Crome III, MD or APP in *** months {Are you ordering a CV Procedure (e.g. stress test, cath, DCCV, TEE, etc)?   Press F2        :[id number]}   Medication Adjustments/Labs and Tests Ordered: Current medicines are reviewed at length with the patient today.  Concerns regarding medicines are outlined above.   Signed, Mable Fill, Marissa Nestle, NP 03/17/2022, 11:39 AM [facility]  Note:  This document was prepared using Dragon voice recognition software and may include unintentional dictation errors.

## 2022-03-17 NOTE — Progress Notes (Signed)
Patient Care Team: Lyman Bishop, DO as PCP - General (Family Medicine) Belva Crome, MD as PCP - Cardiology (Cardiology) Deboraha Sprang, MD as PCP - Electrophysiology (Cardiology)   HPI  Todd [name] is a 76 y.o. male seen to reestablish device follow-up.  He underwent Abbott CRT-ICD implantation in 2010 for primary prevention, His ejection fraction at that time was about 20%. generator replacement 11/17. He has a history of coronary artery disease with prior stenting and and single-vessel CABG.    He has lost 40 pounds.  He has previously CPAP treated sleep apnea but no longer snores.  The patient denies chest pain, shortness of breath, nocturnal dyspnea, orthopnea or peripheral edema.  There have been no palpitations, lightheadedness or syncope.      DATE TEST EF   2010 Echo  20 %   1/17 Echo  60%   9/23 Myoview 54%   9/23 Echo  55%    Date Cr K Hgb  9/23 0.92 4.4 14.6           Past Medical History:  Diagnosis Date   Coronary artery disease    Status post CABG   Diabetes mellitus    Hyperlipidemia    ICD-St.Jude-CRT    Implanted 2010   Other primary cardiomyopathies    Syncope, vasovagal     Past Surgical History:  Procedure Laterality Date   COLONOSCOPY WITH PROPOFOL N/A 08/30/2021   Procedure: COLONOSCOPY WITH PROPOFOL;  Surgeon: Carol Ada, MD;  Location: [facility];  Service: Gastroenterology;  Laterality: N/A;   CORONARY STENT PLACEMENT     POLYPECTOMY  08/30/2021   Procedure: POLYPECTOMY;  Surgeon: Carol Ada, MD;  Location: [facility];  Service: Gastroenterology;;    Current Outpatient Medications  Medication Sig Dispense Refill   acetaminophen (TYLENOL) 500 MG tablet Take 500 mg by mouth daily as needed (strenuous yard work).     amLODipine (NORVASC) 5 MG tablet Take 5 mg by mouth every evening.     aspirin 81 MG EC tablet 81 mg every evening.     atorvastatin (LIPITOR) 40 MG tablet Take 40 mg by mouth every evening.      carvedilol (COREG) 25 MG tablet Take 25 mg by mouth 2 (two) times daily with a meal.     Cholecalciferol 50 MCG (2000 UT) TABS Take 2,000 Units by mouth in the morning.     empagliflozin (JARDIANCE) 25 MG TABS tablet Take 25 mg by mouth in the morning.     Esomeprazole Magnesium 20 MG TBEC Take 20 mg by mouth daily.     folic acid (FOLVITE) 1 MG tablet Take 1 mg by mouth in the morning.     icosapent Ethyl (VASCEPA) 1 g capsule TAKE 2 CAPSULES (2 G TOTAL) BY MOUTH 2 (TWO) TIMES DAILY. 360 capsule 3   levothyroxine (SYNTHROID, LEVOTHROID) 50 MCG tablet Take 50 mcg by mouth in the morning.     losartan (COZAAR) 100 MG tablet Take 100 mg by mouth in the morning.     metFORMIN (GLUCOPHAGE) 1000 MG tablet Take 1,000 mg by mouth 2 (two) times daily with a meal.     Multiple Vitamin (MULTIVITAMIN WITH MINERALS) TABS tablet Take 1 tablet by mouth in the morning.     OZEMPIC, 0.25 OR 0.5 MG/DOSE, 2 MG/1.5ML SOPN Inject 0.5 mg into the skin every Monday.     pramipexole (MIRAPEX) 0.25 MG tablet Take 0.25 mg by mouth every evening.     No current  facility-administered medications for this visit.    Allergies  Allergen Reactions   Shellfish Allergy Hives       Review of Systems negative except from HPI and PMH  Physical Exam BP 124/62   Pulse 75   Ht '5\' 10"'$  (1.778 m)   Wt 182 lb 3.2 oz (82.6 kg)   SpO2 98%   BMI 26.14 kg/m   Well developed and well nourished in no acute distress HENT normal Neck supple with JVP-flat Clear Device pocket well healed; without hematoma or erythema.  There is no tethering  Regular rate and rhythm, no murmur Abd-soft with active BS No Clubbing cyanosis   edema Skin-warm and dry A & Oriented  Grossly normal sensory and motor function  ECG P-synchronous/ AV  pacing  Neg QRS lead 1 and neg lead V1    Assessment and  Plan Ischemic cardiomyopathy status post CABG  CRT-D-Saint Jude  Atrial lead noise  Hypertension  Diabetes   Blood pressures well  controlled.  Continue amlodipine 5 carvedilol 25 losartan 100 No chest pain.  No symptoms of ischemia.  Continue aspirin and he is also on atorvastatin  Noise on his atrial lead is being tracked but then mode switch, at this point nothing to do.

## 2022-03-17 NOTE — Patient Instructions (Signed)
Medication Instructions:  Your physician recommends that you continue on your current medications as directed. Please refer to the Current Medication list given to you today.  *If you need a refill on your cardiac medications before your next appointment, please call your pharmacy*   Lab Work: None ordered.  If you have labs (blood work) drawn today and your tests are completely normal, you will receive your results only by: MyChart Message (if you have MyChart) OR A paper copy in the mail If you have any lab test that is abnormal or we need to change your treatment, we will call you to review the results.   Testing/Procedures: None ordered.    Follow-Up: At [facility], you and your health needs are our priority.  As part of our continuing mission to provide you with exceptional heart care, we have created designated Provider Care Teams.  These Care Teams include your primary Cardiologist (physician) and Advanced Practice Providers (APPs -  Physician Assistants and Nurse Practitioners) who all work together to provide you with the care you need, when you need it.  We recommend signing up for the patient portal called "MyChart".  Sign up information is provided on this After Visit Summary.  MyChart is used to connect with patients for Virtual Visits (Telemedicine).  Patients are able to view lab/test results, encounter notes, upcoming appointments, etc.  Non-urgent messages can be sent to your provider as well.   To learn more about what you can do with MyChart, go to [email or web address].    Your next appointment:   12 months with Dr Klein  Important Information About Sugar       

## 2022-03-18 LAB — CUP PACEART INCLINIC DEVICE CHECK
Battery Remaining Longevity: 24 mo
Brady Statistic RA Percent Paced: 1 % — CL
Date Time Interrogation Session: 20231009090951
Implantable Lead Implant Date: 20101005
Implantable Lead Implant Date: 20101005
Implantable Lead Implant Date: 20101005
Implantable Lead Location: 753858
Implantable Lead Location: 753859
Implantable Lead Location: 753860
Implantable Lead Model: 1944
Implantable Pulse Generator Implant Date: 20171127
Lead Channel Pacing Threshold Amplitude: 0.5 V
Lead Channel Pacing Threshold Amplitude: 0.75 V
Lead Channel Pacing Threshold Amplitude: 2.25 V
Lead Channel Pacing Threshold Pulse Width: 0.5 ms
Lead Channel Pacing Threshold Pulse Width: 0.5 ms
Lead Channel Pacing Threshold Pulse Width: 1 ms
Lead Channel Sensing Intrinsic Amplitude: 1.7 mV
Lead Channel Sensing Intrinsic Amplitude: 11.9 mV
Pulse Gen Serial Number: 7371595

## 2022-03-18 NOTE — Addendum Note (Signed)
Addended by: Michelle Nasuti on: 03/18/2022 09:44 AM   Modules accepted: Orders

## 2022-03-19 ENCOUNTER — Encounter: Payer: Self-pay | Admitting: Nurse Practitioner

## 2022-03-19 ENCOUNTER — Ambulatory Visit: Payer: Medicare Other | Attending: Nurse Practitioner | Admitting: Nurse Practitioner

## 2022-03-19 VITALS — BP 116/68 | HR 76 | Ht 70.0 in | Wt 182.0 lb

## 2022-03-19 DIAGNOSIS — I428 Other cardiomyopathies: Secondary | ICD-10-CM | POA: Diagnosis not present

## 2022-03-19 NOTE — Patient Instructions (Signed)
Medication Instructions:   Your physician recommends that you continue on your current medications as directed. Please refer to the Current Medication list given to you today.   *If you need a refill on your cardiac medications before your next appointment, please call your pharmacy*   Lab Work:  None ordered.  If you have labs (blood work) drawn today and your tests are completely normal, you will receive your results only by: [place] (if you have MyChart) OR A paper copy in the mail If you have any lab test that is abnormal or we need to change your treatment, we will call you to review the results.   Testing/Procedures:  None ordered.   Follow-Up: At [facility], you and your health needs are our priority.  As part of our continuing mission to provide you with exceptional heart care, we have created designated Provider Care Teams.  These Care Teams include your primary Cardiologist (physician) and Advanced Practice Providers (APPs -  Physician Assistants and Nurse Practitioners) who all work together to provide you with the care you need, when you need it.  We recommend signing up for the patient portal called "MyChart".  Sign up information is provided on this After Visit Summary.  MyChart is used to connect with patients for Virtual Visits (Telemedicine).  Patients are able to view lab/test results, encounter notes, upcoming appointments, etc.  Non-urgent messages can be sent to your provider as well.   To learn more about what you can do with MyChart, go to [email or web address].    Your next appointment:   2 month(s)  The format for your next appointment:   In Person  Provider:   Sinclair Grooms, MD     Important Information About Sugar

## 2022-04-01 ENCOUNTER — Encounter: Payer: Medicare Other | Admitting: Internal Medicine

## 2022-05-12 NOTE — Progress Notes (Unsigned)
Cardiology Office Note:    Date:  05/13/2022   ID:  Todd [name], DOB 1945-12-22, MRN [id number]  PCP:  Todd Bishop, DO  Cardiologist:  Todd Grooms, MD   Referring MD: Todd Bishop, DO   Chief Complaint  Patient presents with   Congestive Heart Failure   Coronary Artery Disease   Hypertension   Hyperlipidemia    History of Present Illness:    Todd [name] is a 76 y.o. 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   He is doing well.  No complaints today.  Came in to see me 1 last time.  Earlier this year was having some burning in the left subclavicular area where his pacemaker is located.  Testing was done by Todd Pancoast, NP.  Echo and nuclear studies did not reveal any high risk features.  LVEF is preserved at 50 to 55%.  The discomfort resolved spontaneously after a while.  He is cutting down trees and doing other physically active things without limitations.  No nitroglycerin use.  Past Medical History:  Diagnosis Date   Coronary artery disease    Status post CABG   Diabetes mellitus    Hyperlipidemia    ICD-St.Jude-CRT    Implanted 2010   Other primary cardiomyopathies    Syncope, vasovagal     Past Surgical History:  Procedure Laterality Date   COLONOSCOPY WITH PROPOFOL N/A 08/30/2021   Procedure: COLONOSCOPY WITH PROPOFOL;  Surgeon: Todd Ada, MD;  Location: [facility];  Service: Gastroenterology;  Laterality: N/A;   CORONARY STENT PLACEMENT     POLYPECTOMY  08/30/2021   Procedure: POLYPECTOMY;  Surgeon: Todd Ada, MD;  Location: [facility];  Service: Gastroenterology;;    Current Medications: Current Meds  Medication Sig   acetaminophen (TYLENOL) 500 MG tablet Take 500 mg by mouth daily as needed (strenuous yard work).   amLODipine (NORVASC) 5 MG  tablet Take 5 mg by mouth every evening.   aspirin 81 MG EC tablet 81 mg every evening.   atorvastatin (LIPITOR) 40 MG tablet Take 40 mg by mouth every evening.   carvedilol (COREG) 25 MG tablet Take 25 mg by mouth 2 (two) times daily with a meal.   Cholecalciferol 50 MCG (2000 UT) TABS Take 2,000 Units by mouth in the morning.   empagliflozin (JARDIANCE) 25 MG TABS tablet Take 25 mg by mouth in the morning.   Esomeprazole Magnesium 20 MG TBEC Take 20 mg by mouth daily.   folic acid (FOLVITE) 1 MG tablet Take 1 mg by mouth in the morning.   icosapent Ethyl (VASCEPA) 1 g capsule TAKE 2 CAPSULES (2 G TOTAL) BY MOUTH 2 (TWO) TIMES DAILY.   levothyroxine (SYNTHROID, LEVOTHROID) 50 MCG tablet Take 50 mcg by mouth in the morning.   losartan (COZAAR) 100 MG tablet Take 100 mg by mouth in the morning.   metFORMIN (GLUCOPHAGE) 1000 MG tablet Take 1,000 mg by mouth 2 (two) times daily with a meal.   Multiple Vitamin (MULTIVITAMIN WITH MINERALS) TABS tablet Take 1 tablet by mouth in the morning.   OZEMPIC, 0.25 OR 0.5 MG/DOSE, 2 MG/1.5ML SOPN Inject 0.5 mg into the skin every Monday.   pramipexole (MIRAPEX) 0.25 MG tablet Take 0.25 mg by mouth every evening.     Allergies:   Shellfish allergy   Social  History   Socioeconomic History   Marital status: Married    Spouse name: Not on file   Number of children: Not on file   Years of education: Not on file   Highest education level: Not on file  Occupational History   Not on file  Tobacco Use   Smoking status: Former    Types: Cigarettes    Quit date: 1969    Years since quitting: 54.9   Smokeless tobacco: Never  Vaping Use   Vaping Use: Never used  Substance and Sexual Activity   Alcohol use: Not Currently   Drug use: Never   Sexual activity: Not on file  Other Topics Concern   Not on file  Social History Narrative   Not on file   Social Determinants of Health   Financial Resource Strain: Not on file  Food Insecurity: Not on file   Transportation Needs: Not on file  Physical Activity: Not on file  Stress: Not on file  Social Connections: Not on file     Family History: The patient's family history includes Diabetes in his brother, brother, and sister; Heart disease in his brother and brother; Hyperlipidemia in his brother, brother, brother, and sister; Hypertension in his brother, brother, and brother.  ROS:   Please see the history of present illness.    No blood in the urine or stool.  Appetite has been good.  He is on both semaglutide and empagliflozin.  Hemoglobin A1c will be repeated a little later this year or early next year by Todd [name].  All other systems reviewed and are negative.  EKGs/Labs/Other Studies Reviewed:    The following studies were reviewed today:  LEXISCAN Myoview 02/25/2022: Study Highlights   Findings are consistent with prior myocardial infarction with peri-infarct ischemia. The study is intermediate risk due to ischemia.   No ST deviation was noted.   LV perfusion is abnormal. Defect 1: There is a medium defect with severe reduction in uptake present in the apical to basal inferior location(s) that is partially reversible. There is abnormal wall motion in the defect area. Consistent with infarction and peri-infarct ischemia.   Left ventricular function is grossly normal. Nuclear stress EF: 54 %. The left ventricular ejection fraction is mildly decreased (45-54%). End diastolic cavity size is normal. End systolic cavity size is normal.   Prior study not available for comparison.   ECG rhythm shows ventricular pacing.  ECHOCARDIOGRAM 02/25/2022: IMPRESSIONS   1. Small linear density seen on the pacemaker lead in the RA. Suspect  this represents thrombus which commonly is seen on these leads. If there  are clinical concerns for endocarditis would check blood cultures and  obtain a TEE. Best seen on image 58.   2. Left ventricular ejection fraction, by estimation, is 50 to 55%.  The  left ventricle has low normal function. The left ventricle has no regional  wall motion abnormalities. Left ventricular diastolic parameters are  consistent with Grade I diastolic  dysfunction (impaired relaxation).   3. Right ventricular systolic function is normal. The right ventricular  size is mildly enlarged. There is normal pulmonary artery systolic  pressure. The estimated right ventricular systolic pressure is 76.1 mmHg.   4. The mitral valve is grossly normal. Mild mitral valve regurgitation.  No evidence of mitral stenosis.   5. The aortic valve is tricuspid. Aortic valve regurgitation is not  visualized. No aortic stenosis is present.   6. The inferior vena cava is normal in size with  greater than 50%  respiratory variability, suggesting right atrial pressure of 3 mmHg.    EKG:  EKG was not performed.  Recent Labs: 02/12/2022: BUN 16; Creatinine, Ser 0.92; Hemoglobin 14.6; Platelets 181; Potassium 4.4; Sodium 141; TSH 2.280  Recent Lipid Panel    Component Value Date/Time   CHOL 132 04/18/2019 0757   TRIG 244 (H) 04/18/2019 0757   HDL 35 (L) 04/18/2019 0757   CHOLHDL 3.8 04/18/2019 0757   LDLCALC 58 04/18/2019 0757    Physical Exam:    VS:  BP 104/60   Pulse 76   Ht '5\' 10"'$  (1.778 m)   Wt 177 lb 9.6 oz (80.6 kg)   SpO2 98%   BMI 25.48 kg/m     Wt Readings from Last 3 Encounters:  05/13/22 177 lb 9.6 oz (80.6 kg)  03/19/22 182 lb (82.6 kg)  03/17/22 182 lb 3.2 oz (82.6 kg)     GEN: Healthy appearing with weight down approximately 5 pounds compared to October. No acute distress HEENT: Normal NECK: No JVD. LYMPHATICS: No lymphadenopathy CARDIAC: No murmur. RRR no gallop, or edema. VASCULAR:  Normal Pulses. No bruits. RESPIRATORY:  Clear to auscultation without rales, wheezing or rhonchi  ABDOMEN: Soft, non-tender, non-distended, No pulsatile mass, MUSCULOSKELETAL: No deformity  SKIN: Warm and dry NEUROLOGIC:  Alert and oriented x 3 PSYCHIATRIC:   Normal affect   ASSESSMENT:    1. 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 Chronic diastolic heart failure ([place])   3. Biventricular defibrillator-St. Jude   4. Other hyperlipidemia   5. Essential hypertension    PLAN:    In order of problems listed above:  Secondary prevention reviewed. Continue Jardiance, as are, and carvedilol. Being followed in the device clinic under the direction of Dr. Caryl Comes. Continue high intensity statin therapy, atorvastatin 40 mg/day. Continue amlodipine, carvedilol, Jardiance, Cozaar.  May need to give some consideration to decreasing the dose of amlodipine to allow a little more blood pressure and minimize the risk of fainting.  At his age target blood pressure is 120 to 563 mmHg systolic and 60 to 80 mmHg diastolic.  Overall education and awareness concerning primary/secondary risk prevention was discussed in detail: LDL less than 70, hemoglobin A1c less than 7, blood pressure target less than 130/80 mmHg, >150 minutes of moderate aerobic activity per week, avoidance of smoking, weight control (via diet and exercise), and continued surveillance/management of/for obstructive sleep apnea.   I recommend Dr. Darlina Guys as his cardiologist going forward and that the mediator other in the summer 2024 unless symptoms require earlier follow-up.   Medication Adjustments/Labs and Tests Ordered: Current medicines are reviewed at length with the patient today.  Concerns regarding medicines are outlined above.  No orders of the defined types were placed in this encounter.  No orders of the defined types were placed in this encounter.   Patient Instructions  Medication Instructions:  Your physician recommends that you continue on your current medications as directed. Please refer to the Current Medication list given to you today.  *If you need a refill on your cardiac medications before your next  appointment, please call your pharmacy*  Follow-Up: At [facility], you and your health needs are our priority.  As part of our continuing mission to provide you with exceptional heart care, we have created designated Provider Care Teams.  These Care Teams include your primary Cardiologist (physician) and Advanced Practice Providers (APPs -  Physician Assistants and Nurse Practitioners) who all work together to provide you with the care you need, when you need it.  Your next appointment:   6 month(s)  The format for your next appointment:   In Person  Provider:   Lauree Chandler, MD  Important Information About Sugar         Signed, Todd Grooms, MD  05/13/2022 9:29 AM    [place]

## 2022-05-13 ENCOUNTER — Encounter: Payer: Self-pay | Admitting: Interventional Cardiology

## 2022-05-13 ENCOUNTER — Ambulatory Visit: Payer: Medicare Other | Attending: Interventional Cardiology | Admitting: Interventional Cardiology

## 2022-05-13 VITALS — BP 104/60 | HR 76 | Ht 70.0 in | Wt 177.6 lb

## 2022-05-13 DIAGNOSIS — I25709 Atherosclerosis of coronary artery bypass graft(s), unspecified, with unspecified angina pectoris: Secondary | ICD-10-CM | POA: Insufficient documentation

## 2022-05-13 DIAGNOSIS — I5032 Chronic diastolic (congestive) heart failure: Secondary | ICD-10-CM | POA: Diagnosis not present

## 2022-05-13 DIAGNOSIS — E7849 Other hyperlipidemia: Secondary | ICD-10-CM | POA: Insufficient documentation

## 2022-05-13 DIAGNOSIS — I10 Essential (primary) hypertension: Secondary | ICD-10-CM | POA: Diagnosis not present

## 2022-05-13 DIAGNOSIS — Z95810 Presence of automatic (implantable) cardiac defibrillator: Secondary | ICD-10-CM | POA: Insufficient documentation

## 2022-05-13 NOTE — Patient Instructions (Signed)
Medication Instructions:  Your physician recommends that you continue on your current medications as directed. Please refer to the Current Medication list given to you today.  *If you need a refill on your cardiac medications before your next appointment, please call your pharmacy*  Follow-Up: At [facility], you and your health needs are our priority.  As part of our continuing mission to provide you with exceptional heart care, we have created designated Provider Care Teams.  These Care Teams include your primary Cardiologist (physician) and Advanced Practice Providers (APPs -  Physician Assistants and Nurse Practitioners) who all work together to provide you with the care you need, when you need it.  Your next appointment:   6 month(s)  The format for your next appointment:   In Person  Provider:   Lauree Chandler, MD  Important Information About Sugar

## 2022-05-14 ENCOUNTER — Ambulatory Visit (INDEPENDENT_AMBULATORY_CARE_PROVIDER_SITE_OTHER): Payer: Medicare Other

## 2022-05-14 DIAGNOSIS — I428 Other cardiomyopathies: Secondary | ICD-10-CM

## 2022-05-15 LAB — CUP PACEART REMOTE DEVICE CHECK
Battery Remaining Longevity: 19 mo
Battery Remaining Percentage: 23 %
Battery Voltage: 2.8 V
Brady Statistic AP VP Percent: 1 %
Brady Statistic AP VS Percent: 1 %
Brady Statistic AS VP Percent: 99 %
Brady Statistic AS VS Percent: 1 %
Brady Statistic RA Percent Paced: 1 %
Date Time Interrogation Session: 20231207015017
HighPow Impedance: 48 Ohm
HighPow Impedance: 48 Ohm
Implantable Lead Connection Status: 753985
Implantable Lead Connection Status: 753985
Implantable Lead Connection Status: 753985
Implantable Lead Implant Date: 20101005
Implantable Lead Implant Date: 20101005
Implantable Lead Implant Date: 20101005
Implantable Lead Location: 753858
Implantable Lead Location: 753859
Implantable Lead Location: 753860
Implantable Lead Model: 1944
Implantable Pulse Generator Implant Date: 20171127
Lead Channel Impedance Value: 440 Ohm
Lead Channel Impedance Value: 480 Ohm
Lead Channel Impedance Value: 850 Ohm
Lead Channel Pacing Threshold Amplitude: 0.25 V
Lead Channel Pacing Threshold Amplitude: 0.625 V
Lead Channel Pacing Threshold Amplitude: 2 V
Lead Channel Pacing Threshold Pulse Width: 0.5 ms
Lead Channel Pacing Threshold Pulse Width: 0.5 ms
Lead Channel Pacing Threshold Pulse Width: 1 ms
Lead Channel Sensing Intrinsic Amplitude: 11.9 mV
Lead Channel Sensing Intrinsic Amplitude: 2.4 mV
Lead Channel Setting Pacing Amplitude: 2 V
Lead Channel Setting Pacing Amplitude: 2 V
Lead Channel Setting Pacing Amplitude: 2.5 V
Lead Channel Setting Pacing Pulse Width: 0.5 ms
Lead Channel Setting Pacing Pulse Width: 1 ms
Lead Channel Setting Sensing Sensitivity: 0.5 mV
Pulse Gen Serial Number: 7371595

## 2022-06-06 NOTE — Progress Notes (Signed)
Remote ICD transmission.   

## 2022-07-19 DIAGNOSIS — U071 COVID-19: Secondary | ICD-10-CM | POA: Diagnosis not present

## 2022-08-13 ENCOUNTER — Ambulatory Visit: Payer: Medicare Other

## 2022-08-13 ENCOUNTER — Telehealth: Payer: Self-pay | Admitting: *Deleted

## 2022-08-13 DIAGNOSIS — Z91018 Allergy to other foods: Secondary | ICD-10-CM | POA: Diagnosis not present

## 2022-08-13 DIAGNOSIS — I428 Other cardiomyopathies: Secondary | ICD-10-CM | POA: Diagnosis not present

## 2022-08-13 DIAGNOSIS — K219 Gastro-esophageal reflux disease without esophagitis: Secondary | ICD-10-CM | POA: Diagnosis not present

## 2022-08-13 DIAGNOSIS — J301 Allergic rhinitis due to pollen: Secondary | ICD-10-CM | POA: Diagnosis not present

## 2022-08-13 DIAGNOSIS — J3089 Other allergic rhinitis: Secondary | ICD-10-CM | POA: Diagnosis not present

## 2022-08-13 DIAGNOSIS — L500 Allergic urticaria: Secondary | ICD-10-CM | POA: Diagnosis not present

## 2022-08-13 DIAGNOSIS — E119 Type 2 diabetes mellitus without complications: Secondary | ICD-10-CM | POA: Diagnosis not present

## 2022-08-13 DIAGNOSIS — Z1211 Encounter for screening for malignant neoplasm of colon: Secondary | ICD-10-CM | POA: Diagnosis not present

## 2022-08-13 DIAGNOSIS — J3081 Allergic rhinitis due to animal (cat) (dog) hair and dander: Secondary | ICD-10-CM | POA: Diagnosis not present

## 2022-08-13 LAB — CUP PACEART REMOTE DEVICE CHECK
Battery Remaining Longevity: 23 mo
Battery Remaining Percentage: 28 %
Battery Voltage: 2.84 V
Brady Statistic AP VP Percent: 1 %
Brady Statistic AP VS Percent: 1 %
Brady Statistic AS VP Percent: 99 %
Brady Statistic AS VS Percent: 1 %
Brady Statistic RA Percent Paced: 1 %
Date Time Interrogation Session: 20240306040017
HighPow Impedance: 44 Ohm
HighPow Impedance: 44 Ohm
Implantable Lead Connection Status: 753985
Implantable Lead Connection Status: 753985
Implantable Lead Connection Status: 753985
Implantable Lead Implant Date: 20101005
Implantable Lead Implant Date: 20101005
Implantable Lead Implant Date: 20101005
Implantable Lead Location: 753858
Implantable Lead Location: 753859
Implantable Lead Location: 753860
Implantable Lead Model: 1944
Implantable Pulse Generator Implant Date: 20171127
Lead Channel Impedance Value: 440 Ohm
Lead Channel Impedance Value: 480 Ohm
Lead Channel Impedance Value: 890 Ohm
Lead Channel Pacing Threshold Amplitude: 0.25 V
Lead Channel Pacing Threshold Amplitude: 0.625 V
Lead Channel Pacing Threshold Amplitude: 2 V
Lead Channel Pacing Threshold Pulse Width: 0.5 ms
Lead Channel Pacing Threshold Pulse Width: 0.5 ms
Lead Channel Pacing Threshold Pulse Width: 1 ms
Lead Channel Sensing Intrinsic Amplitude: 11.9 mV
Lead Channel Sensing Intrinsic Amplitude: 2.9 mV
Lead Channel Setting Pacing Amplitude: 2 V
Lead Channel Setting Pacing Amplitude: 2 V
Lead Channel Setting Pacing Amplitude: 2.5 V
Lead Channel Setting Pacing Pulse Width: 0.5 ms
Lead Channel Setting Pacing Pulse Width: 1 ms
Lead Channel Setting Sensing Sensitivity: 0.5 mV
Pulse Gen Serial Number: 7371595

## 2022-08-13 NOTE — Telephone Encounter (Signed)
   Name: Todd [name]  DOB: Sep 13, 1945  MRN: [id number]  Primary Cardiologist: Sinclair Grooms, MD (Inactive)   Preoperative team, please contact this patient and set up a phone call appointment for further preoperative risk assessment. Please obtain consent and complete medication review. Thank you for your help.  I confirm that guidance regarding antiplatelet and oral anticoagulation therapy has been completed and, if necessary, noted below.  Patient's aspirin is not prescribed by cardiology.  Recommendations for holding aspirin will need to come from prescribing provider.   Deberah Pelton, NP 08/13/2022, 1:33 PM [place]

## 2022-08-13 NOTE — Telephone Encounter (Signed)
   Pre-operative Risk Assessment    Patient Name: Todd [name]  DOB: 1945/08/12 MRN: [id number]      Request for Surgical Clearance    Procedure:   Colonoscopy  Date of Surgery:  Clearance 10/17/22                                 Surgeon:  Dr. Saralyn Pilar D. Irine Seal Group or Practice Name:  [facility], [place] Phone number:  [phone] Fax number:  [phone]   Type of Clearance Requested:   - Medical  - Pharmacy:  Hold Aspirin Not Indicated.   Type of Anesthesia:   Propofol   Additional requests/questions:    Signed, [name]   08/13/2022, 12:26 PM

## 2022-08-14 ENCOUNTER — Telehealth: Payer: Self-pay

## 2022-08-14 NOTE — Telephone Encounter (Signed)
  Patient Consent for Virtual Visit        Todd [name] has provided verbal consent on 08/14/2022 for a virtual visit (video or telephone).   CONSENT FOR VIRTUAL VISIT FOR:  Todd [name]  By participating in this virtual visit I agree to the following:  I hereby voluntarily request, consent and authorize [facility] and its employed or contracted physicians, physician assistants, nurse practitioners or other licensed health care professionals (the Practitioner), to provide me with telemedicine health care services (the "Services") as deemed necessary by the treating Practitioner. I acknowledge and consent to receive the Services by the Practitioner via telemedicine. I understand that the telemedicine visit will involve communicating with the Practitioner through live audiovisual communication technology and the disclosure of certain medical information by electronic transmission. I acknowledge that I have been given the opportunity to request an in-person assessment or other available alternative prior to the telemedicine visit and am voluntarily participating in the telemedicine visit.  I understand that I have the right to withhold or withdraw my consent to the use of telemedicine in the course of my care at any time, without affecting my right to future care or treatment, and that the Practitioner or I may terminate the telemedicine visit at any time. I understand that I have the right to inspect all information obtained and/or recorded in the course of the telemedicine visit and may receive copies of available information for a reasonable fee.  I understand that some of the potential risks of receiving the Services via telemedicine include:  Delay or interruption in medical evaluation due to technological equipment failure or disruption; Information transmitted may not be sufficient (e.g. poor resolution of images) to allow for appropriate medical decision making by the  Practitioner; and/or  In rare instances, security protocols could fail, causing a breach of personal health information.  Furthermore, I acknowledge that it is my responsibility to provide information about my medical history, conditions and care that is complete and accurate to the best of my ability. I acknowledge that Practitioner's advice, recommendations, and/or decision may be based on factors not within their control, such as incomplete or inaccurate data provided by me or distortions of diagnostic images or specimens that may result from electronic transmissions. I understand that the practice of medicine is not an exact science and that Practitioner makes no warranties or guarantees regarding treatment outcomes. I acknowledge that a copy of this consent can be made available to me via my patient portal ([facility]), or I can request a printed copy by calling the office of [facility].    I understand that my insurance will be billed for this visit.   I have read or had this consent read to me. I understand the contents of this consent, which adequately explains the benefits and risks of the Services being provided via telemedicine.  I have been provided ample opportunity to ask questions regarding this consent and the Services and have had my questions answered to my satisfaction. I give my informed consent for the services to be provided through the use of telemedicine in my medical care

## 2022-08-14 NOTE — Telephone Encounter (Signed)
Spoke with patient who is agreeable to do a tele visit on 4/15 at 9:20. Consent has been done, med rec needs to be completed.

## 2022-08-28 DIAGNOSIS — J3081 Allergic rhinitis due to animal (cat) (dog) hair and dander: Secondary | ICD-10-CM | POA: Diagnosis not present

## 2022-08-28 DIAGNOSIS — J3089 Other allergic rhinitis: Secondary | ICD-10-CM | POA: Diagnosis not present

## 2022-08-28 DIAGNOSIS — Z91018 Allergy to other foods: Secondary | ICD-10-CM | POA: Diagnosis not present

## 2022-08-28 DIAGNOSIS — J301 Allergic rhinitis due to pollen: Secondary | ICD-10-CM | POA: Diagnosis not present

## 2022-09-03 DIAGNOSIS — J301 Allergic rhinitis due to pollen: Secondary | ICD-10-CM | POA: Diagnosis not present

## 2022-09-03 DIAGNOSIS — J3081 Allergic rhinitis due to animal (cat) (dog) hair and dander: Secondary | ICD-10-CM | POA: Diagnosis not present

## 2022-09-03 DIAGNOSIS — J3089 Other allergic rhinitis: Secondary | ICD-10-CM | POA: Diagnosis not present

## 2022-09-16 NOTE — Progress Notes (Signed)
Remote ICD transmission.   

## 2022-09-22 ENCOUNTER — Ambulatory Visit: Payer: Medicare Other | Attending: Cardiovascular Disease

## 2022-09-22 DIAGNOSIS — Z01810 Encounter for preprocedural cardiovascular examination: Secondary | ICD-10-CM

## 2022-09-22 NOTE — Progress Notes (Signed)
Virtual Visit via Telephone Note   Because of Todd [name]'s co-morbid illnesses, he is at least at moderate risk for complications without adequate follow up.  This format is felt to be most appropriate for this patient at this time.  The patient did not have access to video technology/had technical difficulties with video requiring transitioning to audio format only (telephone).  All issues noted in this document were discussed and addressed.  No physical exam could be performed with this format.  Please refer to the patient's chart for his consent to telehealth for [facility].  Evaluation Performed:  Preoperative cardiovascular risk assessment _____________   Date:  09/22/2022   Patient ID:  Todd, [name] Sep 16, 1945, MRN [id number] Patient Location:  Home Provider location:   Office  Primary Care Provider:  Koren Shiver, DO Primary Cardiologist:  Lesleigh Noe, MD (Inactive)  Chief Complaint / Patient Profile   77 y.o. 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  History of Present Illness    Todd [name] is a 77 y.o. male who presents via audio/video conferencing for a telehealth visit today.  Pt was last seen in cardiology clinic on 05/13/22 by Dr. Katrinka Blazing.  At that time DELEON SEMRAD was doing well.  The patient is now pending procedure as outlined above. Since his last visit, he states that he is not having any chest or SOB. He is not having any cardiac related issues today.  He works out every day for about 30 minutes, 7 days a week.  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 Katrinka Blazing back in December.  He is scored 6.36 METS on the DASI.  This exceeds 4 METS minimum requirement.   His  aspirin is not prescribed by cardiology. From our perspective it would be okay to hold ASA if needed x 7 days prior to the procedure and resume when medically safe to do so.  Past Medical History    Past Medical History:  Diagnosis Date   Coronary artery disease    Status post CABG   Diabetes mellitus    Hyperlipidemia    ICD-St.Jude-CRT    Implanted 2010   Other primary cardiomyopathies    Syncope, vasovagal    Past Surgical History:  Procedure Laterality Date   COLONOSCOPY WITH PROPOFOL N/A 08/30/2021   Procedure: COLONOSCOPY WITH PROPOFOL;  Surgeon: Jeani Hawking, MD;  Location: [facility];  Service: Gastroenterology;  Laterality: N/A;   CORONARY STENT PLACEMENT     POLYPECTOMY  08/30/2021   Procedure: POLYPECTOMY;  Surgeon: Jeani Hawking, MD;  Location: [facility];  Service: Gastroenterology;;    Allergies  Allergies  Allergen Reactions   Shellfish Allergy Hives    Home Medications    Prior to Admission medications   Medication Sig Start Date End Date Taking? Authorizing Provider  acetaminophen (TYLENOL) 500 MG tablet Take 500 mg by mouth daily as needed (strenuous yard work).    [provider]  amLODipine (NORVASC) 5 MG tablet Take 5 mg by mouth every evening.    [provider]  aspirin 81 MG EC tablet 81 mg every evening. 10/16/20   [provider]  atorvastatin (LIPITOR) 40 MG tablet Take 40 mg by mouth every evening.  [provider]  carvedilol (COREG) 25 MG tablet Take 25 mg by mouth 2 (two) times daily with a meal.    [provider]  Cholecalciferol 50 MCG (2000 UT) TABS Take 2,000 Units by mouth in the morning. 04/18/20   [provider]  empagliflozin (JARDIANCE) 25 MG TABS tablet Take 25 mg by mouth in the morning.    [provider]  Esomeprazole Magnesium 20 MG TBEC Take 20 mg by mouth daily.    [provider]  folic acid (FOLVITE) 1 MG tablet Take 1 mg by mouth in the morning.     [provider]  icosapent Ethyl (VASCEPA) 1 g capsule TAKE 2 CAPSULES (2 G TOTAL) BY MOUTH 2 (TWO) TIMES DAILY. 04/03/21   Lyn Records, MD  levothyroxine (SYNTHROID, LEVOTHROID) 50 MCG tablet Take 50 mcg by mouth in the morning.    [provider]  losartan (COZAAR) 100 MG tablet Take 100 mg by mouth in the morning.    [provider]  metFORMIN (GLUCOPHAGE) 1000 MG tablet Take 1,000 mg by mouth 2 (two) times daily with a meal.    [provider]  Multiple Vitamin (MULTIVITAMIN WITH MINERALS) TABS tablet Take 1 tablet by mouth in the morning.    [provider]  OZEMPIC, 0.25 OR 0.5 MG/DOSE, 2 MG/1.5ML SOPN Inject 0.5 mg into the skin every Monday. 08/19/21   [provider]  pramipexole (MIRAPEX) 0.25 MG tablet Take 0.25 mg by mouth every evening.    [provider]    Physical Exam    Vital Signs:  DARVON UELAND does not have vital signs available for review today.  Given telephonic nature of communication, physical exam is limited. AAOx3. NAD. Normal affect.  Speech and respirations are unlabored.  Accessory Clinical Findings    None  Assessment & Plan    1.  Preoperative Cardiovascular Risk Assessment:  Mr. Feaser perioperative risk of a major cardiac event is 0.9% according to the Revised Cardiac Risk Index (RCRI).  Therefore, he is at low risk for perioperative complications.   His functional capacity is good at 6.36 METs according to the [facility] Activity Status Index (DASI). Recommendations: According to ACC/AHA guidelines, no further cardiovascular testing needed.  The patient may proceed to surgery at acceptable risk.   Antiplatelet and/or Anticoagulation Recommendations: Aspirin can be held for 5-7 days prior to his surgery.  Please resume Aspirin post operatively when it is felt to be safe from a bleeding standpoint.    A copy of this note will be routed to requesting surgeon.  Time:   Today, I have  spent 10 minutes with the patient with telehealth technology discussing medical history, symptoms, and management plan.     Sharlene Dory, PA-C  09/22/2022, 7:54 AM

## 2022-09-23 ENCOUNTER — Other Ambulatory Visit: Payer: Self-pay | Admitting: Gastroenterology

## 2022-10-01 DIAGNOSIS — E119 Type 2 diabetes mellitus without complications: Secondary | ICD-10-CM | POA: Diagnosis not present

## 2022-10-01 DIAGNOSIS — Z136 Encounter for screening for cardiovascular disorders: Secondary | ICD-10-CM | POA: Diagnosis not present

## 2022-10-01 DIAGNOSIS — Z0000 Encounter for general adult medical examination without abnormal findings: Secondary | ICD-10-CM | POA: Diagnosis not present

## 2022-10-10 ENCOUNTER — Encounter (HOSPITAL_COMMUNITY): Payer: Self-pay | Admitting: Gastroenterology

## 2022-10-16 DIAGNOSIS — E119 Type 2 diabetes mellitus without complications: Secondary | ICD-10-CM | POA: Diagnosis not present

## 2022-10-16 DIAGNOSIS — H25813 Combined forms of age-related cataract, bilateral: Secondary | ICD-10-CM | POA: Diagnosis not present

## 2022-10-16 DIAGNOSIS — H43813 Vitreous degeneration, bilateral: Secondary | ICD-10-CM | POA: Diagnosis not present

## 2022-10-16 DIAGNOSIS — H526 Other disorders of refraction: Secondary | ICD-10-CM | POA: Diagnosis not present

## 2022-10-17 ENCOUNTER — Other Ambulatory Visit: Payer: Self-pay

## 2022-10-17 ENCOUNTER — Encounter (HOSPITAL_COMMUNITY)
Admission: RE | Disposition: A | Discharge status: Home / Self Care | Payer: Self-pay | Source: Home / Self Care | Attending: Gastroenterology

## 2022-10-17 ENCOUNTER — Ambulatory Visit (HOSPITAL_COMMUNITY): Payer: Medicare Other | Admitting: Certified Registered"

## 2022-10-17 ENCOUNTER — Encounter (HOSPITAL_COMMUNITY): Payer: Self-pay | Admitting: Gastroenterology

## 2022-10-17 ENCOUNTER — Ambulatory Visit (HOSPITAL_COMMUNITY)
Admission: RE | Admit: 2022-10-17 | Discharge: 2022-10-17 | Disposition: A | Discharge status: Home / Self Care | Payer: Medicare Other | Attending: Gastroenterology | Admitting: Gastroenterology

## 2022-10-17 ENCOUNTER — Ambulatory Visit (HOSPITAL_BASED_OUTPATIENT_CLINIC_OR_DEPARTMENT_OTHER): Payer: Medicare Other | Admitting: Certified Registered"

## 2022-10-17 DIAGNOSIS — I110 Hypertensive heart disease with heart failure: Secondary | ICD-10-CM | POA: Insufficient documentation

## 2022-10-17 DIAGNOSIS — K219 Gastro-esophageal reflux disease without esophagitis: Secondary | ICD-10-CM | POA: Insufficient documentation

## 2022-10-17 DIAGNOSIS — I340 Nonrheumatic mitral (valve) insufficiency: Secondary | ICD-10-CM | POA: Diagnosis not present

## 2022-10-17 DIAGNOSIS — I2510 Atherosclerotic heart disease of native coronary artery without angina pectoris: Secondary | ICD-10-CM | POA: Insufficient documentation

## 2022-10-17 DIAGNOSIS — Z1211 Encounter for screening for malignant neoplasm of colon: Secondary | ICD-10-CM | POA: Diagnosis not present

## 2022-10-17 DIAGNOSIS — D123 Benign neoplasm of transverse colon: Secondary | ICD-10-CM | POA: Insufficient documentation

## 2022-10-17 DIAGNOSIS — E119 Type 2 diabetes mellitus without complications: Secondary | ICD-10-CM

## 2022-10-17 DIAGNOSIS — Z955 Presence of coronary angioplasty implant and graft: Secondary | ICD-10-CM | POA: Diagnosis not present

## 2022-10-17 DIAGNOSIS — Z7984 Long term (current) use of oral hypoglycemic drugs: Secondary | ICD-10-CM | POA: Insufficient documentation

## 2022-10-17 DIAGNOSIS — Z951 Presence of aortocoronary bypass graft: Secondary | ICD-10-CM | POA: Diagnosis not present

## 2022-10-17 DIAGNOSIS — K635 Polyp of colon: Secondary | ICD-10-CM | POA: Diagnosis not present

## 2022-10-17 DIAGNOSIS — Z86010 Personal history of colonic polyps: Secondary | ICD-10-CM | POA: Insufficient documentation

## 2022-10-17 DIAGNOSIS — I509 Heart failure, unspecified: Secondary | ICD-10-CM | POA: Diagnosis not present

## 2022-10-17 DIAGNOSIS — Z139 Encounter for screening, unspecified: Secondary | ICD-10-CM | POA: Diagnosis not present

## 2022-10-17 DIAGNOSIS — Z87891 Personal history of nicotine dependence: Secondary | ICD-10-CM | POA: Insufficient documentation

## 2022-10-17 DIAGNOSIS — K6389 Other specified diseases of intestine: Secondary | ICD-10-CM | POA: Diagnosis not present

## 2022-10-17 DIAGNOSIS — D126 Benign neoplasm of colon, unspecified: Secondary | ICD-10-CM

## 2022-10-17 DIAGNOSIS — K5730 Diverticulosis of large intestine without perforation or abscess without bleeding: Secondary | ICD-10-CM | POA: Diagnosis not present

## 2022-10-17 HISTORY — PX: COLONOSCOPY WITH PROPOFOL: SHX5780

## 2022-10-17 HISTORY — PX: POLYPECTOMY: SHX5525

## 2022-10-17 LAB — GLUCOSE, CAPILLARY
Glucose-Capillary: 188 mg/dL — ABNORMAL HIGH (ref 70–99)
Glucose-Capillary: 168 mg/dL — ABNORMAL HIGH (ref 70–99)

## 2022-10-17 SURGERY — COLONOSCOPY WITH PROPOFOL
Anesthesia: Monitor Anesthesia Care

## 2022-10-17 MED ORDER — PHENYLEPHRINE 80 MCG/ML (10ML) SYRINGE FOR IV PUSH (FOR BLOOD PRESSURE SUPPORT)
PREFILLED_SYRINGE | INTRAVENOUS | Status: DC | PRN
  Administered 2022-10-17 (×3): 80 ug via INTRAVENOUS

## 2022-10-17 MED ORDER — PROPOFOL 1000 MG/100ML IV EMUL
INTRAVENOUS | Status: AC
  Filled 2022-10-17: qty 100

## 2022-10-17 MED ORDER — LACTATED RINGERS IV SOLN: INTRAVENOUS | Status: DC

## 2022-10-17 MED ORDER — LIDOCAINE 2% (20 MG/ML) 5 ML SYRINGE
INTRAMUSCULAR | Status: DC | PRN
  Administered 2022-10-17: 80 mg via INTRAVENOUS

## 2022-10-17 MED ORDER — PROPOFOL 500 MG/50ML IV EMUL
INTRAVENOUS | Status: DC | PRN
  Administered 2022-10-17: 125 ug/kg/min via INTRAVENOUS

## 2022-10-17 MED ORDER — PROPOFOL 10 MG/ML IV BOLUS
INTRAVENOUS | Status: DC | PRN
  Administered 2022-10-17: 20 mg via INTRAVENOUS

## 2022-10-17 MED ORDER — SODIUM CHLORIDE 0.9 % IV SOLN: INTRAVENOUS | Status: DC

## 2022-10-17 SURGICAL SUPPLY — 22 items

## 2022-10-17 NOTE — Anesthesia Postprocedure Evaluation (Signed)
Anesthesia Post Note  Patient: Todd [name]  Procedure(s) Performed: COLONOSCOPY WITH PROPOFOL POLYPECTOMY     Patient location during evaluation: PACU Anesthesia Type: MAC Level of consciousness: awake and alert Pain management: pain level controlled Vital Signs Assessment: post-procedure vital signs reviewed and stable Respiratory status: spontaneous breathing, nonlabored ventilation, respiratory function stable and patient connected to nasal cannula oxygen Cardiovascular status: stable and blood pressure returned to baseline Postop Assessment: no apparent nausea or vomiting Anesthetic complications: no  No notable events documented.  Last Vitals:  Vitals:   10/17/22 0911 10/17/22 0920  BP:  136/70  Pulse: 62 61  Resp: 16 15  Temp:    SpO2: 97% 98%    Last Pain:  Vitals:   10/17/22 0920  TempSrc:   PainSc: 0-No pain                 Shelton Silvas

## 2022-10-17 NOTE — Op Note (Signed)
[facility] Patient Name: Todd [name] Procedure Date: 10/17/2022 MRN: [id number] Attending MD: Jeani Hawking , MD, [id number] Date of Birth: 31-Jul-1945 CSN: [id number] Age: 76 Admit Type: Outpatient Procedure:                Colonoscopy Indications:              High risk colon cancer surveillance: Personal                            history of colonic polyps Providers:                Jeani Hawking, MD, Pollie Friar RN, RN, Irene Shipper, Technician, Evelina Dun CRNA Referring MD:              Medicines:                Propofol per Anesthesia Complications:            No immediate complications. Estimated Blood Loss:     Estimated blood loss: none. Procedure:                Pre-Anesthesia Assessment:                           - Prior to the procedure, a History and Physical                            was performed, and patient medications and                            allergies were reviewed. The patient's tolerance of                            previous anesthesia was also reviewed. The risks                            and benefits of the procedure and the sedation                            options and risks were discussed with the patient.                            All questions were answered, and informed consent                            was obtained. Prior Anticoagulants: The patient has                            taken no anticoagulant or antiplatelet agents. ASA                            Grade Assessment: II - A patient with mild systemic  disease. After reviewing the risks and benefits,                            the patient was deemed in satisfactory condition to                            undergo the procedure.                           - Sedation was administered by an anesthesia                            professional. Deep sedation was attained.                           After obtaining informed  consent, the colonoscope                            was passed under direct vision. Throughout the                            procedure, the patient's blood pressure, pulse, and                            oxygen saturations were monitored continuously. The                            CF-HQ190L ([id number]) Olympus colonoscope was                            introduced through the anus and advanced to the the                            cecum, identified by appendiceal orifice and                            ileocecal valve. The colonoscopy was performed                            without difficulty. The patient tolerated the                            procedure well. The quality of the bowel                            preparation was evaluated using the BBPS [facility]                            Bowel Preparation Scale) with scores of: Right                            Colon = 3 (entire mucosa seen well with no residual  staining, small fragments of stool or opaque                            liquid), Transverse Colon = 2 (minor amount of                            residual staining, small fragments of stool and/or                            opaque liquid, but mucosa seen well) and Left Colon                            = 2 (minor amount of residual staining, small                            fragments of stool and/or opaque liquid, but mucosa                            seen well). The total BBPS score equals 7. The                            quality of the bowel preparation was good. The                            ileocecal valve, appendiceal orifice, and rectum                            were photographed. Scope In: 8:22:31 AM Scope Out: 8:47:13 AM Scope Withdrawal Time: 0 hours 20 minutes 3 seconds  Total Procedure Duration: 0 hours 24 minutes 42 seconds  Findings:      Two sessile polyps were found in the transverse colon. The polyps were 2       to 3 mm in size. These  polyps were removed with a cold snare. Resection       and retrieval were complete.      Scattered large-mouthed diverticula were found in the sigmoid colon,       descending colon and ascending colon. Impression:               - Two 2 to 3 mm polyps in the transverse colon,                            removed with a cold snare. Resected and retrieved.                           - Diverticulosis in the sigmoid colon, in the                            descending colon and in the ascending colon. Moderate Sedation:      Not Applicable - Patient had care per Anesthesia. Recommendation:           - Patient has a contact number available for  emergencies. The signs and symptoms of potential                            delayed complications were discussed with the                            patient. Return to normal activities tomorrow.                            Written discharge instructions were provided to the                            patient.                           - Resume previous diet.                           - Continue present medications.                           - Await pathology results.                           - Repeat colonoscopy in 3 years for surveillance. Procedure Code(s):        --- Professional ---                           [phone], Colonoscopy, flexible; with removal of                            tumor(s), polyp(s), or other lesion(s) by snare                            technique Diagnosis Code(s):        --- Professional ---                           D12.3, Benign neoplasm of transverse colon (hepatic                            flexure or splenic flexure)                           Z86.010, Personal history of colonic polyps                           K57.30, Diverticulosis of large intestine without                            perforation or abscess without bleeding CPT copyright 2022 American Medical Association. All rights reserved. The  codes documented in this report are preliminary and upon coder review may  be revised to meet current compliance requirements. Jeani Hawking, MD Jeani Hawking, MD 10/17/2022 9:09:11 AM This report has been signed electronically. Number of Addenda: 0

## 2022-10-17 NOTE — Anesthesia Procedure Notes (Signed)
Procedure Name: MAC Date/Time: 10/17/2022 8:15 AM  Performed by: Sindy Guadeloupe, CRNAPre-anesthesia Checklist: Patient identified, Emergency Drugs available, Suction available, Patient being monitored and Timeout performed Oxygen Delivery Method: Simple face mask Placement Confirmation: positive ETCO2

## 2022-10-17 NOTE — Transfer of Care (Signed)
Immediate Anesthesia Transfer of Care Note  Patient: Todd [name]  Procedure(s) Performed: COLONOSCOPY WITH PROPOFOL POLYPECTOMY  Patient Location: PACU  Anesthesia Type:MAC  Level of Consciousness: awake, drowsy, and patient cooperative  Airway & Oxygen Therapy: Patient Spontanous Breathing and Patient connected to face mask oxygen  Post-op Assessment: Report given to RN and Post -op Vital signs reviewed and stable  Post vital signs: Reviewed and stable  Last Vitals:  Vitals Value Taken Time  BP 107/59   Temp    Pulse 60 10/17/22 0855  Resp 14 10/17/22 0855  SpO2 100 % 10/17/22 0855  Vitals shown include unvalidated device data.  Last Pain:  Vitals:   10/17/22 0752  TempSrc: Tympanic  PainSc: 0-No pain         Complications: No notable events documented.

## 2022-10-17 NOTE — Discharge Instructions (Signed)

## 2022-10-17 NOTE — Anesthesia Preprocedure Evaluation (Addendum)
Anesthesia Evaluation  Patient identified by MRN, date of birth, ID band Patient awake    Reviewed: Allergy & Precautions, NPO status , Patient's Chart, lab work & pertinent test results  Airway Mallampati: II  TM Distance: >3 FB Neck ROM: Full    Dental  (+) Teeth Intact, Dental Advisory Given   Pulmonary former smoker   breath sounds clear to auscultation       Cardiovascular hypertension, Pt. on medications and Pt. on home beta blockers + CAD, + Cardiac Stents, + CABG and +CHF  + Cardiac Defibrillator  Rhythm:Regular Rate:Normal  Echo:  1. Small linear density seen on the pacemaker lead in the RA. Suspect  this represents thrombus which commonly is seen on these leads. If there  are clinical concerns for endocarditis would check blood cultures and  obtain a TEE. Best seen on image 58.   2. Left ventricular ejection fraction, by estimation, is 50 to 55%. The  left ventricle has low normal function. The left ventricle has no regional  wall motion abnormalities. Left ventricular diastolic parameters are  consistent with Grade I diastolic  dysfunction (impaired relaxation).   3. Right ventricular systolic function is normal. The right ventricular  size is mildly enlarged. There is normal pulmonary artery systolic  pressure. The estimated right ventricular systolic pressure is 33.5 mmHg.   4. The mitral valve is grossly normal. Mild mitral valve regurgitation.  No evidence of mitral stenosis.   5. The aortic valve is tricuspid. Aortic valve regurgitation is not  visualized. No aortic stenosis is present.   6. The inferior vena cava is normal in size with greater than 50%  respiratory variability, suggesting right atrial pressure of 3 mmHg.     Neuro/Psych negative neurological ROS  negative psych ROS   GI/Hepatic Neg liver ROS,GERD  Medicated,,  Endo/Other  diabetes, Type 2, Oral Hypoglycemic Agents    Renal/GU negative  Renal ROS     Musculoskeletal negative musculoskeletal ROS (+)    Abdominal   Peds  Hematology negative hematology ROS (+)   Anesthesia Other Findings   Reproductive/Obstetrics                             Anesthesia Physical Anesthesia Plan  ASA: 3  Anesthesia Plan: MAC   Post-op Pain Management: Minimal or no pain anticipated   Induction: Intravenous  PONV Risk Score and Plan: 0 and Propofol infusion  Airway Management Planned: Natural Airway and Simple Face Mask  Additional Equipment: None  Intra-op Plan:   Post-operative Plan:   Informed Consent: I have reviewed the patients History and Physical, chart, labs and discussed the procedure including the risks, benefits and alternatives for the proposed anesthesia with the patient or authorized representative who has indicated his/her understanding and acceptance.       Plan Discussed with: CRNA  Anesthesia Plan Comments:        Anesthesia Quick Evaluation

## 2022-10-17 NOTE — H&P (Signed)
Todd [name] HPI: His colonoscopy last year on 08/31/2022 was positive for 10 small adenomas.  He denies any issues with chest pain, SOB, or MI.  Ozempic was started over the past year and he complained about a chest pain problem.  Extensive work up was negative for any abnormalities from a cardiac standpoint.  Eating small frequent meals resolved his chest pain complaint  Past Medical History:  Diagnosis Date   Coronary artery disease    Status post CABG   Diabetes mellitus    Hyperlipidemia    ICD-St.Jude-CRT    Implanted 2010   Other primary cardiomyopathies    Syncope, vasovagal     Past Surgical History:  Procedure Laterality Date   COLONOSCOPY WITH PROPOFOL N/A 08/30/2021   Procedure: COLONOSCOPY WITH PROPOFOL;  Surgeon: Jeani Hawking, MD;  Location: [facility];  Service: Gastroenterology;  Laterality: N/A;   CORONARY ARTERY BYPASS GRAFT     CORONARY STENT PLACEMENT     POLYPECTOMY  08/30/2021   Procedure: POLYPECTOMY;  Surgeon: Jeani Hawking, MD;  Location: [facility];  Service: Gastroenterology;;    Family History  Problem Relation Age of Onset   Diabetes Sister    Hyperlipidemia Sister    Heart disease Brother    Hypertension Brother    Hyperlipidemia Brother    Heart disease Brother    Diabetes Brother    Hypertension Brother    Hyperlipidemia Brother    Diabetes Brother    Hypertension Brother    Hyperlipidemia Brother     Social History:  reports that he quit smoking about 55 years ago. His smoking use included cigarettes. He has never used smokeless tobacco. He reports that he does not currently use alcohol. He reports that he does not use drugs.  Allergies:  Allergies  Allergen Reactions   Shellfish Allergy Hives    Medications: Scheduled: Continuous:  sodium chloride      No results found for this or any previous visit (from the past 24 hour(s)).   No results found.  ROS:  As stated above in the HPI otherwise negative.  There were no  vitals taken for this visit.    PE: Gen: NAD, Alert and Oriented HEENT:  [place]/AT, EOMI Neck: Supple, no LAD Lungs: CTA Bilaterally CV: RRR without M/G/R ABD: Soft, NTND, +BS Ext: No C/C/E  Assessment/Plan: 1) Personal history of polyps - colonoscopy.  Todd [name] D 10/17/2022, 7:36 AM

## 2022-10-20 ENCOUNTER — Encounter (HOSPITAL_COMMUNITY): Payer: Self-pay | Admitting: Gastroenterology

## 2022-10-20 LAB — SURGICAL PATHOLOGY

## 2022-11-12 ENCOUNTER — Ambulatory Visit (INDEPENDENT_AMBULATORY_CARE_PROVIDER_SITE_OTHER): Payer: Medicare Other

## 2022-11-12 DIAGNOSIS — I428 Other cardiomyopathies: Secondary | ICD-10-CM | POA: Diagnosis not present

## 2022-11-13 LAB — CUP PACEART REMOTE DEVICE CHECK
Battery Remaining Longevity: 20 mo
Battery Remaining Percentage: 25 %
Battery Voltage: 2.81 V
Brady Statistic AP VP Percent: 1.5 %
Brady Statistic AP VS Percent: 1 %
Brady Statistic AS VP Percent: 98 %
Brady Statistic AS VS Percent: 1 %
Brady Statistic RA Percent Paced: 1.3 %
Date Time Interrogation Session: 20240606001137
HighPow Impedance: 48 Ohm
HighPow Impedance: 49 Ohm
Implantable Lead Connection Status: 753985
Implantable Lead Connection Status: 753985
Implantable Lead Connection Status: 753985
Implantable Lead Implant Date: 20101005
Implantable Lead Implant Date: 20101005
Implantable Lead Implant Date: 20101005
Implantable Lead Location: 753858
Implantable Lead Location: 753859
Implantable Lead Location: 753860
Implantable Lead Model: 1944
Implantable Pulse Generator Implant Date: 20171127
Lead Channel Impedance Value: 450 Ohm
Lead Channel Impedance Value: 460 Ohm
Lead Channel Impedance Value: 900 Ohm
Lead Channel Pacing Threshold Amplitude: 0.25 V
Lead Channel Pacing Threshold Amplitude: 0.75 V
Lead Channel Pacing Threshold Amplitude: 2 V
Lead Channel Pacing Threshold Pulse Width: 0.5 ms
Lead Channel Pacing Threshold Pulse Width: 0.5 ms
Lead Channel Pacing Threshold Pulse Width: 1 ms
Lead Channel Sensing Intrinsic Amplitude: 11.9 mV
Lead Channel Sensing Intrinsic Amplitude: 2.4 mV
Lead Channel Setting Pacing Amplitude: 2 V
Lead Channel Setting Pacing Amplitude: 2 V
Lead Channel Setting Pacing Amplitude: 2.5 V
Lead Channel Setting Pacing Pulse Width: 0.5 ms
Lead Channel Setting Pacing Pulse Width: 1 ms
Lead Channel Setting Sensing Sensitivity: 0.5 mV
Pulse Gen Serial Number: 7371595

## 2022-11-20 DIAGNOSIS — J209 Acute bronchitis, unspecified: Secondary | ICD-10-CM | POA: Diagnosis not present

## 2022-11-30 ENCOUNTER — Encounter: Payer: Self-pay | Admitting: Cardiovascular Disease

## 2022-11-30 NOTE — Progress Notes (Unsigned)
No chief complaint on file.  History of Present Illness: 77 yo male with history of CAD s/p 1V CABG in 2007, HTN hyperlipidemia, DM, non-ischemic cardiomyopathy with ICD who is here today for cardiac follow up. He has been followed by Dr. Katrinka Blazing. He has CAD and had 1V CABG in 2007 with LIMA to LAD. He has a non-ischemic cardiomyopathy, BiV-ICD in place. Echo September 2023 with LVEF=50-55%. Mild MR. Stress myoview September 2023 with no large areas of ischemia.   He is here today for follow up. The patient denies any chest pain, dyspnea, palpitations, lower extremity edema, orthopnea, PND, dizziness, near syncope or syncope.   Primary Care Physician: Koren Shiver, DO   Past Medical History:  Diagnosis Date   Coronary artery disease    Status post CABG   Diabetes mellitus    HTN (hypertension)    Hyperlipidemia    ICD-St.Jude-CRT    Implanted 2010   Other primary cardiomyopathies    Syncope, vasovagal     Past Surgical History:  Procedure Laterality Date   COLONOSCOPY WITH PROPOFOL N/A 08/30/2021   Procedure: COLONOSCOPY WITH PROPOFOL;  Surgeon: Jeani Hawking, MD;  Location: [facility];  Service: Gastroenterology;  Laterality: N/A;   COLONOSCOPY WITH PROPOFOL N/A 10/17/2022   Procedure: COLONOSCOPY WITH PROPOFOL;  Surgeon: Jeani Hawking, MD;  Location: [facility];  Service: Gastroenterology;  Laterality: N/A;   CORONARY ARTERY BYPASS GRAFT     CORONARY STENT PLACEMENT     KNEE ARTHROPLASTY Left    POLYPECTOMY  08/30/2021   Procedure: POLYPECTOMY;  Surgeon: Jeani Hawking, MD;  Location: [facility];  Service: Gastroenterology;;   POLYPECTOMY  10/17/2022   Procedure: POLYPECTOMY;  Surgeon: Jeani Hawking, MD;  Location: [facility];  Service: Gastroenterology;;    Current Outpatient Medications  Medication Sig Dispense Refill   acetaminophen (TYLENOL) 650 MG CR tablet Take 1,300 mg by mouth every 8 (eight) hours as needed for pain.     amLODipine (NORVASC) 5 MG  tablet Take 5 mg by mouth every evening.     aspirin 81 MG EC tablet Take 81 mg by mouth every evening.     atorvastatin (LIPITOR) 40 MG tablet Take 40 mg by mouth every evening.     carvedilol (COREG) 25 MG tablet Take 25 mg by mouth 2 (two) times daily with a meal.     Cholecalciferol 50 MCG (2000 UT) TABS Take 2,000 Units by mouth in the morning.     diphenhydrAMINE (BENADRYL) 25 MG tablet Take 25 mg by mouth every 6 (six) hours as needed for allergies.     empagliflozin (JARDIANCE) 25 MG TABS tablet Take 25 mg by mouth in the morning.     Esomeprazole Magnesium 20 MG TBEC Take 20 mg by mouth every other day.     folic acid (FOLVITE) 1 MG tablet Take 1 mg by mouth in the morning.     icosapent Ethyl (VASCEPA) 1 g capsule TAKE 2 CAPSULES (2 G TOTAL) BY MOUTH 2 (TWO) TIMES DAILY. 360 capsule 3   levothyroxine (SYNTHROID, LEVOTHROID) 50 MCG tablet Take 50 mcg by mouth in the morning.     losartan (COZAAR) 100 MG tablet Take 100 mg by mouth in the morning.     metFORMIN (GLUCOPHAGE) 1000 MG tablet Take 1,000 mg by mouth 2 (two) times daily with a meal.     Multiple Vitamin (MULTIVITAMIN WITH MINERALS) TABS tablet Take 1 tablet by mouth in the morning.     OZEMPIC, 0.25  OR 0.5 MG/DOSE, 2 MG/1.5ML SOPN Inject 0.5 mg into the skin every Thursday.     pramipexole (MIRAPEX) 0.25 MG tablet Take 0.25 mg by mouth every evening.     No current facility-administered medications for this visit.    Allergies  Allergen Reactions   Shellfish Allergy Hives    Social History   Socioeconomic History   Marital status: Married    Spouse name: Not on file   Number of children: Not on file   Years of education: Not on file   Highest education level: Not on file  Occupational History   Not on file  Tobacco Use   Smoking status: Former    Types: Cigarettes    Quit date: 1969    Years since quitting: 55.5   Smokeless tobacco: Never  Vaping Use   Vaping Use: Never used  Substance and Sexual Activity    Alcohol use: Not Currently   Drug use: Never   Sexual activity: Not on file  Other Topics Concern   Not on file  Social History Narrative   Not on file   Social Determinants of Health   Financial Resource Strain: Not on file  Food Insecurity: Not on file  Transportation Needs: Not on file  Physical Activity: Not on file  Stress: Not on file  Social Connections: Not on file  Intimate Partner Violence: Not on file    Family History  Problem Relation Age of Onset   Diabetes Sister    Hyperlipidemia Sister    Heart disease Brother    Hypertension Brother    Hyperlipidemia Brother    Heart disease Brother    Diabetes Brother    Hypertension Brother    Hyperlipidemia Brother    Diabetes Brother    Hypertension Brother    Hyperlipidemia Brother     Review of Systems:  As stated in the HPI and otherwise negative.   There were no vitals taken for this visit.  Physical Examination: General: Well developed, well nourished, NAD  HEENT: OP clear, mucus membranes moist  SKIN: warm, dry. No rashes. Neuro: No focal deficits  Musculoskeletal: Muscle strength 5/5 all ext  Psychiatric: Mood and affect normal  Neck: No JVD, no carotid bruits, no thyromegaly, no lymphadenopathy.  Lungs:Clear bilaterally, no wheezes, rhonci, crackles Cardiovascular: Regular rate and rhythm. No murmurs, gallops or rubs. Abdomen:Soft. Bowel sounds present. Non-tender.  Extremities: No lower extremity edema. Pulses are 2 + in the bilateral DP/PT.  EKG:  EKG {ACTION; IS/IS [id number]} ordered today. The ekg ordered today demonstrates ***  Echo September 2023: 1. Small linear density seen on the pacemaker lead in the RA. Suspect  this represents thrombus which commonly is seen on these leads. If there  are clinical concerns for endocarditis would check blood cultures and  obtain a TEE. Best seen on image 58.   2. Left ventricular ejection fraction, by estimation, is 50 to 55%. The  left  ventricle has low normal function. The left ventricle has no regional  wall motion abnormalities. Left ventricular diastolic parameters are  consistent with Grade I diastolic  dysfunction (impaired relaxation).   3. Right ventricular systolic function is normal. The right ventricular  size is mildly enlarged. There is normal pulmonary artery systolic  pressure. The estimated right ventricular systolic pressure is 33.5 mmHg.   4. The mitral valve is grossly normal. Mild mitral valve regurgitation.  No evidence of mitral stenosis.   5. The aortic valve is tricuspid. Aortic valve regurgitation is  not  visualized. No aortic stenosis is present.   6. The inferior vena cava is normal in size with greater than 50%  respiratory variability, suggesting right atrial pressure of 3 mmHg.   Recent Labs: 02/12/2022: BUN 16; Creatinine, Ser 0.92; Hemoglobin 14.6; Platelets 181; Potassium 4.4; Sodium 141; TSH 2.280   Lipid Panel    Component Value Date/Time   CHOL 132 04/18/2019 0757   TRIG 244 (H) 04/18/2019 0757   HDL 35 (L) 04/18/2019 0757   CHOLHDL 3.8 04/18/2019 0757   LDLCALC 58 04/18/2019 0757     Wt Readings from Last 3 Encounters:  10/17/22 79.4 kg  05/13/22 80.6 kg  03/19/22 82.6 kg    Assessment and Plan:   1. CAD s/p CABG without angina: No chest pain suggestive of angina. Continue ASA, statin and beta blocker.   2. Non-ischemic cardiomyopathy/Chronic diastolic CHF: No volume overload on exam. Continue Coreg, Losartan and Jardiance. ICD in place and followed by Dr. Graciela Husbands.   3. HTN: BP is well controlled. Continue Norvasc, Coreg and Losartan  4. Hyperlipidemia: Lipids followed in primary care. LDL ***. Continue statin  Labs/ tests ordered today include:  No orders of the defined types were placed in this encounter.  Disposition:   F/U with me in ***   Signed, Verne Carrow, MD, [facility] 11/30/2022 9:32 AM    [facility] [address] [place],  [place], [place]  [address] Phone: [phone]; Fax: [phone]

## 2022-12-01 ENCOUNTER — Ambulatory Visit (INDEPENDENT_AMBULATORY_CARE_PROVIDER_SITE_OTHER): Payer: Medicare Other | Admitting: Cardiovascular Disease

## 2022-12-01 ENCOUNTER — Observation Stay (HOSPITAL_BASED_OUTPATIENT_CLINIC_OR_DEPARTMENT_OTHER)
Admission: EM | Admit: 2022-12-01 | Discharge: 2022-12-03 | Disposition: A | Discharge status: Home / Self Care | Payer: Medicare Other | Attending: Internal Medicine | Admitting: Internal Medicine

## 2022-12-01 ENCOUNTER — Encounter (HOSPITAL_BASED_OUTPATIENT_CLINIC_OR_DEPARTMENT_OTHER): Payer: Self-pay

## 2022-12-01 ENCOUNTER — Encounter: Payer: Self-pay | Admitting: Cardiovascular Disease

## 2022-12-01 VITALS — BP 128/60 | HR 76 | Ht 71.0 in | Wt 180.2 lb

## 2022-12-01 DIAGNOSIS — J9601 Acute respiratory failure with hypoxia: Secondary | ICD-10-CM | POA: Insufficient documentation

## 2022-12-01 DIAGNOSIS — E872 Acidosis, unspecified: Secondary | ICD-10-CM | POA: Diagnosis not present

## 2022-12-01 DIAGNOSIS — Z951 Presence of aortocoronary bypass graft: Secondary | ICD-10-CM | POA: Insufficient documentation

## 2022-12-01 DIAGNOSIS — E1159 Type 2 diabetes mellitus with other circulatory complications: Secondary | ICD-10-CM

## 2022-12-01 DIAGNOSIS — E039 Hypothyroidism, unspecified: Secondary | ICD-10-CM | POA: Diagnosis not present

## 2022-12-01 DIAGNOSIS — D72829 Elevated white blood cell count, unspecified: Secondary | ICD-10-CM | POA: Insufficient documentation

## 2022-12-01 DIAGNOSIS — I428 Other cardiomyopathies: Secondary | ICD-10-CM | POA: Insufficient documentation

## 2022-12-01 DIAGNOSIS — I25709 Atherosclerosis of coronary artery bypass graft(s), unspecified, with unspecified angina pectoris: Secondary | ICD-10-CM | POA: Diagnosis present

## 2022-12-01 DIAGNOSIS — I255 Ischemic cardiomyopathy: Secondary | ICD-10-CM | POA: Diagnosis not present

## 2022-12-01 DIAGNOSIS — Z95810 Presence of automatic (implantable) cardiac defibrillator: Secondary | ICD-10-CM | POA: Diagnosis not present

## 2022-12-01 DIAGNOSIS — Z955 Presence of coronary angioplasty implant and graft: Secondary | ICD-10-CM | POA: Insufficient documentation

## 2022-12-01 DIAGNOSIS — I5032 Chronic diastolic (congestive) heart failure: Secondary | ICD-10-CM | POA: Diagnosis not present

## 2022-12-01 DIAGNOSIS — I25118 Atherosclerotic heart disease of native coronary artery with other forms of angina pectoris: Secondary | ICD-10-CM | POA: Insufficient documentation

## 2022-12-01 DIAGNOSIS — Z7984 Long term (current) use of oral hypoglycemic drugs: Secondary | ICD-10-CM | POA: Diagnosis not present

## 2022-12-01 DIAGNOSIS — E7849 Other hyperlipidemia: Secondary | ICD-10-CM | POA: Insufficient documentation

## 2022-12-01 DIAGNOSIS — I10 Essential (primary) hypertension: Secondary | ICD-10-CM | POA: Diagnosis present

## 2022-12-01 DIAGNOSIS — E119 Type 2 diabetes mellitus without complications: Secondary | ICD-10-CM | POA: Diagnosis not present

## 2022-12-01 DIAGNOSIS — R7401 Elevation of levels of liver transaminase levels: Secondary | ICD-10-CM | POA: Insufficient documentation

## 2022-12-01 DIAGNOSIS — Z79899 Other long term (current) drug therapy: Secondary | ICD-10-CM | POA: Diagnosis not present

## 2022-12-01 DIAGNOSIS — I509 Heart failure, unspecified: Secondary | ICD-10-CM

## 2022-12-01 DIAGNOSIS — E785 Hyperlipidemia, unspecified: Secondary | ICD-10-CM | POA: Diagnosis present

## 2022-12-01 DIAGNOSIS — Z87891 Personal history of nicotine dependence: Secondary | ICD-10-CM | POA: Insufficient documentation

## 2022-12-01 DIAGNOSIS — Z7982 Long term (current) use of aspirin: Secondary | ICD-10-CM | POA: Diagnosis not present

## 2022-12-01 DIAGNOSIS — T783XXA Angioneurotic edema, initial encounter: Secondary | ICD-10-CM | POA: Diagnosis not present

## 2022-12-01 DIAGNOSIS — I2510 Atherosclerotic heart disease of native coronary artery without angina pectoris: Secondary | ICD-10-CM

## 2022-12-01 DIAGNOSIS — I959 Hypotension, unspecified: Secondary | ICD-10-CM | POA: Insufficient documentation

## 2022-12-01 DIAGNOSIS — T782XXA Anaphylactic shock, unspecified, initial encounter: Secondary | ICD-10-CM | POA: Diagnosis not present

## 2022-12-01 DIAGNOSIS — R21 Rash and other nonspecific skin eruption: Secondary | ICD-10-CM | POA: Diagnosis present

## 2022-12-01 DIAGNOSIS — I110 Hypertensive heart disease with heart failure: Secondary | ICD-10-CM | POA: Diagnosis not present

## 2022-12-01 LAB — CBC WITH DIFFERENTIAL/PLATELET
Abs Immature Granulocytes: 0.07 10*3/uL (ref 0.00–0.07)
Basophils Absolute: 0 10*3/uL (ref 0.0–0.1)
Basophils Relative: 0 %
Eosinophils Absolute: 0.1 10*3/uL (ref 0.0–0.5)
Eosinophils Relative: 0 %
HCT: 48 % (ref 39.0–52.0)
Hemoglobin: 16.6 g/dL (ref 13.0–17.0)
Immature Granulocytes: 0 %
Lymphocytes Relative: 9 %
Lymphs Abs: 1.4 10*3/uL (ref 0.7–4.0)
MCH: 29.4 pg (ref 26.0–34.0)
MCHC: 34.6 g/dL (ref 30.0–36.0)
MCV: 85 fL (ref 80.0–100.0)
Monocytes Absolute: 0.7 10*3/uL (ref 0.1–1.0)
Monocytes Relative: 4 %
Neutro Abs: 13.5 10*3/uL — ABNORMAL HIGH (ref 1.7–7.7)
Neutrophils Relative %: 87 %
Platelets: 225 10*3/uL (ref 150–400)
RBC: 5.65 MIL/uL (ref 4.22–5.81)
RDW: 13.4 % (ref 11.5–15.5)
WBC: 15.8 10*3/uL — ABNORMAL HIGH (ref 4.0–10.5)
nRBC: 0 % (ref 0.0–0.2)

## 2022-12-01 MED ORDER — EPINEPHRINE 0.3 MG/0.3ML IJ SOAJ
0.3000 mg | Freq: Once | INTRAMUSCULAR | Status: AC
  Administered 2022-12-01: 0.3 mg via INTRAMUSCULAR
  Filled 2022-12-01: qty 0.3

## 2022-12-01 MED ORDER — METHYLPREDNISOLONE SODIUM SUCC 125 MG IJ SOLR
125.0000 mg | Freq: Once | INTRAMUSCULAR | Status: AC
  Administered 2022-12-02: 125 mg via INTRAVENOUS
  Filled 2022-12-01: qty 2

## 2022-12-01 MED ORDER — DIPHENHYDRAMINE HCL 50 MG/ML IJ SOLN
25.0000 mg | Freq: Once | INTRAMUSCULAR | Status: AC
  Administered 2022-12-01: 25 mg via INTRAVENOUS
  Filled 2022-12-01: qty 1

## 2022-12-01 MED ORDER — FAMOTIDINE IN NACL 20-0.9 MG/50ML-% IV SOLN
20.0000 mg | Freq: Once | INTRAVENOUS | Status: AC
  Administered 2022-12-01: 20 mg via INTRAVENOUS
  Filled 2022-12-01: qty 50

## 2022-12-01 NOTE — Patient Instructions (Signed)
Medication Instructions:  No changes *If you need a refill on your cardiac medications before your next appointment, please call your pharmacy*   Lab Work: none If you have labs (blood work) drawn today and your tests are completely normal, you will receive your results only by: MyChart Message (if you have MyChart) OR A paper copy in the mail If you have any lab test that is abnormal or we need to change your treatment, we will call you to review the results.   Testing/Procedures: none   Follow-Up: At [facility], you and your health needs are our priority.  As part of our continuing mission to provide you with exceptional heart care, we have created designated Provider Care Teams.  These Care Teams include your primary Cardiologist (physician) and Advanced Practice Providers (APPs -  Physician Assistants and Nurse Practitioners) who all work together to provide you with the care you need, when you need it.   Your next appointment:   6 month(s)  Provider:   Christopher McAlhany, MD      

## 2022-12-01 NOTE — ED Triage Notes (Addendum)
Pt having allergic reaction unknown what has called this.   Pt's is diaphoretic, hands, lip and tongue are swollen. Pt states he is having trouble swallowing.   Vomiting in triage 2 benadryl given 2245 Rash noted to rt. Bilateral inner arms and pt states he has rash and itching between legs

## 2022-12-01 NOTE — ED Provider Notes (Signed)
[place] EMERGENCY DEPARTMENT AT [facility]  Provider Note  CSN: [id number] Arrival date & time: 12/01/22 2315  History Chief Complaint  Patient presents with   Allergic Reaction    Todd [name] is a 77 y.o. male with history of CAD, HTN, PPM, CABG 10 years ago had negative stress test in the last year reports onset of itching rash/hives and tongue/lip swelling for the last hour or so. Took some benadryl at home without improvement. No new meds, no other known exposures. Has had occasional hives in the past but never to this extent and never oral involvement. Reports some difficulty swallowing, no trouble breathing.    Home Medications Prior to Admission medications   Medication Sig Start Date End Date Taking? Authorizing Provider  acetaminophen (TYLENOL) 650 MG CR tablet Take 1,300 mg by mouth every 8 (eight) hours as needed for pain.    [provider]  amLODipine (NORVASC) 5 MG tablet Take 5 mg by mouth every evening.    [provider]  aspirin 81 MG EC tablet Take 81 mg by mouth every evening. 10/16/20   [provider]  atorvastatin (LIPITOR) 40 MG tablet Take 40 mg by mouth every evening.    [provider]  carvedilol (COREG) 25 MG tablet Take 25 mg by mouth 2 (two) times daily with a meal.    [provider]  Cholecalciferol 50 MCG (2000 UT) TABS Take 2,000 Units by mouth in the morning. 04/18/20   [provider]  diphenhydrAMINE (BENADRYL) 25 MG tablet Take 25 mg by mouth every 6 (six) hours as needed for allergies.    [provider]  empagliflozin (JARDIANCE) 25 MG TABS tablet Take 25 mg by mouth in the morning.    [provider]  Esomeprazole Magnesium 20 MG TBEC Take 20 mg by mouth every other day.    [provider]  folic acid (FOLVITE) 1 MG tablet Take 1 mg by mouth in the morning.    [provider]  icosapent Ethyl (VASCEPA) 1 g capsule TAKE 2 CAPSULES (2 G TOTAL)  BY MOUTH 2 (TWO) TIMES DAILY. 04/03/21   Lyn Records, MD  levothyroxine (SYNTHROID, LEVOTHROID) 50 MCG tablet Take 50 mcg by mouth in the morning.    [provider]  losartan (COZAAR) 100 MG tablet Take 100 mg by mouth in the morning.    [provider]  metFORMIN (GLUCOPHAGE) 1000 MG tablet Take 1,000 mg by mouth 2 (two) times daily with a meal.    [provider]  Multiple Vitamin (MULTIVITAMIN WITH MINERALS) TABS tablet Take 1 tablet by mouth in the morning.    [provider]  OZEMPIC, 0.25 OR 0.5 MG/DOSE, 2 MG/1.5ML SOPN Inject 0.5 mg into the skin every Thursday. 08/19/21   [provider]  pramipexole (MIRAPEX) 0.25 MG tablet Take 0.25 mg by mouth every evening.    [provider]     Allergies    Shellfish allergy   Review of Systems   Review of Systems Please see HPI for pertinent positives and negatives  Physical Exam Ht 5\' 11"  (1.803 m)   Wt 79.4 kg   BMI 24.41 kg/m   Physical Exam Vitals and nursing note reviewed.  Constitutional:      Appearance: Normal appearance.  HENT:     Head: Normocephalic and atraumatic.     Nose: Nose normal.     Mouth/Throat:     Mouth: Mucous membranes are moist.  Comments: Mild angioedema of tongue and lips Eyes:     Extraocular Movements: Extraocular movements intact.     Conjunctiva/sclera: Conjunctivae normal.  Cardiovascular:     Rate and Rhythm: Normal rate.  Pulmonary:     Effort: Pulmonary effort is normal.     Breath sounds: Normal breath sounds. No stridor. No wheezing.  Abdominal:     General: Abdomen is flat.     Palpations: Abdomen is soft.     Tenderness: There is no abdominal tenderness.  Musculoskeletal:        General: No swelling. Normal range of motion.     Cervical back: Neck supple.  Skin:    General: Skin is warm and dry.     Findings: Rash (hives in groin and axilla) present.  Neurological:     General: No focal deficit present.     Mental  Status: He is alert.  Psychiatric:        Mood and Affect: Mood normal.     ED Results / Procedures / Treatments   EKG None  Procedures Procedures  Medications Ordered in the ED Medications  EPINEPHrine (EPI-PEN) injection 0.3 mg (has no administration in time range)  famotidine (PEPCID) IVPB 20 mg premix (has no administration in time range)  diphenhydrAMINE (BENADRYL) injection 25 mg (has no administration in time range)    Initial Impression and Plan  Patient here with allergic reaction to unknown allergen, could be Losartan. Will treat with Epi, steroids, benadryl and pepcid. Check EKG, labs. Anticipate minium four hour monitoring period.   ED Course       MDM Rules/Calculators/A&P Medical Decision Making Amount and/or Complexity of Data Reviewed Labs: ordered.  Risk Prescription drug management.     Final Clinical Impression(s) / ED Diagnoses Final diagnoses:  None    Rx / DC Orders ED Discharge Orders     None

## 2022-12-02 ENCOUNTER — Emergency Department (HOSPITAL_BASED_OUTPATIENT_CLINIC_OR_DEPARTMENT_OTHER): Payer: Medicare Other

## 2022-12-02 ENCOUNTER — Other Ambulatory Visit: Payer: Self-pay

## 2022-12-02 DIAGNOSIS — I25709 Atherosclerosis of coronary artery bypass graft(s), unspecified, with unspecified angina pectoris: Secondary | ICD-10-CM | POA: Diagnosis not present

## 2022-12-02 DIAGNOSIS — Z951 Presence of aortocoronary bypass graft: Secondary | ICD-10-CM | POA: Diagnosis not present

## 2022-12-02 DIAGNOSIS — E039 Hypothyroidism, unspecified: Secondary | ICD-10-CM | POA: Diagnosis not present

## 2022-12-02 DIAGNOSIS — T783XXA Angioneurotic edema, initial encounter: Secondary | ICD-10-CM | POA: Diagnosis not present

## 2022-12-02 DIAGNOSIS — E119 Type 2 diabetes mellitus without complications: Secondary | ICD-10-CM | POA: Diagnosis not present

## 2022-12-02 DIAGNOSIS — R7989 Other specified abnormal findings of blood chemistry: Secondary | ICD-10-CM | POA: Diagnosis not present

## 2022-12-02 DIAGNOSIS — I509 Heart failure, unspecified: Secondary | ICD-10-CM

## 2022-12-02 DIAGNOSIS — I10 Essential (primary) hypertension: Secondary | ICD-10-CM

## 2022-12-02 DIAGNOSIS — R0902 Hypoxemia: Secondary | ICD-10-CM | POA: Diagnosis not present

## 2022-12-02 DIAGNOSIS — Z87891 Personal history of nicotine dependence: Secondary | ICD-10-CM | POA: Diagnosis not present

## 2022-12-02 DIAGNOSIS — Z95810 Presence of automatic (implantable) cardiac defibrillator: Secondary | ICD-10-CM | POA: Diagnosis not present

## 2022-12-02 DIAGNOSIS — R21 Rash and other nonspecific skin eruption: Secondary | ICD-10-CM | POA: Diagnosis present

## 2022-12-02 DIAGNOSIS — Z79899 Other long term (current) drug therapy: Secondary | ICD-10-CM | POA: Diagnosis not present

## 2022-12-02 DIAGNOSIS — I255 Ischemic cardiomyopathy: Secondary | ICD-10-CM | POA: Diagnosis not present

## 2022-12-02 DIAGNOSIS — E7849 Other hyperlipidemia: Secondary | ICD-10-CM | POA: Diagnosis not present

## 2022-12-02 DIAGNOSIS — E872 Acidosis, unspecified: Secondary | ICD-10-CM

## 2022-12-02 DIAGNOSIS — I25118 Atherosclerotic heart disease of native coronary artery with other forms of angina pectoris: Secondary | ICD-10-CM | POA: Diagnosis not present

## 2022-12-02 DIAGNOSIS — R7401 Elevation of levels of liver transaminase levels: Secondary | ICD-10-CM | POA: Diagnosis not present

## 2022-12-02 DIAGNOSIS — E1159 Type 2 diabetes mellitus with other circulatory complications: Secondary | ICD-10-CM

## 2022-12-02 DIAGNOSIS — I428 Other cardiomyopathies: Secondary | ICD-10-CM | POA: Diagnosis not present

## 2022-12-02 DIAGNOSIS — T782XXA Anaphylactic shock, unspecified, initial encounter: Secondary | ICD-10-CM | POA: Diagnosis not present

## 2022-12-02 DIAGNOSIS — J9601 Acute respiratory failure with hypoxia: Secondary | ICD-10-CM | POA: Diagnosis not present

## 2022-12-02 DIAGNOSIS — Z7982 Long term (current) use of aspirin: Secondary | ICD-10-CM | POA: Diagnosis not present

## 2022-12-02 DIAGNOSIS — Z955 Presence of coronary angioplasty implant and graft: Secondary | ICD-10-CM | POA: Diagnosis not present

## 2022-12-02 DIAGNOSIS — I959 Hypotension, unspecified: Secondary | ICD-10-CM | POA: Diagnosis not present

## 2022-12-02 DIAGNOSIS — Z7984 Long term (current) use of oral hypoglycemic drugs: Secondary | ICD-10-CM | POA: Diagnosis not present

## 2022-12-02 DIAGNOSIS — D72829 Elevated white blood cell count, unspecified: Secondary | ICD-10-CM | POA: Diagnosis not present

## 2022-12-02 DIAGNOSIS — I5032 Chronic diastolic (congestive) heart failure: Secondary | ICD-10-CM | POA: Diagnosis not present

## 2022-12-02 DIAGNOSIS — I110 Hypertensive heart disease with heart failure: Secondary | ICD-10-CM | POA: Diagnosis not present

## 2022-12-02 LAB — LACTIC ACID, PLASMA
Lactic Acid, Venous: 2.6 mmol/L (ref 0.5–1.9)
Lactic Acid, Venous: 3.4 mmol/L (ref 0.5–1.9)
Lactic Acid, Venous: 2.8 mmol/L (ref 0.5–1.9)
Lactic Acid, Venous: 3.6 mmol/L (ref 0.5–1.9)
Lactic Acid, Venous: 3 mmol/L (ref 0.5–1.9)

## 2022-12-02 LAB — COMPREHENSIVE METABOLIC PANEL
ALT: 13 U/L (ref 0–44)
ALT: 14 U/L (ref 0–44)
AST: 11 U/L — ABNORMAL LOW (ref 15–41)
AST: 16 U/L (ref 15–41)
Albumin: 3.1 g/dL — ABNORMAL LOW (ref 3.5–5.0)
Albumin: 4.4 g/dL (ref 3.5–5.0)
Alkaline Phosphatase: 29 U/L — ABNORMAL LOW (ref 38–126)
Alkaline Phosphatase: 42 U/L (ref 38–126)
Anion gap: 11 (ref 5–15)
Anion gap: 14 (ref 5–15)
BUN: 27 mg/dL — ABNORMAL HIGH (ref 8–23)
BUN: 28 mg/dL — ABNORMAL HIGH (ref 8–23)
CO2: 19 mmol/L — ABNORMAL LOW (ref 22–32)
CO2: 23 mmol/L (ref 22–32)
Calcium: 9 mg/dL (ref 8.9–10.3)
Calcium: 9.9 mg/dL (ref 8.9–10.3)
Chloride: 103 mmol/L (ref 98–111)
Chloride: 104 mmol/L (ref 98–111)
Creatinine, Ser: 1.08 mg/dL (ref 0.61–1.24)
Creatinine, Ser: 1.09 mg/dL (ref 0.61–1.24)
GFR, Estimated: >60 mL/min (ref >60)
GFR, Estimated: >60 mL/min (ref >60)
Glucose, Bld: 235 mg/dL — ABNORMAL HIGH (ref 70–99)
Glucose, Bld: 354 mg/dL — ABNORMAL HIGH (ref 70–99)
Potassium: 4.3 mmol/L (ref 3.5–5.1)
Potassium: 4.6 mmol/L (ref 3.5–5.1)
Sodium: 137 mmol/L (ref 135–145)
Sodium: 137 mmol/L (ref 135–145)
Total Bilirubin: 0.8 mg/dL (ref 0.3–1.2)
Total Bilirubin: 0.8 mg/dL (ref 0.3–1.2)
Total Protein: 5.7 g/dL — ABNORMAL LOW (ref 6.5–8.1)
Total Protein: 6.7 g/dL (ref 6.5–8.1)

## 2022-12-02 LAB — CBC
HCT: 43.3 % (ref 39.0–52.0)
Hemoglobin: 14.8 g/dL (ref 13.0–17.0)
MCH: 29.7 pg (ref 26.0–34.0)
MCHC: 34.2 g/dL (ref 30.0–36.0)
MCV: 86.8 fL (ref 80.0–100.0)
Platelets: 156 10*3/uL (ref 150–400)
RBC: 4.99 MIL/uL (ref 4.22–5.81)
RDW: 13.6 % (ref 11.5–15.5)
WBC: 13.1 10*3/uL — ABNORMAL HIGH (ref 4.0–10.5)
nRBC: 0 % (ref 0.0–0.2)

## 2022-12-02 LAB — BRAIN NATRIURETIC PEPTIDE: B Natriuretic Peptide: 17.4 pg/mL (ref 0.0–100.0)

## 2022-12-02 LAB — D-DIMER, QUANTITATIVE: D-Dimer, Quant: 2.36 ug/mL-FEU — ABNORMAL HIGH (ref 0.00–0.50)

## 2022-12-02 LAB — CBG MONITORING, ED: Glucose-Capillary: 319 mg/dL — ABNORMAL HIGH (ref 70–99)

## 2022-12-02 LAB — TROPONIN I (HIGH SENSITIVITY)
Troponin I (High Sensitivity): 3 ng/L (ref <18)
Troponin I (High Sensitivity): 3 ng/L (ref <18)

## 2022-12-02 LAB — GLUCOSE, CAPILLARY: Glucose-Capillary: 367 mg/dL — ABNORMAL HIGH (ref 70–99)

## 2022-12-02 MED ORDER — SODIUM CHLORIDE 0.9% FLUSH: 3.0000 mL | Freq: Two times a day (BID) | INTRAVENOUS | Status: DC

## 2022-12-02 MED ORDER — SODIUM CHLORIDE 0.9 % IV SOLN: INTRAVENOUS | Status: AC

## 2022-12-02 MED ORDER — CARVEDILOL 25 MG PO TABS
25.0000 mg | ORAL_TABLET | Freq: Two times a day (BID) | ORAL | Status: DC
  Administered 2022-12-03: 25 mg via ORAL
  Filled 2022-12-02: qty 1

## 2022-12-02 MED ORDER — ICOSAPENT ETHYL 1 G PO CAPS
2.0000 g | ORAL_CAPSULE | Freq: Two times a day (BID) | ORAL | Status: DC
  Administered 2022-12-02 – 2022-12-03 (×2): 2 g via ORAL
  Filled 2022-12-02 (×3): qty 2

## 2022-12-02 MED ORDER — LACTATED RINGERS IV BOLUS
1000.0000 mL | Freq: Once | INTRAVENOUS | Status: AC
  Administered 2022-12-02: 1000 mL via INTRAVENOUS

## 2022-12-02 MED ORDER — ACETAMINOPHEN 325 MG PO TABS: 650.0000 mg | ORAL_TABLET | Freq: Four times a day (QID) | ORAL | Status: DC | PRN

## 2022-12-02 MED ORDER — EPINEPHRINE 0.3 MG/0.3ML IJ SOAJ
0.3000 mg | Freq: Once | INTRAMUSCULAR | Status: AC
  Administered 2022-12-02: 0.3 mg via INTRAMUSCULAR
  Filled 2022-12-02: qty 0.3

## 2022-12-02 MED ORDER — POLYETHYLENE GLYCOL 3350 17 G PO PACK: 17.0000 g | PACK | Freq: Every day | ORAL | Status: DC | PRN

## 2022-12-02 MED ORDER — PANTOPRAZOLE SODIUM 40 MG PO TBEC
40.0000 mg | DELAYED_RELEASE_TABLET | ORAL | Status: DC
  Administered 2022-12-03: 40 mg via ORAL
  Filled 2022-12-02: qty 1

## 2022-12-02 MED ORDER — ESOMEPRAZOLE MAGNESIUM 20 MG PO TBEC: 20.0000 mg | DELAYED_RELEASE_TABLET | ORAL | Status: DC

## 2022-12-02 MED ORDER — ATORVASTATIN CALCIUM 40 MG PO TABS
40.0000 mg | ORAL_TABLET | Freq: Every evening | ORAL | Status: DC
  Administered 2022-12-02: 40 mg via ORAL
  Filled 2022-12-02: qty 1

## 2022-12-02 MED ORDER — INSULIN ASPART 100 UNIT/ML IJ SOLN: 0.0000 [IU] | Freq: Three times a day (TID) | INTRAMUSCULAR | Status: DC

## 2022-12-02 MED ORDER — ASPIRIN 81 MG PO TBEC
81.0000 mg | DELAYED_RELEASE_TABLET | Freq: Every evening | ORAL | Status: DC
  Administered 2022-12-02: 81 mg via ORAL
  Filled 2022-12-02: qty 1

## 2022-12-02 MED ORDER — PRAMIPEXOLE DIHYDROCHLORIDE 0.25 MG PO TABS
0.5000 mg | ORAL_TABLET | Freq: Every evening | ORAL | Status: DC
  Administered 2022-12-02: 0.5 mg via ORAL
  Filled 2022-12-02: qty 2

## 2022-12-02 MED ORDER — LEVOTHYROXINE SODIUM 50 MCG PO TABS
50.0000 ug | ORAL_TABLET | Freq: Every morning | ORAL | Status: DC
  Administered 2022-12-03: 50 ug via ORAL
  Filled 2022-12-02: qty 1

## 2022-12-02 MED ORDER — LACTATED RINGERS IV SOLN: INTRAVENOUS | Status: DC

## 2022-12-02 MED ORDER — ACETAMINOPHEN 650 MG RE SUPP: 650.0000 mg | Freq: Four times a day (QID) | RECTAL | Status: DC | PRN

## 2022-12-02 MED ORDER — IOHEXOL 350 MG/ML SOLN
100.0000 mL | Freq: Once | INTRAVENOUS | Status: AC | PRN
  Administered 2022-12-02: 75 mL via INTRAVENOUS

## 2022-12-02 MED ORDER — CARVEDILOL 25 MG PO TABS
25.0000 mg | ORAL_TABLET | Freq: Two times a day (BID) | ORAL | Status: DC
  Filled 2022-12-02: qty 1

## 2022-12-02 MED ORDER — AMLODIPINE BESYLATE 5 MG PO TABS
5.0000 mg | ORAL_TABLET | Freq: Every evening | ORAL | Status: DC
  Filled 2022-12-02: qty 1

## 2022-12-02 MED ORDER — LOSARTAN POTASSIUM 50 MG PO TABS: 100.0000 mg | ORAL_TABLET | Freq: Every morning | ORAL | Status: DC

## 2022-12-02 MED ORDER — EPINEPHRINE 0.3 MG/0.3ML IJ SOAJ
0.3000 mg | INTRAMUSCULAR | Status: DC | PRN
  Filled 2022-12-02: qty 0.3

## 2022-12-02 MED ORDER — ENOXAPARIN SODIUM 40 MG/0.4ML IJ SOSY
40.0000 mg | PREFILLED_SYRINGE | INTRAMUSCULAR | Status: DC
  Administered 2022-12-02: 40 mg via SUBCUTANEOUS
  Filled 2022-12-02: qty 0.4

## 2022-12-02 MED ORDER — INSULIN ASPART 100 UNIT/ML IJ SOLN
0.0000 [IU] | Freq: Every day | INTRAMUSCULAR | Status: DC
  Administered 2022-12-02: 5 [IU] via SUBCUTANEOUS

## 2022-12-02 NOTE — H&P (Addendum)
History and Physical   Todd [name]:[id number] DOB: [birth year] DOA: 12/01/2022  PCP: Koren Shiver, DO   Patient coming from: Home   Chief Complaint: Facial Swelling  HPI: Todd [name] is a 77 y.o. male with medical history significant of hypertension, hyperlipidemia, diabetes, nonischemic cardiomyopathy, CAD status post CABG, CHF, hypothyroidism, status post AICD presenting with facial swelling.  Patient states had onset of rash, hives, tongue and lip swelling prior to presenting to the ED. He had had a chicken burrito followed by abdominal pain, nausea, vomiting, diarrhea. Following this tried Benadryl at home without improvement.  She denies any new medications nor any known new exposures.  Reports does have a history of hives but never had any oral involvement.  Initially reported some difficulty swallowing but no trouble breathing.  Symptoms improved while in the ED.  Patient did have episode of transient hypotensive concerning for vasovagal episode after going the bathroom.  In the ED patient was also noted to have lactic acid that was increasing initially from 2.6-3.4 and leukocytosis and observation was requested.  Patient denies fevers, chills, chest pain, shortness of breath.  ED Course: Vital signs in the ED largely stable other than brief/transient hypotension as above.  Lab workup showed CMP with bicarb 19, BUN 28, glucose 354.  CBC with leukocytosis to 15.8.  Lactic acid trend 2.6, 3.4, 2.8.  Troponin negative x 2.  BNP normal.  D-dimer elevated to 2.36.  Blood culture pending.  CTA PE study showed no acute abnormality and was negative for PE.  Patient received EpiPen, Solu-Medrol, Pepcid, Benadryl in the ED.  Also received a liter of fluids and was started on a rate of IV fluids.  Review of Systems: As per HPI otherwise all other systems reviewed and are negative.  Past Medical History:  Diagnosis Date   Coronary artery disease    Status post CABG   Diabetes  mellitus    HTN (hypertension)    Hyperlipidemia    ICD-[employer]ude-CRT    Implanted 2010   Other primary cardiomyopathies    Syncope, vasovagal     Past Surgical History:  Procedure Laterality Date   COLONOSCOPY WITH PROPOFOL N/A 08/30/2021   Procedure: COLONOSCOPY WITH PROPOFOL;  Surgeon: Jeani Hawking, MD;  Location: [facility];  Service: Gastroenterology;  Laterality: N/A;   COLONOSCOPY WITH PROPOFOL N/A 10/17/2022   Procedure: COLONOSCOPY WITH PROPOFOL;  Surgeon: Jeani Hawking, MD;  Location: [facility];  Service: Gastroenterology;  Laterality: N/A;   CORONARY ARTERY BYPASS GRAFT     CORONARY STENT PLACEMENT     KNEE ARTHROPLASTY Left    POLYPECTOMY  08/30/2021   Procedure: POLYPECTOMY;  Surgeon: Jeani Hawking, MD;  Location: [facility];  Service: Gastroenterology;;   POLYPECTOMY  10/17/2022   Procedure: POLYPECTOMY;  Surgeon: Jeani Hawking, MD;  Location: [facility];  Service: Gastroenterology;;    Social History  reports that he quit smoking about 55 years ago. His smoking use included cigarettes. He has never used smokeless tobacco. He reports that he does not currently use alcohol. He reports that he does not use drugs.  Allergies  Allergen Reactions   Shellfish Allergy Hives    Family History  Problem Relation Age of Onset   Diabetes Sister    Hyperlipidemia Sister    Heart disease Brother    Hypertension Brother    Hyperlipidemia Brother    Heart disease Brother    Diabetes Brother    Hypertension Brother    Hyperlipidemia Brother  Diabetes Brother    Hypertension Brother    Hyperlipidemia Brother   Reviewed on admission  Prior to Admission medications   Medication Sig Start Date End Date Taking? Authorizing Provider  amLODipine (NORVASC) 5 MG tablet Take 5 mg by mouth every evening.   Yes [provider]  aspirin 81 MG EC tablet Take 81 mg by mouth every evening. 10/16/20  Yes [provider]  atorvastatin (LIPITOR) 40 MG tablet  Take 40 mg by mouth every evening.   Yes [provider]  carvedilol (COREG) 25 MG tablet Take 25 mg by mouth 2 (two) times daily with a meal.   Yes [provider]  Cholecalciferol 50 MCG (2000 UT) TABS Take 2,000 Units by mouth in the morning. 04/18/20  Yes [provider]  diphenhydrAMINE (BENADRYL) 25 MG tablet Take 25 mg by mouth every 6 (six) hours as needed for allergies.   Yes [provider]  empagliflozin (JARDIANCE) 25 MG TABS tablet Take 25 mg by mouth in the morning.   Yes [provider]  Esomeprazole Magnesium 20 MG TBEC Take 20 mg by mouth every other day.   Yes [provider]  folic acid (FOLVITE) 1 MG tablet Take 1 mg by mouth in the morning.   Yes [provider]  icosapent Ethyl (VASCEPA) 1 g capsule TAKE 2 CAPSULES (2 G TOTAL) BY MOUTH 2 (TWO) TIMES DAILY. 04/03/21  Yes Lyn Records, MD  levothyroxine (SYNTHROID, LEVOTHROID) 50 MCG tablet Take 50 mcg by mouth in the morning.   Yes [provider]  losartan (COZAAR) 100 MG tablet Take 100 mg by mouth in the morning.   Yes [provider]  metFORMIN (GLUCOPHAGE) 1000 MG tablet Take 1,000 mg by mouth 2 (two) times daily with a meal.   Yes [provider]  Multiple Vitamin (MULTIVITAMIN WITH MINERALS) TABS tablet Take 1 tablet by mouth in the morning.   Yes [provider]  OZEMPIC, 0.25 OR 0.5 MG/DOSE, 2 MG/1.5ML SOPN Inject 0.5 mg into the skin every Thursday. 08/19/21  Yes [provider]  pramipexole (MIRAPEX) 0.25 MG tablet Take 0.25 mg by mouth every evening.   Yes [provider]  acetaminophen (TYLENOL) 650 MG CR tablet Take 1,300 mg by mouth every 8 (eight) hours as needed for pain.    [provider]    Physical Exam: Vitals:   12/02/22 1109 12/02/22 1246 12/02/22 1414 12/02/22 1510  BP:  103/71 109/71 117/72  Pulse:  80 78 83  Resp:  (!) 22 20 20   Temp: 98 F (36.7 C)  98.4 F (36.9 C)  98.1 F (36.7 C)  TempSrc: Oral  Oral Oral  SpO2:  98% 96% 97%  Weight:   79.8 kg   Height:   5\' 11"  (1.803 m)     Physical Exam Constitutional:      General: He is not in acute distress.    Appearance: Normal appearance.  HENT:     Head: Normocephalic and atraumatic.     Mouth/Throat:     Mouth: Mucous membranes are moist.     Pharynx: Oropharynx is clear.  Eyes:     Extraocular Movements: Extraocular movements intact.     Pupils: Pupils are equal, round, and reactive to light.  Cardiovascular:     Rate and Rhythm: Normal rate and regular rhythm.     Pulses: Normal pulses.     Heart sounds: Normal heart sounds.  Pulmonary:     Effort:  Pulmonary effort is normal. No respiratory distress.     Breath sounds: Normal breath sounds.  Abdominal:     General: Bowel sounds are normal. There is no distension.     Palpations: Abdomen is soft.     Tenderness: There is no abdominal tenderness.  Musculoskeletal:        General: No swelling or deformity.     Comments: Minimal hand edema  Skin:    General: Skin is warm and dry.  Neurological:     General: No focal deficit present.     Mental Status: Mental status is at baseline.    Labs on Admission: I have personally reviewed following labs and imaging studies  CBC: Recent Labs  Lab 12/01/22 2335  WBC 15.8*  NEUTROABS 13.5*  HGB 16.6  HCT 48.0  MCV 85.0  PLT 225    Basic Metabolic Panel: Recent Labs  Lab 12/01/22 2335  NA 137  K 4.3  CL 104  CO2 19*  GLUCOSE 354*  BUN 28*  CREATININE 1.08  CALCIUM 9.9    GFR: Estimated Creatinine Clearance: 61 mL/min (by C-G formula based on SCr of 1.08 mg/dL).  Liver Function Tests: Recent Labs  Lab 12/01/22 2335  AST 11*  ALT 13  ALKPHOS 42  BILITOT 0.8  PROT 6.7  ALBUMIN 4.4    Urine analysis: No results found for: "COLORURINE", "APPEARANCEUR", "LABSPEC", "PHURINE", "GLUCOSEU", "HGBUR", "BILIRUBINUR", "KETONESUR", "PROTEINUR", "UROBILINOGEN", "NITRITE",  "LEUKOCYTESUR"  Radiological Exams on Admission: CT Angio Chest PE W/Cm &/Or Wo Cm  Result Date: 12/02/2022 CLINICAL DATA:  Hypotension and hypoxia.  Positive D-dimer. EXAM: CT ANGIOGRAPHY CHEST WITH CONTRAST TECHNIQUE: Multidetector CT imaging of the chest was performed using the standard protocol during bolus administration of intravenous contrast. Multiplanar CT image reconstructions and MIPs were obtained to evaluate the vascular anatomy. RADIATION DOSE REDUCTION: This exam was performed according to the departmental dose-optimization program which includes automated exposure control, adjustment of the mA and/or kV according to patient size and/or use of iterative reconstruction technique. CONTRAST:  75mL OMNIPAQUE IOHEXOL 350 MG/ML SOLN COMPARISON:  None Available. FINDINGS: Cardiovascular: Negative for acute pulmonary embolism. Coronary artery and aortic atherosclerotic calcification. No pericardial effusion. Sternotomy and CABG. Left chest wall ICD. Mediastinum/Nodes: Trachea and esophagus are unremarkable. No thoracic adenopathy. Lungs/Pleura: No focal consolidation, pleural effusion, or pneumothorax. Upper Abdomen: No acute abnormality. Musculoskeletal: No acute fracture. Review of the MIP images confirms the above findings. IMPRESSION: 1. Negative for acute pulmonary embolism. Aortic Atherosclerosis (ICD10-I70.0). Electronically Signed   By: Minerva Fester M.D.   On: 12/02/2022 03:20    EKG: Independently reviewed.  Paced rhythm at 72 bpm.  Baseline wander.  PVC.  Assessment/Plan Principal Problem:   Angioedema Active Problems:   Coronary artery disease involving coronary bypass graft of native heart with angina pectoris (HCC)   Diabetes mellitus, type II (HCC)   Essential hypertension   Hyperlipidemia   CHF (congestive heart failure) (HCC)   Angioedema/anaphylaxis Leukocytosis Elevated lactic acid > Patient presenting with rash, hives, angioedema.  No known exposure and no new  medications.  Known shellfish allergy, but recently tested negative and had been reintroducing this.  Unclear if he was resensitized and could have been contact cross-contamination in the [place] he had. > Not on ACE inhibitor, but is not on ARB we will hold this for now. > No recent Red meat, [place] no [place] tick bite > Did have hives along with angioedema so not suspicious for complement deficiency >  In ED noted to have leukocytosis and uptrending lactic acid which is now down trended, following transient hypotension.  Patient to be monitored overnight in the setting.  Suspect reactive leukocytosis as there is no source with normal CTA PE study.  Blood cultures are pending. > Lactic acid continues to fluctuate with the trend of 2.6, 3.4, 2.8, 3.6.  No evidence of compartment syndrome, minimal hand edema.  No significant pain. - Continue to monitor on telemetry overnight - Trend fever curve and WBC - Trend lactic acid - Follow-up blood cultures, urinalysis - As needed EpiPen for anaphylaxis - Start with full liquid diet and advance as tolerated - Hold losartan  Hypertension - Continue home carvedilol - Hold amlodipine - Hold home losartan as above  Hyperlipidemia CAD > Status post CABG - Continue home atorvastatin, Vascepa - Continue home carvedilol - Holding home losartan as above  Diabetes - SSI  Hypothyroidism - Continue home Synthroid  Nonischemic cardiomyopathy  Chronic diastolic CHF > Last echo 2023 with EF 50-55%, G1 DD, normal RV function.  History of ICD placement - Not on diuretic - Continue home carvedilol - Holding home losartan as above  DVT prophylaxis: Lovenox Code Status:   Full Family Communication:  Updated at bedside Disposition Plan:   Patient is from:  Home  Anticipated DC to:  Home  Anticipated DC date:  1 to 2 days  Anticipated DC barriers: None  Consults called:  None Admission status:  Observation, progressive  Severity of  Illness: The appropriate patient status for this patient is OBSERVATION. Observation status is judged to be reasonable and necessary in order to provide the required intensity of service to ensure the patient's safety. The patient's presenting symptoms, physical exam findings, and initial radiographic and laboratory data in the context of their medical condition is felt to place them at decreased risk for further clinical deterioration. Furthermore, it is anticipated that the patient will be medically stable for discharge from the hospital within 2 midnights of admission.    Synetta Fail MD [facility]  How to contact the [facility] Attending or Consulting provider 7A - 7P or covering provider during after hours 7P -7A, for this patient?   Check the care team in [facility] and look for a) attending/consulting [facility] provider listed and b) the [facility] team listed Log into [email or web address] and use [place]'s universal password to access. If you do not have the password, please contact the hospital operator. Locate the [facility] provider you are looking for under [facility] and page to a number that you can be directly reached. If you still have difficulty reaching the provider, please page the [facility] (Director on Call) for the Hospitalists listed on amion for assistance.  12/02/2022, 4:24 PM

## 2022-12-02 NOTE — ED Notes (Signed)
Patient ambulatory to restroom  ?

## 2022-12-02 NOTE — ED Notes (Addendum)
Patient back to room and up in recliner.  No distress no complaints monitoring ongoing. Took patients oxygen off and sats are remaining above 95%

## 2022-12-02 NOTE — ED Notes (Signed)
Patient placed on 3LNC at this time d/t desaturations. RN aware

## 2022-12-02 NOTE — ED Notes (Signed)
ED TO INPATIENT HANDOFF REPORT  ED Nurse Name and Phone #: [phone]  S Name/Age/Gender Todd [name] 77 y.o. male Room/Bed: DB002/DB002  Code Status   Code Status: Not on file  Home/SNF/Other Home Patient oriented to: self, place, time, and situation Is this baseline? Yes   Triage Complete: Triage complete  Chief Complaint Angioedema [T78.3XXA]  Triage Note Pt having allergic reaction unknown what has called this.   Pt's is diaphoretic, hands, lip and tongue are swollen. Pt states he is having trouble swallowing.   Vomiting in triage 2 benadryl given 2245 Rash noted to rt. Bilateral inner arms and pt states he has rash and itching between legs Rash has disappeared but bilateral hands are still swollen.  Initially had diarrhea last night but normal BM this morning.     Allergies Allergies  Allergen Reactions   Shellfish Allergy Hives    Level of Care/Admitting Diagnosis ED Disposition     ED Disposition  Admit   Condition  --   Comment  Hospital Area: [facility] [[id number]]  Level of Care: Progressive [102]  Admit to Progressive based on following criteria: MULTISYSTEM THREATS such as stable sepsis, metabolic/electrolyte imbalance with or without encephalopathy that is responding to early treatment.  Admit to Progressive based on following criteria: Other see comments  Comments: angioedema  Interfacility transfer: Yes  May place patient in observation at [facility] or [facility] if equivalent level of care is available:: No  Covid Evaluation: Asymptomatic - no recent exposure (last 10 days) testing not required  Diagnosis: Angioedema [[id number]]  Admitting Physician: Magnus Ivan [[id number]]  Attending Physician: Magnus Ivan [[id number]]          B Medical/Surgery History Past Medical History:  Diagnosis Date   Coronary artery disease    Status post CABG   Diabetes mellitus    HTN (hypertension)    Hyperlipidemia     ICD-St.Jude-CRT    Implanted 2010   Other primary cardiomyopathies    Syncope, vasovagal    Past Surgical History:  Procedure Laterality Date   COLONOSCOPY WITH PROPOFOL N/A 08/30/2021   Procedure: COLONOSCOPY WITH PROPOFOL;  Surgeon: Jeani Hawking, MD;  Location: [facility];  Service: Gastroenterology;  Laterality: N/A;   COLONOSCOPY WITH PROPOFOL N/A 10/17/2022   Procedure: COLONOSCOPY WITH PROPOFOL;  Surgeon: Jeani Hawking, MD;  Location: [facility];  Service: Gastroenterology;  Laterality: N/A;   CORONARY ARTERY BYPASS GRAFT     CORONARY STENT PLACEMENT     KNEE ARTHROPLASTY Left    POLYPECTOMY  08/30/2021   Procedure: POLYPECTOMY;  Surgeon: Jeani Hawking, MD;  Location: [facility];  Service: Gastroenterology;;   POLYPECTOMY  10/17/2022   Procedure: POLYPECTOMY;  Surgeon: Jeani Hawking, MD;  Location: [facility];  Service: Gastroenterology;;     A IV Location/Drains/Wounds Patient Lines/Drains/Airways Status     Active Line/Drains/Airways     Name Placement date Placement time Site Days   Peripheral IV 12/01/22 18 G Anterior;Distal;Right Forearm 12/01/22  2336  Forearm  1            Intake/Output Last 24 hours No intake or output data in the 24 hours ending 12/02/22 1025  Labs/Imaging Results for orders placed or performed during the hospital encounter of 12/01/22 (from the past 48 hour(s))  CBC with Differential     Status: Abnormal   Collection Time: 12/01/22 11:35 PM  Result Value Ref Range   WBC 15.8 (H) 4.0 - 10.5 K/uL   RBC 5.65 4.22 -  5.81 MIL/uL   Hemoglobin 16.6 13.0 - 17.0 g/dL   HCT 54.0 98.1 - 19.1 %   MCV 85.0 80.0 - 100.0 fL   MCH 29.4 26.0 - 34.0 pg   MCHC 34.6 30.0 - 36.0 g/dL   RDW 47.8 29.5 - 62.1 %   Platelets 225 150 - 400 K/uL   nRBC 0.0 0.0 - 0.2 %   Neutrophils Relative % 87 %   Neutro Abs 13.5 (H) 1.7 - 7.7 K/uL   Lymphocytes Relative 9 %   Lymphs Abs 1.4 0.7 - 4.0 K/uL   Monocytes Relative 4 %   Monocytes Absolute 0.7 0.1 -  1.0 K/uL   Eosinophils Relative 0 %   Eosinophils Absolute 0.1 0.0 - 0.5 K/uL   Basophils Relative 0 %   Basophils Absolute 0.0 0.0 - 0.1 K/uL   Immature Granulocytes 0 %   Abs Immature Granulocytes 0.07 0.00 - 0.07 K/uL    Comment: Performed at [facility], [address] [place], [place] [address]  Comprehensive metabolic panel     Status: Abnormal   Collection Time: 12/01/22 11:35 PM  Result Value Ref Range   Sodium 137 135 - 145 mmol/L   Potassium 4.3 3.5 - 5.1 mmol/L   Chloride 104 98 - 111 mmol/L   CO2 19 (L) 22 - 32 mmol/L   Glucose, Bld 354 (H) 70 - 99 mg/dL    Comment: Glucose reference range applies only to samples taken after fasting for at least 8 hours.   BUN 28 (H) 8 - 23 mg/dL   Creatinine, Ser 7.84 0.61 - 1.24 mg/dL   Calcium 9.9 8.9 - 69.6 mg/dL   Total Protein 6.7 6.5 - 8.1 g/dL   Albumin 4.4 3.5 - 5.0 g/dL   AST 11 (L) 15 - 41 U/L   ALT 13 0 - 44 U/L   Alkaline Phosphatase 42 38 - 126 U/L   Total Bilirubin 0.8 0.3 - 1.2 mg/dL   GFR, Estimated >29 >52 mL/min    Comment: (NOTE) Calculated using the CKD-EPI Creatinine Equation (2021)    Anion gap 14 5 - 15    Comment: Performed at [facility], [address] [place], [place] [address]  D-dimer, quantitative     Status: Abnormal   Collection Time: 12/01/22 11:35 PM  Result Value Ref Range   D-Dimer, Quant 2.36 (H) 0.00 - 0.50 ug/mL-FEU    Comment: (NOTE) At the manufacturer cut-off value of 0.5 g/mL FEU, this assay has a negative predictive value of 95-100%.This assay is intended for use in conjunction with a clinical pretest probability (PTP) assessment model to exclude pulmonary embolism (PE) and deep venous thrombosis (DVT) in outpatients suspected of PE or DVT. Results should be correlated with clinical presentation. Performed at [facility], [address] [place], [place] [address]   Brain natriuretic peptide     Status: None    Collection Time: 12/01/22 11:35 PM  Result Value Ref Range   B Natriuretic Peptide 17.4 0.0 - 100.0 pg/mL    Comment: Performed at [facility], [address] [place], [place] [address]  Troponin I (High Sensitivity)     Status: None   Collection Time: 12/01/22 11:35 PM  Result Value Ref Range   Troponin I (High Sensitivity) 3 <18 ng/L    Comment: (NOTE) Elevated high sensitivity troponin I (hsTnI) values and significant  changes across serial measurements may suggest ACS but many other  chronic and acute conditions are  known to elevate hsTnI results.  Refer to the "Links" section for chest pain algorithms and additional  guidance. Performed at [facility], [address], [place], [place] [address]   CBG monitoring, ED     Status: Abnormal   Collection Time: 12/02/22 12:56 AM  Result Value Ref Range   Glucose-Capillary 319 (H) 70 - 99 mg/dL    Comment: Glucose reference range applies only to samples taken after fasting for at least 8 hours.  Lactic acid, plasma     Status: Abnormal   Collection Time: 12/02/22  1:49 AM  Result Value Ref Range   Lactic Acid, Venous 2.6 (HH) 0.5 - 1.9 mmol/L    Comment: CRITICAL RESULT CALLED TO, READ BACK BY AND VERIFIED WITH: LOU M, RN BY RP 12/02/22 0231 Performed at [facility], [address], [place], [place] [address]   Lactic acid, plasma     Status: Abnormal   Collection Time: 12/02/22  3:51 AM  Result Value Ref Range   Lactic Acid, Venous 3.4 (HH) 0.5 - 1.9 mmol/L    Comment: CRITICAL RESULT CALLED TO, READ BACK BY AND VERIFIED WITH: LOU M. RN, 12/02/22 0428 BY RP Performed at [facility], [address], [place], [place] [address]   Troponin I (High Sensitivity)     Status: None   Collection Time: 12/02/22  3:51 AM  Result Value Ref Range   Troponin I (High Sensitivity) 3 <18 ng/L    Comment: (NOTE) Elevated high sensitivity troponin I (hsTnI)  values and significant  changes across serial measurements may suggest ACS but many other  chronic and acute conditions are known to elevate hsTnI results.  Refer to the "Links" section for chest pain algorithms and additional  guidance. Performed at [facility], [address] [place], [place] [address]   Lactic acid, plasma     Status: Abnormal   Collection Time: 12/02/22  8:45 AM  Result Value Ref Range   Lactic Acid, Venous 2.8 (HH) 0.5 - 1.9 mmol/L    Comment: CRITICAL RESULT CALLED TO, READ BACK BY AND VERIFIED WITH: M,FOUNTAINS AT 2956 ON 12/02/22 BY A,MOHAMED Performed at [facility], [address] [place], [place] [address]    CT Angio Chest PE W/Cm &/Or Wo Cm  Result Date: 12/02/2022 CLINICAL DATA:  Hypotension and hypoxia.  Positive D-dimer. EXAM: CT ANGIOGRAPHY CHEST WITH CONTRAST TECHNIQUE: Multidetector CT imaging of the chest was performed using the standard protocol during bolus administration of intravenous contrast. Multiplanar CT image reconstructions and MIPs were obtained to evaluate the vascular anatomy. RADIATION DOSE REDUCTION: This exam was performed according to the departmental dose-optimization program which includes automated exposure control, adjustment of the mA and/or kV according to patient size and/or use of iterative reconstruction technique. CONTRAST:  75mL OMNIPAQUE IOHEXOL 350 MG/ML SOLN COMPARISON:  None Available. FINDINGS: Cardiovascular: Negative for acute pulmonary embolism. Coronary artery and aortic atherosclerotic calcification. No pericardial effusion. Sternotomy and CABG. Left chest wall ICD. Mediastinum/Nodes: Trachea and esophagus are unremarkable. No thoracic adenopathy. Lungs/Pleura: No focal consolidation, pleural effusion, or pneumothorax. Upper Abdomen: No acute abnormality. Musculoskeletal: No acute fracture. Review of the MIP images confirms the above findings. IMPRESSION: 1. Negative for acute  pulmonary embolism. Aortic Atherosclerosis (ICD10-I70.0). Electronically Signed   By: Minerva Fester M.D.   On: 12/02/2022 03:20    Pending Labs Unresulted Labs (From admission, onward)     Start     Ordered   12/02/22 0829  Lactic acid,  plasma  Now then every 2 hours,   R (with STAT occurrences)      12/02/22 0828   12/02/22 0138  Culture, blood (routine x 2)  BLOOD CULTURE X 2,   R (with STAT occurrences)      12/02/22 0137            Vitals/Pain Today's Vitals   12/02/22 0749 12/02/22 0800 12/02/22 0930 12/02/22 1010  BP:  112/70 107/73 97/67  Pulse:  82 77 79  Resp:  19 16 19   Temp: (!) 97.5 F (36.4 C)     TempSrc: Tympanic     SpO2:  100% 100% 96%  Weight:      Height:      PainSc:        Isolation Precautions No active isolations  Medications Medications  lactated ringers infusion ( Intravenous New Bag/Given 12/02/22 0504)  EPINEPHrine (EPI-PEN) injection 0.3 mg (0.3 mg Intramuscular Given 12/01/22 2350)  famotidine (PEPCID) IVPB 20 mg premix (0 mg Intravenous Stopped 12/02/22 0104)  diphenhydrAMINE (BENADRYL) injection 25 mg (25 mg Intravenous Given 12/01/22 2349)  methylPREDNISolone sodium succinate (SOLU-MEDROL) 125 mg/2 mL injection 125 mg (125 mg Intravenous Given 12/02/22 0002)  lactated ringers bolus 1,000 mL (0 mLs Intravenous Stopped 12/02/22 0303)  lactated ringers bolus 1,000 mL (0 mLs Intravenous Stopped 12/02/22 0303)  EPINEPHrine (EPI-PEN) injection 0.3 mg (0.3 mg Intramuscular Given 12/02/22 0208)  iohexol (OMNIPAQUE) 350 MG/ML injection 100 mL (75 mLs Intravenous Contrast Given 12/02/22 0247)    Mobility walks     Focused Assessments Cardiac Assessment Handoff:  Cardiac Rhythm: Atrial paced, Ventricular paced No results found for: "CKTOTAL", "CKMB", "CKMBINDEX", "TROPONINI" Lab Results  Component Value Date   DDIMER 2.36 (H) 12/01/2022   Does the Patient currently have chest pain? No   , Pulmonary Assessment Handoff:  Lung sounds:   O2  Device: Nasal Cannula O2 Flow Rate (L/min): 3 L/min    R Recommendations: See Admitting Provider Note  Report given to:   Additional Notes: Lactic is elevated but no source of infection so no antibiotics given Will wait until blood cultures result.

## 2022-12-02 NOTE — Progress Notes (Signed)
   Patient Name: Todd [name], Todd [name] DOB: Feb 03, 1946 MRN: [id number] Transferring facility: [facility] Requesting provider: sheldon, md Reason for transfer: angioedema 77 yo WM with hx of NICM s/p BiV pacer, now recovered EF, CAD, HTN, presents to ER with sudden onset of rash, hand/lip/tongue swelling. received IM epi, solumedrol, pepcid. became hypotensive in ER. given another dose of IM epi. rash now resolved. increasing lactic acid. WBC 15. CTPA negative for PE/pneumonia. Going to: [facility] Admission Status: obs Bed Type: progressive To Do:  [facility] will assume care on arrival to accepting facility. Until arrival, medical decision making responsibilities remain with the EDP.  However, [facility] available 24/7 for questions and assistance.   Nursing staff please page [facility] Admits and Consults [phone]) as soon as the patient arrives to the hospital.  Carollee Herter, DO [facility]

## 2022-12-02 NOTE — ED Notes (Signed)
Patient blood pressure drops to 68/50, patient becomes diaphoretic. Dr. Bernette Mayers notified, see [facility]

## 2022-12-02 NOTE — ED Notes (Signed)
Patient ambulatory to bathroom again with steady gait.  Patient back in bed at this time awaiting [facility] to transfer him to [facility]

## 2022-12-02 NOTE — ED Notes (Signed)
Ambulatory to bathroom with steady gait

## 2022-12-02 NOTE — ED Notes (Signed)
Called [facility] -- informed that pt bed assignment is ready 

## 2022-12-02 NOTE — ED Notes (Signed)
This nurse called to room by other RN, patient returning from bathroom via wheelchair and had a episode of not responding to verbal commands, staring off, and very diaphoretic. When this nurse entered room patient was able to talk, states he was very hot. Dr. Bernette Mayers requested to bedside by this nurse. Patient assisted back to bed from wheelchair. Patient family present at bedside. Will continue to monitor.

## 2022-12-02 NOTE — ED Notes (Signed)
Lab called to add D-dimer and BNP to previous blood work

## 2022-12-02 NOTE — Inpatient Diabetes Management (Signed)
Inpatient Diabetes Program Recommendations  AACE/ADA: New Consensus Statement on Inpatient Glycemic Control (2015)  Target Ranges:  Prepandial:   less than 140 mg/dL      Peak postprandial:   less than 180 mg/dL (1-2 hours)      Critically ill patients:  140 - 180 mg/dL   Lab Results  Component Value Date   GLUCAP 319 (H) 12/02/2022    Review of Glycemic Control  Latest Reference Range & Units 12/02/22 00:56  Glucose-Capillary 70 - 99 mg/dL 914 (H)   Diabetes history: DM 2 Outpatient Diabetes medications: Jardiance 25 mg Daily, Metformin 1000 mg bid, Ozempic 0.5 mg weekly Current orders for Inpatient glycemic control:  None being evaluated in the ED  Received IM Epi x2 and Solumedrol 125 x1 dose  Glucose 319  Inpatient Diabetes Program Recommendations:    -  Add Novolog 0-15 units Q4 hours  Thanks,  Christena Deem RN, MSN, BC-ADM Inpatient Diabetes Coordinator Team Pager [phone] (8a-5p)

## 2022-12-02 NOTE — ED Notes (Addendum)
Called [facility] to let them know patient has ready bed and they are going to dispatch a truck out. Family updated.

## 2022-12-02 NOTE — Progress Notes (Signed)
CRITICAL VALUE STICKER  CRITICAL VALUE: lactic acid 3.6  RECEIVER (on-site recipient of call): Elita Quick, RN  DATE & TIME NOTIFIED:   MESSENGER (representative from lab): see results review  MD NOTIFIED: Beola Cord   TIME OF NOTIFICATION: 1736  RESPONSE:  MD states he is aware

## 2022-12-02 NOTE — ED Notes (Addendum)
Patient ambulated to rest room. Pulled call alert light  and reported "I dont feel right I feel like I'm going to faint" Assisted patient with self care and to a wheelchair. Once in room patient had dead stare and did not answer for approximately 10 seconds. Patient was cool clammy and diaphoretic when helped back to bed. Patient primary nurse and provider to bedside to eval

## 2022-12-03 DIAGNOSIS — J9601 Acute respiratory failure with hypoxia: Secondary | ICD-10-CM | POA: Diagnosis not present

## 2022-12-03 DIAGNOSIS — I959 Hypotension, unspecified: Secondary | ICD-10-CM | POA: Diagnosis not present

## 2022-12-03 DIAGNOSIS — T782XXA Anaphylactic shock, unspecified, initial encounter: Secondary | ICD-10-CM

## 2022-12-03 DIAGNOSIS — I10 Essential (primary) hypertension: Secondary | ICD-10-CM | POA: Diagnosis not present

## 2022-12-03 DIAGNOSIS — I509 Heart failure, unspecified: Secondary | ICD-10-CM | POA: Diagnosis not present

## 2022-12-03 LAB — COMPREHENSIVE METABOLIC PANEL
ALT: 13 U/L (ref 0–44)
AST: 13 U/L — ABNORMAL LOW (ref 15–41)
Albumin: 3 g/dL — ABNORMAL LOW (ref 3.5–5.0)
Alkaline Phosphatase: 28 U/L — ABNORMAL LOW (ref 38–126)
Anion gap: 8 (ref 5–15)
BUN: 25 mg/dL — ABNORMAL HIGH (ref 8–23)
CO2: 23 mmol/L (ref 22–32)
Calcium: 8.7 mg/dL — ABNORMAL LOW (ref 8.9–10.3)
Chloride: 105 mmol/L (ref 98–111)
Creatinine, Ser: 0.86 mg/dL (ref 0.61–1.24)
GFR, Estimated: >60 mL/min (ref >60)
Glucose, Bld: 179 mg/dL — ABNORMAL HIGH (ref 70–99)
Potassium: 4.2 mmol/L (ref 3.5–5.1)
Sodium: 136 mmol/L (ref 135–145)
Total Bilirubin: 0.8 mg/dL (ref 0.3–1.2)
Total Protein: 5.2 g/dL — ABNORMAL LOW (ref 6.5–8.1)

## 2022-12-03 LAB — CBC
HCT: 37 % — ABNORMAL LOW (ref 39.0–52.0)
Hemoglobin: 12.7 g/dL — ABNORMAL LOW (ref 13.0–17.0)
MCH: 29.7 pg (ref 26.0–34.0)
MCHC: 34.3 g/dL (ref 30.0–36.0)
MCV: 86.4 fL (ref 80.0–100.0)
Platelets: 128 10*3/uL — ABNORMAL LOW (ref 150–400)
RBC: 4.28 MIL/uL (ref 4.22–5.81)
RDW: 13.7 % (ref 11.5–15.5)
WBC: 11.9 10*3/uL — ABNORMAL HIGH (ref 4.0–10.5)
nRBC: 0 % (ref 0.0–0.2)

## 2022-12-03 LAB — CULTURE, BLOOD (ROUTINE X 2)

## 2022-12-03 LAB — GLUCOSE, CAPILLARY: Glucose-Capillary: 223 mg/dL — ABNORMAL HIGH (ref 70–99)

## 2022-12-03 MED ORDER — EPINEPHRINE 0.3 MG/0.3ML IJ SOAJ: 0.3000 mg | INTRAMUSCULAR | 1 refills | Status: AC | PRN

## 2022-12-03 MED ORDER — AMLODIPINE BESYLATE 5 MG PO TABS: 5.0000 mg | ORAL_TABLET | Freq: Every evening | ORAL | Status: AC

## 2022-12-03 NOTE — Discharge Summary (Signed)
PATIENT DETAILS Name: Todd [name] Age: 77 y.o. Sex: male Date of Birth: December 07, 1945 MRN: [id number]. Admitting Physician: Maretta Bees, MD [id number], Wille Celeste, DO  Admit Date: 12/01/2022 Discharge date: 12/03/2022  Recommendations for Outpatient Follow-up:  Follow up with PCP in 1-2 weeks Please obtain CMP/CBC in one week Blood cultures pending-please follow-up.  Admitted From:  Home  Disposition: Home   Discharge Condition: good  CODE STATUS:   Code Status: Full Code   Diet recommendation:  Diet Order             Diet heart healthy/carb modified Fluid consistency: Thin  Diet effective now           Diet - low sodium heart healthy           Diet Carb Modified                    Brief Summary: 77 year old male with history of HTN, HLD, DM-2, CAD s/p CABG, AICD placement-presented with hives, tongue/lip swelling after eating other local [place] restaurant (chicken burrito).  He has a known shellfish allergy-but apparently was resensitized in the recent past.  Significant studies 6/25>> CTA chest: No PE. 6/25>> blood cultures: Pending  Brief Hospital Course: Anaphylaxis with angioedema/hives Unclear exactly what the etiology/offending agent was-but this occurred after patient ate a chicken burrito at a [employer].  He has a history of shellfish allergy but was recently resensitized-suspicion that some ingredient in the [place] may have had a cross-contamination with possible seafood/shellfish. In the event-he was managed with EpiPen/steroids/Benadryl-he feels much better this morning-angioedema/hives have all resolved.  Mild leukocytosis Unclear etiology-no evidence of infection-downtrending CTA chest negative for pneumonia Has no foci of infection by history or exam Blood cultures pending-will need to be followed  HTN Continue Coreg today Resume amlodipine from tomorrow Hold losartan until seen by PCP-although not felt to have  angioedema secondary to losartan use-likely some food product allergy-holding losartan till all of his symptoms settle down.  Probably could be resumed by his PCP/cardiology upon follow-up.  Discussed with patient at bedside. Note-transiently developed hypotension while in the ED that has since resolved  CAD s/p CABG No anginal symptoms Aspirin/statin/beta-blocker  Ischemic cardiomyopathy Chronic HFpEF S/p ICD placement Euvolemic Continue beta-blocker/[employer] losartan-see above  Hypothyroidism Synthroid  DM-2 Controlled during this hospitalization with SSI Resume oral hypoglycemic agents on discharge   Discharge Diagnoses:  Principal Problem:   Angioedema Active Problems:   Coronary artery disease involving coronary bypass graft of native heart with angina pectoris (HCC)   Diabetes mellitus, type II (HCC)   Essential hypertension   Hyperlipidemia   CHF (congestive heart failure) (HCC)   Discharge Instructions:  Activity:  As tolerated   Discharge Instructions     Call MD for:  difficulty breathing, headache or visual disturbances   Complete by: As directed    Call MD for:  extreme fatigue   Complete by: As directed    Call MD for:  persistant dizziness or light-headedness   Complete by: As directed    Diet - low sodium heart healthy   Complete by: As directed    Diet Carb Modified   Complete by: As directed    Discharge instructions   Complete by: As directed    Follow with Primary MD  Koren Shiver, DO in 1-2 weeks  Blood cultures were obtained on admission-these are currently pending-please ask your primary care practitioner to follow-up on these results.  Resume amlodipine  starting tomorrow  Holding losartan until seen by primary care practitioner/primary cardiologist  Unclear what the triggering agent was that caused her to have this allergic reaction-have prescribed/sent to your pharmacy epinephrine pen to be used as needed if you have a  life-threatening allergic reaction in the future.  Please get a complete blood count and chemistry panel checked by your Primary MD at your next visit, and again as instructed by your Primary MD.  Get Medicines reviewed and adjusted: Please take all your medications with you for your next visit with your Primary MD  Laboratory/radiological data: Please request your Primary MD to go over all hospital tests and procedure/radiological results at the follow up, please ask your Primary MD to get all Hospital records sent to his/her office.  In some cases, they will be blood work, cultures and biopsy results pending at the time of your discharge. Please request that your primary care M.D. follows up on these results.  Also Note the following: If you experience worsening of your admission symptoms, develop shortness of breath, life threatening emergency, suicidal or homicidal thoughts you must seek medical attention immediately by calling 911 or calling your MD immediately  if symptoms less severe.  You must read complete instructions/literature along with all the possible adverse reactions/side effects for all the Medicines you take and that have been prescribed to you. Take any new Medicines after you have completely understood and accpet all the possible adverse reactions/side effects.   Do not drive when taking Pain medications or sleeping medications (Benzodaizepines)  Do not take more than prescribed Pain, Sleep and Anxiety Medications. It is not advisable to combine anxiety,sleep and pain medications without talking with your primary care practitioner  Special Instructions: If you have smoked or chewed Tobacco  in the last 2 yrs please stop smoking, stop any regular Alcohol  and or any Recreational drug use.  Wear Seat belts while driving.  Please note: You were cared for by a hospitalist during your hospital stay. Once you are discharged, your primary care physician will handle any further  medical issues. Please note that NO REFILLS for any discharge medications will be authorized once you are discharged, as it is imperative that you return to your primary care physician (or establish a relationship with a primary care physician if you do not have one) for your post hospital discharge needs so that they can reassess your need for medications and monitor your lab values.   Increase activity slowly   Complete by: As directed       Allergies as of 12/03/2022       Reactions   Shellfish Allergy Hives        Medication List     STOP taking these medications    losartan 100 MG tablet Commonly known as: COZAAR       TAKE these medications    acetaminophen 650 MG CR tablet Commonly known as: TYLENOL Take 1,300 mg by mouth every 8 (eight) hours as needed for pain.   amLODipine 5 MG tablet Commonly known as: NORVASC Take 1 tablet (5 mg total) by mouth every evening. Start taking on: December 04, 2022   aspirin EC 81 MG tablet Take 81 mg by mouth every evening.   atorvastatin 40 MG tablet Commonly known as: LIPITOR Take 40 mg by mouth every evening.   carvedilol 25 MG tablet Commonly known as: COREG Take 25 mg by mouth 2 (two) times daily with a meal.   Cholecalciferol 50 MCG (  2000 UT) Tabs Take 2,000 Units by mouth in the morning.   diphenhydrAMINE 25 MG tablet Commonly known as: BENADRYL Take 25 mg by mouth every 6 (six) hours as needed for allergies.   empagliflozin 25 MG Tabs tablet Commonly known as: JARDIANCE Take 25 mg by mouth in the morning.   EPINEPHrine 0.3 mg/0.3 mL Soaj injection Commonly known as: EPI-PEN Inject 0.3 mg into the muscle as needed (Anaphylaxsis).   Esomeprazole Magnesium 20 MG Tbec Take 20 mg by mouth every other day.   folic acid 1 MG tablet Commonly known as: FOLVITE Take 1 mg by mouth in the morning.   icosapent Ethyl 1 g capsule Commonly known as: VASCEPA TAKE 2 CAPSULES (2 G TOTAL) BY MOUTH 2 (TWO) TIMES DAILY.    levothyroxine 50 MCG tablet Commonly known as: SYNTHROID Take 50 mcg by mouth in the morning.   metFORMIN 1000 MG tablet Commonly known as: GLUCOPHAGE Take 1,000 mg by mouth 2 (two) times daily with a meal.   multivitamin with minerals Tabs tablet Take 1 tablet by mouth in the morning.   Ozempic (0.25 or 0.5 MG/DOSE) 2 MG/1.5ML Sopn Generic drug: Semaglutide(0.25 or 0.5MG /DOS) Inject 0.5 mg into the skin every Thursday.   pramipexole 0.25 MG tablet Commonly known as: MIRAPEX Take 0.25 mg by mouth every evening.        Follow-up Information     Koren Shiver, DO. Schedule an appointment as soon as possible for a visit in 1 week(s).   Specialty: Family Medicine Contact information: [address] [place] [address] [phone]         Kathleene Hazel, MD. Schedule an appointment as soon as possible for a visit in 2 week(s).   Specialty: Cardiology Contact information: [address] [place] [address] [phone]                Allergies  Allergen Reactions   Shellfish Allergy Hives     Other Procedures/Studies: CT Angio Chest PE W/Cm &/Or Wo Cm  Result Date: 12/02/2022 CLINICAL DATA:  Hypotension and hypoxia.  Positive D-dimer. EXAM: CT ANGIOGRAPHY CHEST WITH CONTRAST TECHNIQUE: Multidetector CT imaging of the chest was performed using the standard protocol during bolus administration of intravenous contrast. Multiplanar CT image reconstructions and MIPs were obtained to evaluate the vascular anatomy. RADIATION DOSE REDUCTION: This exam was performed according to the departmental dose-optimization program which includes automated exposure control, adjustment of the mA and/or kV according to patient size and/or use of iterative reconstruction technique. CONTRAST:  75mL OMNIPAQUE IOHEXOL 350 MG/ML SOLN COMPARISON:  None Available. FINDINGS: Cardiovascular: Negative for acute pulmonary embolism. Coronary artery and aortic  atherosclerotic calcification. No pericardial effusion. Sternotomy and CABG. Left chest wall ICD. Mediastinum/Nodes: Trachea and esophagus are unremarkable. No thoracic adenopathy. Lungs/Pleura: No focal consolidation, pleural effusion, or pneumothorax. Upper Abdomen: No acute abnormality. Musculoskeletal: No acute fracture. Review of the MIP images confirms the above findings. IMPRESSION: 1. Negative for acute pulmonary embolism. Aortic Atherosclerosis (ICD10-I70.0). Electronically Signed   By: Minerva Fester M.D.   On: 12/02/2022 03:20   CUP PACEART REMOTE DEVICE CHECK  Result Date: 11/13/2022 Scheduled remote reviewed. Normal device function.  AMS episodes, 6-18sec in duration, atrial lead noise - known Next remote 91 days. LA, CVRS    TODAY-DAY OF DISCHARGE:  Subjective:   Bessie Livingood today has no headache,no chest abdominal pain,no new weakness tingling or numbness, feels much better wants to go home today.  Objective:   Blood pressure 125/74, pulse 78, temperature 97.8 F (36.6 C), temperature source Oral, resp. rate 15, height 5\' 11"  (1.803 m), weight 79.8 kg, SpO2 94 %.  Intake/Output Summary (Last 24 hours) at 12/03/2022 0823 Last data filed at 12/03/2022 0457 Gross per 24 hour  Intake 2600.13 ml  Output --  Net 2600.13 ml   Filed Weights   12/01/22 2331 12/02/22 1414  Weight: 79.4 kg 79.8 kg    Exam: Awake Alert, Oriented *3, No new F.N deficits, Normal affect .AT,PERRAL Supple Neck,No JVD, No cervical lymphadenopathy appriciated.  Symmetrical Chest wall movement, Good air movement bilaterally, CTAB RRR,No Gallops,Rubs or new Murmurs, No Parasternal Heave +ve B.Sounds, Abd Soft, Non tender, No organomegaly appriciated, No rebound -guarding or rigidity. No Cyanosis, Clubbing or edema, No new Rash or bruise   PERTINENT RADIOLOGIC STUDIES: CT Angio Chest PE W/Cm &/Or Wo Cm  Result Date: 12/02/2022 CLINICAL DATA:  Hypotension and hypoxia.  Positive D-dimer. EXAM:  CT ANGIOGRAPHY CHEST WITH CONTRAST TECHNIQUE: Multidetector CT imaging of the chest was performed using the standard protocol during bolus administration of intravenous contrast. Multiplanar CT image reconstructions and MIPs were obtained to evaluate the vascular anatomy. RADIATION DOSE REDUCTION: This exam was performed according to the departmental dose-optimization program which includes automated exposure control, adjustment of the mA and/or kV according to patient size and/or use of iterative reconstruction technique. CONTRAST:  75mL OMNIPAQUE IOHEXOL 350 MG/ML SOLN COMPARISON:  None Available. FINDINGS: Cardiovascular: Negative for acute pulmonary embolism. Coronary artery and aortic atherosclerotic calcification. No pericardial effusion. Sternotomy and CABG. Left chest wall ICD. Mediastinum/Nodes: Trachea and esophagus are unremarkable. No thoracic adenopathy. Lungs/Pleura: No focal consolidation, pleural effusion, or pneumothorax. Upper Abdomen: No acute abnormality. Musculoskeletal: No acute fracture. Review of the MIP images confirms the above findings. IMPRESSION: 1. Negative for acute pulmonary embolism. Aortic Atherosclerosis (ICD10-I70.0). Electronically Signed   By: Minerva Fester M.D.   On: 12/02/2022 03:20     PERTINENT LAB RESULTS: CBC: Recent Labs    12/02/22 1749 12/03/22 0350  WBC 13.1* 11.9*  HGB 14.8 12.7*  HCT 43.3 37.0*  PLT 156 128*   CMET CMP     Component Value Date/Time   NA 136 12/03/2022 0350   NA 141 02/12/2022 1213   K 4.2 12/03/2022 0350   CL 105 12/03/2022 0350   CO2 23 12/03/2022 0350   GLUCOSE 179 (H) 12/03/2022 0350   BUN 25 (H) 12/03/2022 0350   BUN 16 02/12/2022 1213   CREATININE 0.86 12/03/2022 0350   CALCIUM 8.7 (L) 12/03/2022 0350   PROT 5.2 (L) 12/03/2022 0350   PROT 6.7 04/18/2019 0757   ALBUMIN 3.0 (L) 12/03/2022 0350   ALBUMIN 4.7 04/18/2019 0757   AST 13 (L) 12/03/2022 0350   ALT 13 12/03/2022 0350   ALKPHOS 28 (L) 12/03/2022 0350    BILITOT 0.8 12/03/2022 0350   BILITOT 0.4 04/18/2019 0757   EGFR 86 02/12/2022 1213   GFRNONAA >60 12/03/2022 0350    GFR Estimated Creatinine Clearance: 76.6 mL/min (by C-G formula based on SCr of 0.86 mg/dL). No results for input(s): "LIPASE", "AMYLASE" in the last 72 hours. No results for input(s): "CKTOTAL", "CKMB", "CKMBINDEX", "TROPONINI" in the last 72 hours. Invalid input(s): "POCBNP" Recent Labs    12/01/22 2335  DDIMER 2.36*   No results for input(s): "HGBA1C" in the last 72 hours. No results for input(s): "CHOL", "HDL", "LDLCALC", "TRIG", "CHOLHDL", "LDLDIRECT" in the last 72 hours. No results for input(s): "TSH", "T4TOTAL", "T3FREE", "  THYROIDAB" in the last 72 hours.  Invalid input(s): "FREET3" No results for input(s): "VITAMINB12", "FOLATE", "FERRITIN", "TIBC", "IRON", "RETICCTPCT" in the last 72 hours. Coags: No results for input(s): "INR" in the last 72 hours.  Invalid input(s): "PT" Microbiology: No results found for this or any previous visit (from the past 240 hour(s)).  FURTHER DISCHARGE INSTRUCTIONS:  Get Medicines reviewed and adjusted: Please take all your medications with you for your next visit with your Primary MD  Laboratory/radiological data: Please request your Primary MD to go over all hospital tests and procedure/radiological results at the follow up, please ask your Primary MD to get all Hospital records sent to his/her office.  In some cases, they will be blood work, cultures and biopsy results pending at the time of your discharge. Please request that your primary care M.D. goes through all the records of your hospital data and follows up on these results.  Also Note the following: If you experience worsening of your admission symptoms, develop shortness of breath, life threatening emergency, suicidal or homicidal thoughts you must seek medical attention immediately by calling 911 or calling your MD immediately  if symptoms less severe.  You  must read complete instructions/literature along with all the possible adverse reactions/side effects for all the Medicines you take and that have been prescribed to you. Take any new Medicines after you have completely understood and accpet all the possible adverse reactions/side effects.   Do not drive when taking Pain medications or sleeping medications (Benzodaizepines)  Do not take more than prescribed Pain, Sleep and Anxiety Medications. It is not advisable to combine anxiety,sleep and pain medications without talking with your primary care practitioner  Special Instructions: If you have smoked or chewed Tobacco  in the last 2 yrs please stop smoking, stop any regular Alcohol  and or any Recreational drug use.  Wear Seat belts while driving.  Please note: You were cared for by a hospitalist during your hospital stay. Once you are discharged, your primary care physician will handle any further medical issues. Please note that NO REFILLS for any discharge medications will be authorized once you are discharged, as it is imperative that you return to your primary care physician (or establish a relationship with a primary care physician if you do not have one) for your post hospital discharge needs so that they can reassess your need for medications and monitor your lab values.  Total Time spent coordinating discharge including counseling, education and face to face time equals greater than 30 minutes.  SignedJeoffrey Massed 12/03/2022 8:23 AM

## 2022-12-03 NOTE — Progress Notes (Signed)
No acute events noted overnight. On call contacted re: home med. Pt expresses desire to advance diet; tolerated liquids well overnight.

## 2022-12-03 NOTE — Plan of Care (Signed)
  Problem: Clinical Measurements: Goal: Ability to maintain clinical measurements within normal limits will improve Outcome: Progressing   Problem: Pain Managment: Goal: General experience of comfort will improve Outcome: Progressing   

## 2022-12-03 NOTE — TOC CM/SW Note (Signed)
Transition of Care [facility]) - Inpatient Brief Assessment   Patient Details  Name: Todd [name] MRN: [id number] Date of Birth: 10/26/1945  Transition of Care [facility]) CM/SW Contact:    Gordy Clement, RN Phone Number: 12/03/2022, 9:06 AM   Clinical Narrative:  Patient came to floor from ED on  6/25 late afternoon. From home with Spouse . Patient has insurance (Medicare A and B) and a PCP ,Masneri, [employer], DO.   Patient will DC too home with Spouse today.  There are no TOC needs identified      Transition of Care Asessment: Insurance and Status: Insurance coverage has been reviewed (Medicare A and B) Patient has primary care physician: Yes (Masneri, Wille Celeste, DO) Home environment has been reviewed: Home with Wife Prior level of function:: Independent Prior/Current Home Services: No current home services Social Determinants of Health Reivew: SDOH reviewed no interventions necessary Readmission risk has been reviewed: Yes (None listed) Transition of care needs: no transition of care needs at this time

## 2022-12-05 LAB — CULTURE, BLOOD (ROUTINE X 2): Culture: NO GROWTH

## 2022-12-06 LAB — CULTURE, BLOOD (ROUTINE X 2): Special Requests: ADEQUATE

## 2022-12-07 LAB — CULTURE, BLOOD (ROUTINE X 2)
Culture: NO GROWTH
Special Requests: ADEQUATE

## 2022-12-08 NOTE — Progress Notes (Signed)
Remote ICD transmission.   

## 2022-12-12 DIAGNOSIS — I2510 Atherosclerotic heart disease of native coronary artery without angina pectoris: Secondary | ICD-10-CM | POA: Diagnosis not present

## 2022-12-12 DIAGNOSIS — Z95810 Presence of automatic (implantable) cardiac defibrillator: Secondary | ICD-10-CM | POA: Diagnosis not present

## 2022-12-12 DIAGNOSIS — Z91013 Allergy to seafood: Secondary | ICD-10-CM | POA: Diagnosis not present

## 2022-12-12 DIAGNOSIS — E1159 Type 2 diabetes mellitus with other circulatory complications: Secondary | ICD-10-CM | POA: Diagnosis not present

## 2022-12-12 DIAGNOSIS — I509 Heart failure, unspecified: Secondary | ICD-10-CM | POA: Diagnosis not present

## 2022-12-12 DIAGNOSIS — Z862 Personal history of diseases of the blood and blood-forming organs and certain disorders involving the immune mechanism: Secondary | ICD-10-CM | POA: Diagnosis not present

## 2022-12-12 DIAGNOSIS — Z09 Encounter for follow-up examination after completed treatment for conditions other than malignant neoplasm: Secondary | ICD-10-CM | POA: Diagnosis not present

## 2022-12-12 DIAGNOSIS — E782 Mixed hyperlipidemia: Secondary | ICD-10-CM | POA: Diagnosis not present

## 2022-12-12 DIAGNOSIS — T783XXA Angioneurotic edema, initial encounter: Secondary | ICD-10-CM | POA: Diagnosis not present

## 2022-12-12 DIAGNOSIS — L509 Urticaria, unspecified: Secondary | ICD-10-CM | POA: Diagnosis not present

## 2022-12-12 DIAGNOSIS — I10 Essential (primary) hypertension: Secondary | ICD-10-CM | POA: Diagnosis not present

## 2023-01-06 DIAGNOSIS — G4733 Obstructive sleep apnea (adult) (pediatric): Secondary | ICD-10-CM | POA: Diagnosis not present

## 2023-01-20 DIAGNOSIS — Z0000 Encounter for general adult medical examination without abnormal findings: Secondary | ICD-10-CM | POA: Diagnosis not present

## 2023-01-20 DIAGNOSIS — I2510 Atherosclerotic heart disease of native coronary artery without angina pectoris: Secondary | ICD-10-CM | POA: Diagnosis not present

## 2023-01-20 DIAGNOSIS — I10 Essential (primary) hypertension: Secondary | ICD-10-CM | POA: Diagnosis not present

## 2023-01-20 DIAGNOSIS — E782 Mixed hyperlipidemia: Secondary | ICD-10-CM | POA: Diagnosis not present

## 2023-01-20 DIAGNOSIS — D126 Benign neoplasm of colon, unspecified: Secondary | ICD-10-CM | POA: Diagnosis not present

## 2023-01-20 DIAGNOSIS — I509 Heart failure, unspecified: Secondary | ICD-10-CM | POA: Diagnosis not present

## 2023-01-20 DIAGNOSIS — E559 Vitamin D deficiency, unspecified: Secondary | ICD-10-CM | POA: Diagnosis not present

## 2023-01-20 DIAGNOSIS — G2581 Restless legs syndrome: Secondary | ICD-10-CM | POA: Diagnosis not present

## 2023-01-20 DIAGNOSIS — N400 Enlarged prostate without lower urinary tract symptoms: Secondary | ICD-10-CM | POA: Diagnosis not present

## 2023-01-20 DIAGNOSIS — E1159 Type 2 diabetes mellitus with other circulatory complications: Secondary | ICD-10-CM | POA: Diagnosis not present

## 2023-01-20 DIAGNOSIS — E039 Hypothyroidism, unspecified: Secondary | ICD-10-CM | POA: Diagnosis not present

## 2023-01-20 DIAGNOSIS — E291 Testicular hypofunction: Secondary | ICD-10-CM | POA: Diagnosis not present

## 2023-02-11 ENCOUNTER — Ambulatory Visit (INDEPENDENT_AMBULATORY_CARE_PROVIDER_SITE_OTHER): Payer: Medicare Other

## 2023-02-11 DIAGNOSIS — I428 Other cardiomyopathies: Secondary | ICD-10-CM

## 2023-02-11 LAB — CUP PACEART REMOTE DEVICE CHECK
Battery Remaining Longevity: 19 mo
Battery Remaining Percentage: 24 %
Battery Voltage: 2.8 V
Brady Statistic AP VP Percent: 1.7 %
Brady Statistic AP VS Percent: 1 %
Brady Statistic AS VP Percent: 98 %
Brady Statistic AS VS Percent: 1 %
Brady Statistic RA Percent Paced: 1.5 %
Date Time Interrogation Session: 20240904040022
HighPow Impedance: 50 Ohm
HighPow Impedance: 50 Ohm
Implantable Lead Connection Status: 753985
Implantable Lead Connection Status: 753985
Implantable Lead Connection Status: 753985
Implantable Lead Implant Date: 20101005
Implantable Lead Implant Date: 20101005
Implantable Lead Implant Date: 20101005
Implantable Lead Location: 753858
Implantable Lead Location: 753859
Implantable Lead Location: 753860
Implantable Lead Model: 1944
Implantable Pulse Generator Implant Date: 20171127
Lead Channel Impedance Value: 430 Ohm
Lead Channel Impedance Value: 460 Ohm
Lead Channel Impedance Value: 900 Ohm
Lead Channel Pacing Threshold Amplitude: 0.25 V
Lead Channel Pacing Threshold Amplitude: 0.625 V
Lead Channel Pacing Threshold Amplitude: 2 V
Lead Channel Pacing Threshold Pulse Width: 0.5 ms
Lead Channel Pacing Threshold Pulse Width: 0.5 ms
Lead Channel Pacing Threshold Pulse Width: 1 ms
Lead Channel Sensing Intrinsic Amplitude: 11.9 mV
Lead Channel Sensing Intrinsic Amplitude: 2.4 mV
Lead Channel Setting Pacing Amplitude: 2 V
Lead Channel Setting Pacing Amplitude: 2 V
Lead Channel Setting Pacing Amplitude: 2.5 V
Lead Channel Setting Pacing Pulse Width: 0.5 ms
Lead Channel Setting Pacing Pulse Width: 1 ms
Lead Channel Setting Sensing Sensitivity: 0.5 mV
Pulse Gen Serial Number: 7371595

## 2023-02-16 DIAGNOSIS — J301 Allergic rhinitis due to pollen: Secondary | ICD-10-CM | POA: Diagnosis not present

## 2023-02-16 DIAGNOSIS — Z91018 Allergy to other foods: Secondary | ICD-10-CM | POA: Diagnosis not present

## 2023-02-16 DIAGNOSIS — J3089 Other allergic rhinitis: Secondary | ICD-10-CM | POA: Diagnosis not present

## 2023-02-16 DIAGNOSIS — J3081 Allergic rhinitis due to animal (cat) (dog) hair and dander: Secondary | ICD-10-CM | POA: Diagnosis not present

## 2023-02-16 DIAGNOSIS — L500 Allergic urticaria: Secondary | ICD-10-CM | POA: Diagnosis not present

## 2023-02-18 DIAGNOSIS — M79641 Pain in right hand: Secondary | ICD-10-CM | POA: Diagnosis not present

## 2023-02-18 DIAGNOSIS — M1811 Unilateral primary osteoarthritis of first carpometacarpal joint, right hand: Secondary | ICD-10-CM | POA: Diagnosis not present

## 2023-02-18 DIAGNOSIS — M65341 Trigger finger, right ring finger: Secondary | ICD-10-CM | POA: Diagnosis not present

## 2023-02-18 DIAGNOSIS — M65331 Trigger finger, right middle finger: Secondary | ICD-10-CM | POA: Diagnosis not present

## 2023-02-24 NOTE — Progress Notes (Signed)
Remote ICD transmission.   

## 2023-02-25 DIAGNOSIS — J3089 Other allergic rhinitis: Secondary | ICD-10-CM | POA: Diagnosis not present

## 2023-02-25 DIAGNOSIS — J301 Allergic rhinitis due to pollen: Secondary | ICD-10-CM | POA: Diagnosis not present

## 2023-02-25 DIAGNOSIS — J3081 Allergic rhinitis due to animal (cat) (dog) hair and dander: Secondary | ICD-10-CM | POA: Diagnosis not present

## 2023-02-26 DIAGNOSIS — Z23 Encounter for immunization: Secondary | ICD-10-CM | POA: Diagnosis not present

## 2023-03-04 DIAGNOSIS — J3089 Other allergic rhinitis: Secondary | ICD-10-CM | POA: Diagnosis not present

## 2023-03-04 DIAGNOSIS — J3081 Allergic rhinitis due to animal (cat) (dog) hair and dander: Secondary | ICD-10-CM | POA: Diagnosis not present

## 2023-03-04 DIAGNOSIS — J301 Allergic rhinitis due to pollen: Secondary | ICD-10-CM | POA: Diagnosis not present

## 2023-03-11 DIAGNOSIS — J301 Allergic rhinitis due to pollen: Secondary | ICD-10-CM | POA: Diagnosis not present

## 2023-03-11 DIAGNOSIS — J3081 Allergic rhinitis due to animal (cat) (dog) hair and dander: Secondary | ICD-10-CM | POA: Diagnosis not present

## 2023-03-11 DIAGNOSIS — J3089 Other allergic rhinitis: Secondary | ICD-10-CM | POA: Diagnosis not present

## 2023-03-18 DIAGNOSIS — J3081 Allergic rhinitis due to animal (cat) (dog) hair and dander: Secondary | ICD-10-CM | POA: Diagnosis not present

## 2023-03-18 DIAGNOSIS — J3089 Other allergic rhinitis: Secondary | ICD-10-CM | POA: Diagnosis not present

## 2023-03-18 DIAGNOSIS — J301 Allergic rhinitis due to pollen: Secondary | ICD-10-CM | POA: Diagnosis not present

## 2023-03-23 DIAGNOSIS — L570 Actinic keratosis: Secondary | ICD-10-CM | POA: Diagnosis not present

## 2023-03-23 DIAGNOSIS — Z86018 Personal history of other benign neoplasm: Secondary | ICD-10-CM | POA: Diagnosis not present

## 2023-03-23 DIAGNOSIS — L578 Other skin changes due to chronic exposure to nonionizing radiation: Secondary | ICD-10-CM | POA: Diagnosis not present

## 2023-03-23 DIAGNOSIS — D225 Melanocytic nevi of trunk: Secondary | ICD-10-CM | POA: Diagnosis not present

## 2023-03-23 DIAGNOSIS — L821 Other seborrheic keratosis: Secondary | ICD-10-CM | POA: Diagnosis not present

## 2023-03-26 DIAGNOSIS — J301 Allergic rhinitis due to pollen: Secondary | ICD-10-CM | POA: Diagnosis not present

## 2023-03-26 DIAGNOSIS — J3081 Allergic rhinitis due to animal (cat) (dog) hair and dander: Secondary | ICD-10-CM | POA: Diagnosis not present

## 2023-03-26 DIAGNOSIS — J3089 Other allergic rhinitis: Secondary | ICD-10-CM | POA: Diagnosis not present

## 2023-04-08 DIAGNOSIS — J301 Allergic rhinitis due to pollen: Secondary | ICD-10-CM | POA: Diagnosis not present

## 2023-04-08 DIAGNOSIS — J3089 Other allergic rhinitis: Secondary | ICD-10-CM | POA: Diagnosis not present

## 2023-04-08 DIAGNOSIS — J3081 Allergic rhinitis due to animal (cat) (dog) hair and dander: Secondary | ICD-10-CM | POA: Diagnosis not present

## 2023-04-15 DIAGNOSIS — J301 Allergic rhinitis due to pollen: Secondary | ICD-10-CM | POA: Diagnosis not present

## 2023-04-15 DIAGNOSIS — J3081 Allergic rhinitis due to animal (cat) (dog) hair and dander: Secondary | ICD-10-CM | POA: Diagnosis not present

## 2023-04-15 DIAGNOSIS — J3089 Other allergic rhinitis: Secondary | ICD-10-CM | POA: Diagnosis not present

## 2023-04-22 DIAGNOSIS — M65341 Trigger finger, right ring finger: Secondary | ICD-10-CM | POA: Diagnosis not present

## 2023-04-22 DIAGNOSIS — M1811 Unilateral primary osteoarthritis of first carpometacarpal joint, right hand: Secondary | ICD-10-CM | POA: Diagnosis not present

## 2023-04-23 DIAGNOSIS — J301 Allergic rhinitis due to pollen: Secondary | ICD-10-CM | POA: Diagnosis not present

## 2023-04-23 DIAGNOSIS — J3081 Allergic rhinitis due to animal (cat) (dog) hair and dander: Secondary | ICD-10-CM | POA: Diagnosis not present

## 2023-04-23 DIAGNOSIS — J3089 Other allergic rhinitis: Secondary | ICD-10-CM | POA: Diagnosis not present

## 2023-04-29 DIAGNOSIS — J301 Allergic rhinitis due to pollen: Secondary | ICD-10-CM | POA: Diagnosis not present

## 2023-04-29 DIAGNOSIS — J3089 Other allergic rhinitis: Secondary | ICD-10-CM | POA: Diagnosis not present

## 2023-04-29 DIAGNOSIS — J3081 Allergic rhinitis due to animal (cat) (dog) hair and dander: Secondary | ICD-10-CM | POA: Diagnosis not present

## 2023-05-04 DIAGNOSIS — J3089 Other allergic rhinitis: Secondary | ICD-10-CM | POA: Diagnosis not present

## 2023-05-04 DIAGNOSIS — J3081 Allergic rhinitis due to animal (cat) (dog) hair and dander: Secondary | ICD-10-CM | POA: Diagnosis not present

## 2023-05-04 DIAGNOSIS — J301 Allergic rhinitis due to pollen: Secondary | ICD-10-CM | POA: Diagnosis not present

## 2023-05-11 DIAGNOSIS — J3089 Other allergic rhinitis: Secondary | ICD-10-CM | POA: Diagnosis not present

## 2023-05-11 DIAGNOSIS — J3081 Allergic rhinitis due to animal (cat) (dog) hair and dander: Secondary | ICD-10-CM | POA: Diagnosis not present

## 2023-05-11 DIAGNOSIS — J301 Allergic rhinitis due to pollen: Secondary | ICD-10-CM | POA: Diagnosis not present

## 2023-05-13 ENCOUNTER — Ambulatory Visit (INDEPENDENT_AMBULATORY_CARE_PROVIDER_SITE_OTHER): Payer: Medicare Other

## 2023-05-13 DIAGNOSIS — I428 Other cardiomyopathies: Secondary | ICD-10-CM | POA: Diagnosis not present

## 2023-05-13 LAB — CUP PACEART REMOTE DEVICE CHECK
Battery Remaining Longevity: 16 mo
Battery Remaining Percentage: 19 %
Battery Voltage: 2.77 V
Brady Statistic AP VP Percent: 1.6 %
Brady Statistic AP VS Percent: 1 %
Brady Statistic AS VP Percent: 98 %
Brady Statistic AS VS Percent: 1 %
Brady Statistic RA Percent Paced: 1.4 %
Date Time Interrogation Session: 20241204040036
HighPow Impedance: 49 Ohm
HighPow Impedance: 49 Ohm
Lead Channel Impedance Value: 450 Ohm
Lead Channel Impedance Value: 450 Ohm
Lead Channel Impedance Value: 900 Ohm
Lead Channel Pacing Threshold Amplitude: 0.25 V
Lead Channel Pacing Threshold Amplitude: 0.625 V
Lead Channel Pacing Threshold Amplitude: 2 V
Lead Channel Pacing Threshold Pulse Width: 0.5 ms
Lead Channel Pacing Threshold Pulse Width: 0.5 ms
Lead Channel Pacing Threshold Pulse Width: 1 ms
Lead Channel Sensing Intrinsic Amplitude: 11.9 mV
Lead Channel Sensing Intrinsic Amplitude: 2.1 mV
Lead Channel Setting Pacing Amplitude: 2 V
Lead Channel Setting Pacing Amplitude: 2 V
Lead Channel Setting Pacing Amplitude: 2.5 V
Lead Channel Setting Pacing Pulse Width: 0.5 ms
Lead Channel Setting Pacing Pulse Width: 1 ms
Lead Channel Setting Sensing Sensitivity: 0.5 mV
Pulse Gen Serial Number: 7371595

## 2023-05-18 DIAGNOSIS — J3081 Allergic rhinitis due to animal (cat) (dog) hair and dander: Secondary | ICD-10-CM | POA: Diagnosis not present

## 2023-05-18 DIAGNOSIS — J301 Allergic rhinitis due to pollen: Secondary | ICD-10-CM | POA: Diagnosis not present

## 2023-05-18 DIAGNOSIS — J3089 Other allergic rhinitis: Secondary | ICD-10-CM | POA: Diagnosis not present

## 2023-05-25 DIAGNOSIS — J301 Allergic rhinitis due to pollen: Secondary | ICD-10-CM | POA: Diagnosis not present

## 2023-05-25 DIAGNOSIS — J3081 Allergic rhinitis due to animal (cat) (dog) hair and dander: Secondary | ICD-10-CM | POA: Diagnosis not present

## 2023-05-25 DIAGNOSIS — J3089 Other allergic rhinitis: Secondary | ICD-10-CM | POA: Diagnosis not present

## 2023-05-28 NOTE — Progress Notes (Signed)
Chief Complaint  Patient presents with   Follow-up    CAD   History of Present Illness: 77 yo male with history of CAD s/p 1V CABG in 2007, HTN hyperlipidemia, DM, non-ischemic cardiomyopathy with ICD who is here today for cardiac follow up. He has been followed by Dr. Katrinka Blazing. He has CAD and had 1V CABG in 2007 with LIMA to LAD. He has a non-ischemic cardiomyopathy, BiV-ICD in place. Echo September 2023 with LVEF=50-55%. Mild MR. Stress myoview September 2023 with no large areas of ischemia.   He is here today for follow up. The patient denies any chest pain, dyspnea, palpitations, lower extremity edema, orthopnea, PND, dizziness, near syncope or syncope. He has had limited exercise lately due to knee pain.   Primary Care Physician: Koren Shiver, DO   Past Medical History:  Diagnosis Date   Coronary artery disease    Status post CABG   Diabetes mellitus    HTN (hypertension)    Hyperlipidemia    ICD-St.Jude-CRT    Implanted 2010   Other primary cardiomyopathies    Syncope, vasovagal     Past Surgical History:  Procedure Laterality Date   COLONOSCOPY WITH PROPOFOL N/A 08/30/2021   Procedure: COLONOSCOPY WITH PROPOFOL;  Surgeon: Jeani Hawking, MD;  Location: [facility];  Service: Gastroenterology;  Laterality: N/A;   COLONOSCOPY WITH PROPOFOL N/A 10/17/2022   Procedure: COLONOSCOPY WITH PROPOFOL;  Surgeon: Jeani Hawking, MD;  Location: [facility];  Service: Gastroenterology;  Laterality: N/A;   CORONARY ARTERY BYPASS GRAFT     CORONARY STENT PLACEMENT     KNEE ARTHROPLASTY Left    POLYPECTOMY  08/30/2021   Procedure: POLYPECTOMY;  Surgeon: Jeani Hawking, MD;  Location: [facility];  Service: Gastroenterology;;   POLYPECTOMY  10/17/2022   Procedure: POLYPECTOMY;  Surgeon: Jeani Hawking, MD;  Location: [facility];  Service: Gastroenterology;;    Current Outpatient Medications  Medication Sig Dispense Refill   acetaminophen (TYLENOL) 650 MG CR tablet Take 1,300 mg  by mouth every 8 (eight) hours as needed for pain.     amLODipine (NORVASC) 5 MG tablet Take 1 tablet (5 mg total) by mouth every evening.     aspirin 81 MG EC tablet Take 81 mg by mouth every evening.     atorvastatin (LIPITOR) 40 MG tablet Take 40 mg by mouth every evening.     carvedilol (COREG) 25 MG tablet Take 25 mg by mouth 2 (two) times daily with a meal.     Cholecalciferol 50 MCG (2000 UT) TABS Take 2,000 Units by mouth in the morning.     diphenhydrAMINE (BENADRYL) 25 MG tablet Take 25 mg by mouth every 6 (six) hours as needed for allergies.     empagliflozin (JARDIANCE) 25 MG TABS tablet Take 25 mg by mouth in the morning.     EPINEPHrine 0.3 mg/0.3 mL IJ SOAJ injection Inject 0.3 mg into the muscle as needed (Anaphylaxsis). 2 each 1   Esomeprazole Magnesium 20 MG TBEC Take 20 mg by mouth every other day.     folic acid (FOLVITE) 1 MG tablet Take 1 mg by mouth in the morning.     icosapent Ethyl (VASCEPA) 1 g capsule TAKE 2 CAPSULES (2 G TOTAL) BY MOUTH 2 (TWO) TIMES DAILY. 360 capsule 3   levothyroxine (SYNTHROID, LEVOTHROID) 50 MCG tablet Take 50 mcg by mouth in the morning.     metFORMIN (GLUCOPHAGE) 1000 MG tablet Take 1,000 mg by mouth 2 (two) times daily with a  meal.     Multiple Vitamin (MULTIVITAMIN WITH MINERALS) TABS tablet Take 1 tablet by mouth in the morning.     OZEMPIC, 0.25 OR 0.5 MG/DOSE, 2 MG/1.5ML SOPN Inject 0.5 mg into the skin every Thursday.     pramipexole (MIRAPEX) 0.25 MG tablet Take 0.25 mg by mouth every evening.     losartan (COZAAR) 50 MG tablet Take 50 mg by mouth daily.     No current facility-administered medications for this visit.    Allergies  Allergen Reactions   Shellfish Allergy Hives    Social History   Socioeconomic History   Marital status: Married    Spouse name: Not on file   Number of children: Not on file   Years of education: Not on file   Highest education level: Not on file  Occupational History   Not on file  Tobacco  Use   Smoking status: Former    Current packs/day: 0.00    Types: Cigarettes    Quit date: 40    Years since quitting: 56.0   Smokeless tobacco: Never  Vaping Use   Vaping status: Never Used  Substance and Sexual Activity   Alcohol use: Not Currently   Drug use: Never   Sexual activity: Not on file  Other Topics Concern   Not on file  Social History Narrative   Not on file   Social Drivers of Health   Financial Resource Strain: Not on file  Food Insecurity: Not on file  Transportation Needs: Not on file  Physical Activity: Not on file  Stress: Not on file  Social Connections: Unknown (10/22/2021)   Received from [facility]   Social Network    Social Network: Not on file  Intimate Partner Violence: Unknown (09/13/2021)   Received from [facility], [facility]   HITS    Physically Hurt: Not on file    Insult or Talk Down To: Not on file    Threaten Physical Harm: Not on file    Scream or Curse: Not on file    Family History  Problem Relation Age of Onset   Diabetes Sister    Hyperlipidemia Sister    Heart disease Brother    Hypertension Brother    Hyperlipidemia Brother    Heart disease Brother    Diabetes Brother    Hypertension Brother    Hyperlipidemia Brother    Diabetes Brother    Hypertension Brother    Hyperlipidemia Brother     Review of Systems:  As stated in the HPI and otherwise negative.   BP 134/60 (BP Location: Right Arm, Patient Position: Sitting)   Pulse 75   Ht 5\' 11"  (1.803 m)   Wt 82.1 kg   SpO2 98%   BMI 25.24 kg/m   Physical Examination:  General: Well developed, well nourished, NAD  HEENT: OP clear, mucus membranes moist  SKIN: warm, dry. No rashes. Neuro: No focal deficits  Musculoskeletal: Muscle strength 5/5 all ext  Psychiatric: Mood and affect normal  Neck: No JVD, no carotid bruits, no thyromegaly, no lymphadenopathy.  Lungs:Clear bilaterally, no wheezes, rhonci, crackles Cardiovascular: Regular  rate and rhythm. No murmurs, gallops or rubs. Abdomen:Soft. Bowel sounds present. Non-tender.  Extremities: No lower extremity edema. Pulses are 2 + in the bilateral DP/PT.  EKG:  EKG is not ordered today. The ekg ordered today demonstrates   Echo September 2023: 1. Small linear density seen on the pacemaker lead in the RA. Suspect  this represents thrombus  which commonly is seen on these leads. If there  are clinical concerns for endocarditis would check blood cultures and  obtain a TEE. Best seen on image 58.   2. Left ventricular ejection fraction, by estimation, is 50 to 55%. The  left ventricle has low normal function. The left ventricle has no regional  wall motion abnormalities. Left ventricular diastolic parameters are  consistent with Grade I diastolic  dysfunction (impaired relaxation).   3. Right ventricular systolic function is normal. The right ventricular  size is mildly enlarged. There is normal pulmonary artery systolic  pressure. The estimated right ventricular systolic pressure is 33.5 mmHg.   4. The mitral valve is grossly normal. Mild mitral valve regurgitation.  No evidence of mitral stenosis.   5. The aortic valve is tricuspid. Aortic valve regurgitation is not  visualized. No aortic stenosis is present.   6. The inferior vena cava is normal in size with greater than 50%  respiratory variability, suggesting right atrial pressure of 3 mmHg.   Recent Labs: 12/01/2022: B Natriuretic Peptide 17.4 12/03/2022: ALT 13; BUN 25; Creatinine, Ser 0.86; Hemoglobin 12.7; Platelets 128; Potassium 4.2; Sodium 136   Lipid Panel    Component Value Date/Time   CHOL 132 04/18/2019 0757   TRIG 244 (H) 04/18/2019 0757   HDL 35 (L) 04/18/2019 0757   CHOLHDL 3.8 04/18/2019 0757   LDLCALC 58 04/18/2019 0757     Wt Readings from Last 3 Encounters:  05/29/23 82.1 kg  12/02/22 79.8 kg  12/01/22 81.7 kg    Assessment and Plan:   1. CAD s/p CABG without angina: No chest pain.  Continue ASA, statin and beta blocker.   2. Non-ischemic cardiomyopathy/Chronic diastolic CHF: No volume overload. Continue Coreg, Losartan and Jardiance. ICD in place and followed by Dr. Graciela Husbands.   3. HTN: BP is controlled. Continue current therapy  4. Hyperlipidemia: Lipids followed in primary care. LDL 41 in August 2024. Continue statin.   Labs/ tests ordered today include:  No orders of the defined types were placed in this encounter.  Disposition:   F/U with me in 6 months   Signed, Verne Carrow, MD, [facility] 05/29/2023 10:45 AM    [facility] [address] [place], [place], [place]  [address] Phone: [phone]; Fax: [phone]

## 2023-05-29 ENCOUNTER — Ambulatory Visit: Payer: Medicare Other | Attending: Cardiovascular Disease | Admitting: Cardiovascular Disease

## 2023-05-29 ENCOUNTER — Encounter: Payer: Self-pay | Admitting: Cardiovascular Disease

## 2023-05-29 VITALS — BP 134/60 | HR 75 | Ht 71.0 in | Wt 181.0 lb

## 2023-05-29 DIAGNOSIS — I10 Essential (primary) hypertension: Secondary | ICD-10-CM | POA: Insufficient documentation

## 2023-05-29 DIAGNOSIS — I2510 Atherosclerotic heart disease of native coronary artery without angina pectoris: Secondary | ICD-10-CM | POA: Insufficient documentation

## 2023-05-29 DIAGNOSIS — I5032 Chronic diastolic (congestive) heart failure: Secondary | ICD-10-CM | POA: Insufficient documentation

## 2023-05-29 DIAGNOSIS — I428 Other cardiomyopathies: Secondary | ICD-10-CM | POA: Insufficient documentation

## 2023-05-29 DIAGNOSIS — E7849 Other hyperlipidemia: Secondary | ICD-10-CM | POA: Insufficient documentation

## 2023-05-29 NOTE — Patient Instructions (Signed)
Medication Instructions:  No changes *If you need a refill on your cardiac medications before your next appointment, please call your pharmacy*   Lab Work: none If you have labs (blood work) drawn today and your tests are completely normal, you will receive your results only by: MyChart Message (if you have MyChart) OR A paper copy in the mail If you have any lab test that is abnormal or we need to change your treatment, we will call you to review the results.   Testing/Procedures: none   Follow-Up: At [facility], you and your health needs are our priority.  As part of our continuing mission to provide you with exceptional heart care, we have created designated Provider Care Teams.  These Care Teams include your primary Cardiologist (physician) and Advanced Practice Providers (APPs -  Physician Assistants and Nurse Practitioners) who all work together to provide you with the care you need, when you need it.  We recommend signing up for the patient portal called "MyChart".  Sign up information is provided on this After Visit Summary.  MyChart is used to connect with patients for Virtual Visits (Telemedicine).  Patients are able to view lab/test results, encounter notes, upcoming appointments, etc.  Non-urgent messages can be sent to your provider as well.   To learn more about what you can do with MyChart, go to [email or web address].    Your next appointment:   6 month(s)  Provider:   Verne Carrow, MD     Other Instructions

## 2023-06-08 DIAGNOSIS — J301 Allergic rhinitis due to pollen: Secondary | ICD-10-CM | POA: Diagnosis not present

## 2023-06-08 DIAGNOSIS — J3081 Allergic rhinitis due to animal (cat) (dog) hair and dander: Secondary | ICD-10-CM | POA: Diagnosis not present

## 2023-06-08 DIAGNOSIS — J3089 Other allergic rhinitis: Secondary | ICD-10-CM | POA: Diagnosis not present

## 2023-06-15 DIAGNOSIS — J3081 Allergic rhinitis due to animal (cat) (dog) hair and dander: Secondary | ICD-10-CM | POA: Diagnosis not present

## 2023-06-15 DIAGNOSIS — J301 Allergic rhinitis due to pollen: Secondary | ICD-10-CM | POA: Diagnosis not present

## 2023-06-15 DIAGNOSIS — J3089 Other allergic rhinitis: Secondary | ICD-10-CM | POA: Diagnosis not present

## 2023-06-22 DIAGNOSIS — J3089 Other allergic rhinitis: Secondary | ICD-10-CM | POA: Diagnosis not present

## 2023-06-22 DIAGNOSIS — J301 Allergic rhinitis due to pollen: Secondary | ICD-10-CM | POA: Diagnosis not present

## 2023-06-22 DIAGNOSIS — J3081 Allergic rhinitis due to animal (cat) (dog) hair and dander: Secondary | ICD-10-CM | POA: Diagnosis not present

## 2023-06-30 DIAGNOSIS — J3081 Allergic rhinitis due to animal (cat) (dog) hair and dander: Secondary | ICD-10-CM | POA: Diagnosis not present

## 2023-06-30 DIAGNOSIS — J3089 Other allergic rhinitis: Secondary | ICD-10-CM | POA: Diagnosis not present

## 2023-06-30 DIAGNOSIS — J301 Allergic rhinitis due to pollen: Secondary | ICD-10-CM | POA: Diagnosis not present

## 2023-07-06 DIAGNOSIS — J301 Allergic rhinitis due to pollen: Secondary | ICD-10-CM | POA: Diagnosis not present

## 2023-07-06 DIAGNOSIS — J3081 Allergic rhinitis due to animal (cat) (dog) hair and dander: Secondary | ICD-10-CM | POA: Diagnosis not present

## 2023-07-06 DIAGNOSIS — J3089 Other allergic rhinitis: Secondary | ICD-10-CM | POA: Diagnosis not present

## 2023-07-13 DIAGNOSIS — J3081 Allergic rhinitis due to animal (cat) (dog) hair and dander: Secondary | ICD-10-CM | POA: Diagnosis not present

## 2023-07-13 DIAGNOSIS — J301 Allergic rhinitis due to pollen: Secondary | ICD-10-CM | POA: Diagnosis not present

## 2023-07-13 DIAGNOSIS — J3089 Other allergic rhinitis: Secondary | ICD-10-CM | POA: Diagnosis not present

## 2023-07-20 DIAGNOSIS — J301 Allergic rhinitis due to pollen: Secondary | ICD-10-CM | POA: Diagnosis not present

## 2023-07-20 DIAGNOSIS — J3081 Allergic rhinitis due to animal (cat) (dog) hair and dander: Secondary | ICD-10-CM | POA: Diagnosis not present

## 2023-07-20 DIAGNOSIS — J3089 Other allergic rhinitis: Secondary | ICD-10-CM | POA: Diagnosis not present

## 2023-07-27 DIAGNOSIS — J3081 Allergic rhinitis due to animal (cat) (dog) hair and dander: Secondary | ICD-10-CM | POA: Diagnosis not present

## 2023-07-27 DIAGNOSIS — J3089 Other allergic rhinitis: Secondary | ICD-10-CM | POA: Diagnosis not present

## 2023-07-27 DIAGNOSIS — J301 Allergic rhinitis due to pollen: Secondary | ICD-10-CM | POA: Diagnosis not present

## 2023-08-03 DIAGNOSIS — J3089 Other allergic rhinitis: Secondary | ICD-10-CM | POA: Diagnosis not present

## 2023-08-03 DIAGNOSIS — J3081 Allergic rhinitis due to animal (cat) (dog) hair and dander: Secondary | ICD-10-CM | POA: Diagnosis not present

## 2023-08-03 DIAGNOSIS — J301 Allergic rhinitis due to pollen: Secondary | ICD-10-CM | POA: Diagnosis not present

## 2023-08-10 DIAGNOSIS — J3081 Allergic rhinitis due to animal (cat) (dog) hair and dander: Secondary | ICD-10-CM | POA: Diagnosis not present

## 2023-08-10 DIAGNOSIS — J3089 Other allergic rhinitis: Secondary | ICD-10-CM | POA: Diagnosis not present

## 2023-08-10 DIAGNOSIS — J301 Allergic rhinitis due to pollen: Secondary | ICD-10-CM | POA: Diagnosis not present

## 2023-08-11 DIAGNOSIS — J3081 Allergic rhinitis due to animal (cat) (dog) hair and dander: Secondary | ICD-10-CM | POA: Diagnosis not present

## 2023-08-11 DIAGNOSIS — J301 Allergic rhinitis due to pollen: Secondary | ICD-10-CM | POA: Diagnosis not present

## 2023-08-11 DIAGNOSIS — J3089 Other allergic rhinitis: Secondary | ICD-10-CM | POA: Diagnosis not present

## 2023-08-12 ENCOUNTER — Ambulatory Visit: Payer: Medicare Other

## 2023-08-12 DIAGNOSIS — I428 Other cardiomyopathies: Secondary | ICD-10-CM

## 2023-08-15 LAB — CUP PACEART REMOTE DEVICE CHECK
Battery Remaining Longevity: 12 mo
Battery Remaining Percentage: 14 %
Battery Voltage: 2.72 V
Brady Statistic AP VP Percent: 1.4 %
Brady Statistic AP VS Percent: 1 %
Brady Statistic AS VP Percent: 98 %
Brady Statistic AS VS Percent: 1 %
Brady Statistic RA Percent Paced: 1.3 %
Date Time Interrogation Session: 20250305040016
HighPow Impedance: 49 Ohm
HighPow Impedance: 50 Ohm
Lead Channel Impedance Value: 410 Ohm
Lead Channel Impedance Value: 440 Ohm
Lead Channel Impedance Value: 830 Ohm
Lead Channel Pacing Threshold Amplitude: 0.25 V
Lead Channel Pacing Threshold Amplitude: 0.625 V
Lead Channel Pacing Threshold Amplitude: 2 V
Lead Channel Pacing Threshold Pulse Width: 0.5 ms
Lead Channel Pacing Threshold Pulse Width: 0.5 ms
Lead Channel Pacing Threshold Pulse Width: 1 ms
Lead Channel Sensing Intrinsic Amplitude: 1.9 mV
Lead Channel Sensing Intrinsic Amplitude: 11.9 mV
Lead Channel Setting Pacing Amplitude: 2 V
Lead Channel Setting Pacing Amplitude: 2 V
Lead Channel Setting Pacing Amplitude: 2.5 V
Lead Channel Setting Pacing Pulse Width: 0.5 ms
Lead Channel Setting Pacing Pulse Width: 1 ms
Lead Channel Setting Sensing Sensitivity: 0.5 mV
Pulse Gen Serial Number: 7371595

## 2023-08-17 DIAGNOSIS — J3089 Other allergic rhinitis: Secondary | ICD-10-CM | POA: Diagnosis not present

## 2023-08-17 DIAGNOSIS — J301 Allergic rhinitis due to pollen: Secondary | ICD-10-CM | POA: Diagnosis not present

## 2023-08-17 DIAGNOSIS — J3081 Allergic rhinitis due to animal (cat) (dog) hair and dander: Secondary | ICD-10-CM | POA: Diagnosis not present

## 2023-08-24 DIAGNOSIS — J3089 Other allergic rhinitis: Secondary | ICD-10-CM | POA: Diagnosis not present

## 2023-08-24 DIAGNOSIS — J3081 Allergic rhinitis due to animal (cat) (dog) hair and dander: Secondary | ICD-10-CM | POA: Diagnosis not present

## 2023-08-24 DIAGNOSIS — J301 Allergic rhinitis due to pollen: Secondary | ICD-10-CM | POA: Diagnosis not present

## 2023-08-31 DIAGNOSIS — J301 Allergic rhinitis due to pollen: Secondary | ICD-10-CM | POA: Diagnosis not present

## 2023-08-31 DIAGNOSIS — J3089 Other allergic rhinitis: Secondary | ICD-10-CM | POA: Diagnosis not present

## 2023-08-31 DIAGNOSIS — J3081 Allergic rhinitis due to animal (cat) (dog) hair and dander: Secondary | ICD-10-CM | POA: Diagnosis not present

## 2023-09-07 DIAGNOSIS — E1159 Type 2 diabetes mellitus with other circulatory complications: Secondary | ICD-10-CM | POA: Diagnosis not present

## 2023-09-07 DIAGNOSIS — J3081 Allergic rhinitis due to animal (cat) (dog) hair and dander: Secondary | ICD-10-CM | POA: Diagnosis not present

## 2023-09-07 DIAGNOSIS — J301 Allergic rhinitis due to pollen: Secondary | ICD-10-CM | POA: Diagnosis not present

## 2023-09-07 DIAGNOSIS — J3089 Other allergic rhinitis: Secondary | ICD-10-CM | POA: Diagnosis not present

## 2023-09-14 DIAGNOSIS — J3089 Other allergic rhinitis: Secondary | ICD-10-CM | POA: Diagnosis not present

## 2023-09-14 DIAGNOSIS — J3081 Allergic rhinitis due to animal (cat) (dog) hair and dander: Secondary | ICD-10-CM | POA: Diagnosis not present

## 2023-09-14 DIAGNOSIS — J301 Allergic rhinitis due to pollen: Secondary | ICD-10-CM | POA: Diagnosis not present

## 2023-09-17 DIAGNOSIS — J301 Allergic rhinitis due to pollen: Secondary | ICD-10-CM | POA: Diagnosis not present

## 2023-09-17 DIAGNOSIS — L500 Allergic urticaria: Secondary | ICD-10-CM | POA: Diagnosis not present

## 2023-09-17 DIAGNOSIS — J3081 Allergic rhinitis due to animal (cat) (dog) hair and dander: Secondary | ICD-10-CM | POA: Diagnosis not present

## 2023-09-17 DIAGNOSIS — J3089 Other allergic rhinitis: Secondary | ICD-10-CM | POA: Diagnosis not present

## 2023-09-17 DIAGNOSIS — Z91018 Allergy to other foods: Secondary | ICD-10-CM | POA: Diagnosis not present

## 2023-09-21 ENCOUNTER — Encounter: Payer: Self-pay | Admitting: Internal Medicine

## 2023-09-21 DIAGNOSIS — J301 Allergic rhinitis due to pollen: Secondary | ICD-10-CM | POA: Diagnosis not present

## 2023-09-21 DIAGNOSIS — J3089 Other allergic rhinitis: Secondary | ICD-10-CM | POA: Diagnosis not present

## 2023-09-21 DIAGNOSIS — J3081 Allergic rhinitis due to animal (cat) (dog) hair and dander: Secondary | ICD-10-CM | POA: Diagnosis not present

## 2023-09-24 NOTE — Progress Notes (Signed)
 Remote ICD transmission.

## 2023-09-24 NOTE — Addendum Note (Signed)
 Addended by: Lott Rouleau A on: 09/24/2023 03:07 PM   Modules accepted: Orders

## 2023-09-30 DIAGNOSIS — J3089 Other allergic rhinitis: Secondary | ICD-10-CM | POA: Diagnosis not present

## 2023-09-30 DIAGNOSIS — J301 Allergic rhinitis due to pollen: Secondary | ICD-10-CM | POA: Diagnosis not present

## 2023-09-30 DIAGNOSIS — J3081 Allergic rhinitis due to animal (cat) (dog) hair and dander: Secondary | ICD-10-CM | POA: Diagnosis not present

## 2023-10-05 DIAGNOSIS — J3081 Allergic rhinitis due to animal (cat) (dog) hair and dander: Secondary | ICD-10-CM | POA: Diagnosis not present

## 2023-10-05 DIAGNOSIS — J3089 Other allergic rhinitis: Secondary | ICD-10-CM | POA: Diagnosis not present

## 2023-10-05 DIAGNOSIS — J301 Allergic rhinitis due to pollen: Secondary | ICD-10-CM | POA: Diagnosis not present

## 2023-10-14 DIAGNOSIS — H2513 Age-related nuclear cataract, bilateral: Secondary | ICD-10-CM | POA: Diagnosis not present

## 2023-10-14 DIAGNOSIS — H5213 Myopia, bilateral: Secondary | ICD-10-CM | POA: Diagnosis not present

## 2023-10-14 DIAGNOSIS — E119 Type 2 diabetes mellitus without complications: Secondary | ICD-10-CM | POA: Diagnosis not present

## 2023-10-19 DIAGNOSIS — J3089 Other allergic rhinitis: Secondary | ICD-10-CM | POA: Diagnosis not present

## 2023-10-19 DIAGNOSIS — J3081 Allergic rhinitis due to animal (cat) (dog) hair and dander: Secondary | ICD-10-CM | POA: Diagnosis not present

## 2023-10-19 DIAGNOSIS — J301 Allergic rhinitis due to pollen: Secondary | ICD-10-CM | POA: Diagnosis not present

## 2023-11-03 DIAGNOSIS — J3081 Allergic rhinitis due to animal (cat) (dog) hair and dander: Secondary | ICD-10-CM | POA: Diagnosis not present

## 2023-11-03 DIAGNOSIS — J301 Allergic rhinitis due to pollen: Secondary | ICD-10-CM | POA: Diagnosis not present

## 2023-11-03 DIAGNOSIS — J3089 Other allergic rhinitis: Secondary | ICD-10-CM | POA: Diagnosis not present

## 2023-11-09 ENCOUNTER — Encounter: Payer: Self-pay | Admitting: Cardiovascular Disease

## 2023-11-09 ENCOUNTER — Ambulatory Visit: Payer: Medicare Other | Attending: Cardiovascular Disease | Admitting: Cardiovascular Disease

## 2023-11-09 VITALS — BP 136/72 | HR 76 | Ht 71.0 in | Wt 186.6 lb

## 2023-11-09 DIAGNOSIS — I10 Essential (primary) hypertension: Secondary | ICD-10-CM | POA: Diagnosis not present

## 2023-11-09 DIAGNOSIS — I2510 Atherosclerotic heart disease of native coronary artery without angina pectoris: Secondary | ICD-10-CM | POA: Diagnosis not present

## 2023-11-09 DIAGNOSIS — E7849 Other hyperlipidemia: Secondary | ICD-10-CM | POA: Diagnosis not present

## 2023-11-09 DIAGNOSIS — I5032 Chronic diastolic (congestive) heart failure: Secondary | ICD-10-CM

## 2023-11-09 DIAGNOSIS — E1159 Type 2 diabetes mellitus with other circulatory complications: Secondary | ICD-10-CM | POA: Diagnosis not present

## 2023-11-09 DIAGNOSIS — I428 Other cardiomyopathies: Secondary | ICD-10-CM | POA: Diagnosis not present

## 2023-11-09 NOTE — Progress Notes (Signed)
 Chief Complaint  Patient presents with   Follow-up    CAD, cardiomyopathy   History of Present Illness: 78 yo male with history of CAD s/p 1V CABG in 2007, HTN hyperlipidemia, DM, non-ischemic cardiomyopathy with ICD who is here today for cardiac follow up. He has been followed by Dr. Felipe Horton. He has CAD and had 1V CABG in 2007 with LIMA to LAD. He has a non-ischemic cardiomyopathy, BiV-ICD in place. Echo September 2023 with LVEF=50-55%. Mild MR. Stress myoview  September 2023 with no large areas of ischemia.   He is here today for follow up. The patient denies any chest pain, dyspnea, palpitations, lower extremity edema, orthopnea, PND, dizziness, near syncope or syncope.   Primary Care Physician: Ruven Coy, MD   Past Medical History:  Diagnosis Date   Coronary artery disease    Status post CABG   Diabetes mellitus    HTN (hypertension)    Hyperlipidemia    ICD-St.Jude-CRT    Implanted 2010   Other primary cardiomyopathies    Syncope, vasovagal     Past Surgical History:  Procedure Laterality Date   COLONOSCOPY WITH PROPOFOL  N/A 08/30/2021   Procedure: COLONOSCOPY WITH PROPOFOL ;  Surgeon: Alvis Jourdain, MD;  Location: [facility];  Service: Gastroenterology;  Laterality: N/A;   COLONOSCOPY WITH PROPOFOL  N/A 10/17/2022   Procedure: COLONOSCOPY WITH PROPOFOL ;  Surgeon: Alvis Jourdain, MD;  Location: [facility];  Service: Gastroenterology;  Laterality: N/A;   CORONARY ARTERY BYPASS GRAFT     CORONARY STENT PLACEMENT     KNEE ARTHROPLASTY Left    POLYPECTOMY  08/30/2021   Procedure: POLYPECTOMY;  Surgeon: Alvis Jourdain, MD;  Location: [facility];  Service: Gastroenterology;;   POLYPECTOMY  10/17/2022   Procedure: POLYPECTOMY;  Surgeon: Alvis Jourdain, MD;  Location: [facility];  Service: Gastroenterology;;    Current Outpatient Medications  Medication Sig Dispense Refill   acetaminophen  (TYLENOL ) 650 MG CR tablet Take 1,300 mg by mouth every 8 (eight) hours as needed  for pain.     amLODipine  (NORVASC ) 5 MG tablet Take 1 tablet (5 mg total) by mouth every evening.     aspirin  81 MG EC tablet Take 81 mg by mouth every evening.     atorvastatin  (LIPITOR) 40 MG tablet Take 40 mg by mouth every evening.     carvedilol  (COREG ) 25 MG tablet Take 25 mg by mouth 2 (two) times daily with a meal.     Cholecalciferol 50 MCG (2000 UT) TABS Take 2,000 Units by mouth in the morning.     diphenhydrAMINE  (BENADRYL ) 25 MG tablet Take 25 mg by mouth every 6 (six) hours as needed for allergies.     empagliflozin (JARDIANCE) 25 MG TABS tablet Take 25 mg by mouth in the morning.     EPINEPHrine  0.3 mg/0.3 mL IJ SOAJ injection Inject 0.3 mg into the muscle as needed (Anaphylaxsis). 2 each 1   Esomeprazole  Magnesium  20 MG TBEC Take 20 mg by mouth every other day.     folic acid (FOLVITE) 1 MG tablet Take 1 mg by mouth in the morning.     icosapent  Ethyl (VASCEPA ) 1 g capsule TAKE 2 CAPSULES (2 G TOTAL) BY MOUTH 2 (TWO) TIMES DAILY. 360 capsule 3   levothyroxine  (SYNTHROID , LEVOTHROID) 50 MCG tablet Take 50 mcg by mouth in the morning.     losartan  (COZAAR ) 50 MG tablet Take 50 mg by mouth daily.     metFORMIN (GLUCOPHAGE) 1000 MG tablet Take 1,000 mg by mouth 2 (  two) times daily with a meal.     Multiple Vitamin (MULTIVITAMIN WITH MINERALS) TABS tablet Take 1 tablet by mouth in the morning.     OZEMPIC, 0.25 OR 0.5 MG/DOSE, 2 MG/1.5ML SOPN Inject 0.5 mg into the skin every Thursday.     pramipexole  (MIRAPEX ) 0.25 MG tablet Take 0.25 mg by mouth every evening.     No current facility-administered medications for this visit.    Allergies  Allergen Reactions   Shellfish Allergy Hives    Social History   Socioeconomic History   Marital status: Married    Spouse name: Not on file   Number of children: Not on file   Years of education: Not on file   Highest education level: Not on file  Occupational History   Not on file  Tobacco Use   Smoking status: Former    Current  packs/day: 0.00    Types: Cigarettes    Quit date: 58    Years since quitting: 56.4   Smokeless tobacco: Never  Vaping Use   Vaping status: Never Used  Substance and Sexual Activity   Alcohol use: Not Currently   Drug use: Never   Sexual activity: Not on file  Other Topics Concern   Not on file  Social History Narrative   Not on file   Social Drivers of Health   Financial Resource Strain: Not on file  Food Insecurity: Not on file  Transportation Needs: Not on file  Physical Activity: Not on file  Stress: Not on file  Social Connections: Unknown (10/22/2021)   Received from [facility]   Social Network    Social Network: Not on file  Intimate Partner Violence: Unknown (09/13/2021)   Received from [facility], [facility]   HITS    Physically Hurt: Not on file    Insult or Talk Down To: Not on file    Threaten Physical Harm: Not on file    Scream or Curse: Not on file    Family History  Problem Relation Age of Onset   Diabetes Sister    Hyperlipidemia Sister    Heart disease Brother    Hypertension Brother    Hyperlipidemia Brother    Heart disease Brother    Diabetes Brother    Hypertension Brother    Hyperlipidemia Brother    Diabetes Brother    Hypertension Brother    Hyperlipidemia Brother     Review of Systems:  As stated in the HPI and otherwise negative.   BP 136/72   Pulse 76   Ht 5\' 11"  (1.803 m)   Wt 186 lb 9.6 oz (84.6 kg)   SpO2 97%   BMI 26.03 kg/m   Physical Examination:  General: Well developed, well nourished, NAD  HEENT: OP clear, mucus membranes moist  SKIN: warm, dry. No rashes. Neuro: No focal deficits  Musculoskeletal: Muscle strength 5/5 all ext  Psychiatric: Mood and affect normal  Neck: No JVD, no carotid bruits, no thyromegaly, no lymphadenopathy.  Lungs:Clear bilaterally, no wheezes, rhonci, crackles Cardiovascular: Regular rate and rhythm. No murmurs, gallops or rubs. Abdomen:Soft. Bowel sounds  present. Non-tender.  Extremities: No lower extremity edema. Pulses are 2 + in the bilateral DP/PT.  EKG:  EKG is ordered today. The ekg ordered today demonstrates  EKG Interpretation Date/Time:  Monday November 09 2023 10:34:35 EDT Ventricular Rate:  73 PR Interval:  134 QRS Duration:  156 QT Interval:  450 QTC Calculation: 495 R Axis:   167  Text Interpretation: Atrial-sensed ventricular-paced rhythm Biventricular pacemaker detected When compared with ECG of 01-Dec-2022 23:54, PREVIOUS ECG IS PRESENT Confirmed by Antoinette Batman [phone]) on 11/09/2023 10:42:33 AM    Recent Labs: 12/01/2022: B Natriuretic Peptide 17.4 12/03/2022: ALT 13; BUN 25; Creatinine, Ser 0.86; Hemoglobin 12.7; Platelets 128; Potassium 4.2; Sodium 136   Lipid Panel    Component Value Date/Time   CHOL 132 04/18/2019 0757   TRIG 244 (H) 04/18/2019 0757   HDL 35 (L) 04/18/2019 0757   CHOLHDL 3.8 04/18/2019 0757   LDLCALC 58 04/18/2019 0757     Wt Readings from Last 3 Encounters:  11/09/23 186 lb 9.6 oz (84.6 kg)  05/29/23 181 lb (82.1 kg)  12/02/22 175 lb 14.8 oz (79.8 kg)    Assessment and Plan:   1. CAD s/p CABG without angina: No chest pain. Continue ASA, Coreg  and statin.   2. Non-ischemic cardiomyopathy/Chronic diastolic CHF: No volume overload. Wt is stable. Continue Coreg , Losartan  and Jardiance. ICD in place and followed by Dr. Rodolfo Clan.   3. HTN: BP is well controlled. No changes.   4. Hyperlipidemia: Lipids followed in primary care. LDL 41 in August 2024. Continue statin.   Labs/ tests ordered today include:   Orders Placed This Encounter  Procedures   EKG 12-Lead   Disposition:   F/U with me in 12 months   Signed, Antoinette Batman, MD, [facility] 11/09/2023 11:27 AM    [facility] [address] [place], [place], [place]  [address] Phone: [phone]; Fax: [phone]

## 2023-11-09 NOTE — Patient Instructions (Signed)
 OVERDUE FOR EP VISIT ---PLEASE SCHEDULE WITH EP PA-C FIRST AVAILABLE APPOINTMENT   Medication Instructions:  No changes *If you need a refill on your cardiac medications before your next appointment, please call your pharmacy*  Lab Work: none  Testing/Procedures: None  Follow-Up: At [facility], you and your health needs are our priority.  As part of our continuing mission to provide you with exceptional heart care, our providers are all part of one team.  This team includes your primary Cardiologist (physician) and Advanced Practice Providers or APPs (Physician Assistants and Nurse Practitioners) who all work together to provide you with the care you need, when you need it.  Your next appointment:   12 month(s)  Provider:   Antoinette Batman, MD     Other Instructions

## 2023-11-10 DIAGNOSIS — Z79899 Other long term (current) drug therapy: Secondary | ICD-10-CM | POA: Diagnosis not present

## 2023-11-11 ENCOUNTER — Ambulatory Visit (INDEPENDENT_AMBULATORY_CARE_PROVIDER_SITE_OTHER): Payer: Medicare Other

## 2023-11-11 DIAGNOSIS — I428 Other cardiomyopathies: Secondary | ICD-10-CM | POA: Diagnosis not present

## 2023-11-11 LAB — CUP PACEART REMOTE DEVICE CHECK
Battery Remaining Longevity: 7 mo
Battery Remaining Percentage: 9 %
Battery Voltage: 2.66 V
Brady Statistic AP VP Percent: 1.4 %
Brady Statistic AP VS Percent: 1 %
Brady Statistic AS VP Percent: 98 %
Brady Statistic AS VS Percent: 1 %
Brady Statistic RA Percent Paced: 1.3 %
Date Time Interrogation Session: 20250604040017
HighPow Impedance: 46 Ohm
HighPow Impedance: 46 Ohm
Lead Channel Impedance Value: 430 Ohm
Lead Channel Impedance Value: 440 Ohm
Lead Channel Impedance Value: 840 Ohm
Lead Channel Pacing Threshold Amplitude: 0.25 V
Lead Channel Pacing Threshold Amplitude: 0.625 V
Lead Channel Pacing Threshold Amplitude: 2 V
Lead Channel Pacing Threshold Pulse Width: 0.5 ms
Lead Channel Pacing Threshold Pulse Width: 0.5 ms
Lead Channel Pacing Threshold Pulse Width: 1 ms
Lead Channel Sensing Intrinsic Amplitude: 11.9 mV
Lead Channel Sensing Intrinsic Amplitude: 2.6 mV
Lead Channel Setting Pacing Amplitude: 2 V
Lead Channel Setting Pacing Amplitude: 2 V
Lead Channel Setting Pacing Amplitude: 2.5 V
Lead Channel Setting Pacing Pulse Width: 0.5 ms
Lead Channel Setting Pacing Pulse Width: 1 ms
Lead Channel Setting Sensing Sensitivity: 0.5 mV
Pulse Gen Serial Number: 7371595

## 2023-11-16 DIAGNOSIS — J301 Allergic rhinitis due to pollen: Secondary | ICD-10-CM | POA: Diagnosis not present

## 2023-11-16 DIAGNOSIS — J3089 Other allergic rhinitis: Secondary | ICD-10-CM | POA: Diagnosis not present

## 2023-11-16 DIAGNOSIS — J3081 Allergic rhinitis due to animal (cat) (dog) hair and dander: Secondary | ICD-10-CM | POA: Diagnosis not present

## 2023-11-17 DIAGNOSIS — J3089 Other allergic rhinitis: Secondary | ICD-10-CM | POA: Diagnosis not present

## 2023-11-17 DIAGNOSIS — J301 Allergic rhinitis due to pollen: Secondary | ICD-10-CM | POA: Diagnosis not present

## 2023-11-17 DIAGNOSIS — J3081 Allergic rhinitis due to animal (cat) (dog) hair and dander: Secondary | ICD-10-CM | POA: Diagnosis not present

## 2023-11-18 NOTE — Progress Notes (Signed)
  Electrophysiology Office Note:   Date:  11/19/2023  ID:  KIEFFER BLATZ, DOB Oct 12, 1945, MRN [id number]  Primary Cardiologist: Antoinette Batman, MD Primary Heart Failure: None Electrophysiologist: Richardo Chandler, MD       History of Present Illness:   Todd [name] is a 78 y.o. male with h/o NICM / HFrEF s/p ICD, vasovagal syncope, CAD s/p CABG, HLD seen today for routine electrophysiology followup.   Since last being seen in our clinic the patient reports doing well overall. No device related concerns. He worked for [employer] previously and is very familiar with devices but not so much the leads. He is aware of his atrial lead noise.    He denies chest pain, palpitations, dyspnea, PND, orthopnea, nausea, vomiting, dizziness, syncope, edema, weight gain, or early satiety.   Review of systems complete and found to be negative unless listed in HPI.   EP Information / Studies Reviewed:    EKG is not ordered today. EKG from 11/09/23 reviewed which showed AS VP 73 bpm      ICD Interrogation-  reviewed in detail today,  See PACEART report.  Device History: Abbott BiV ICD implanted 2010 for NICM/HFrEF, Hx of A lead noise, not reproducible on exam / previously occurred when asleep and arm up under pillow.  Generator Change > 05/05/16 History of appropriate therapy: No History of AAD therapy: No   Risk Assessment/Calculations:              Physical Exam:   VS:  BP 100/60   Pulse 77   Ht 5' 10 (1.778 m)   Wt 186 lb (84.4 kg)   SpO2 96%   BMI 26.69 kg/m    Wt Readings from Last 3 Encounters:  11/19/23 186 lb (84.4 kg)  11/09/23 186 lb 9.6 oz (84.6 kg)  05/29/23 181 lb (82.1 kg)     GEN: Well nourished, well developed in no acute distress NECK: No JVD; No carotid bruits CARDIAC: Regular rate and rhythm, no murmurs, rubs, gallops. No tethering.  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 He will meet ERI ~ 7 months from today's visit. He would like to discuss with MD regarding if lead should be revised.  Will plan to bring him back to Dr. Marven Slimmer for consideration of generator change vs generator change and lead revision  Hypertension  -well controlled on current regimen    CAD s/p CABG  -no anginal symptoms   Disposition:   Follow up with Dr. Marven Slimmer (former Dr. Rodolfo Clan pt) 7 months    Signed, Creighton Doffing, NP-C, AGACNP-BC [facility] - Electrophysiology  11/19/2023, 1:39 PM

## 2023-11-19 ENCOUNTER — Encounter: Payer: Self-pay | Admitting: Pulmonary Disease

## 2023-11-19 ENCOUNTER — Ambulatory Visit: Attending: Pulmonary Disease | Admitting: Pulmonary Disease

## 2023-11-19 VITALS — BP 100/60 | HR 77 | Ht 70.0 in | Wt 186.0 lb

## 2023-11-19 DIAGNOSIS — I5022 Chronic systolic (congestive) heart failure: Secondary | ICD-10-CM | POA: Diagnosis not present

## 2023-11-19 DIAGNOSIS — I10 Essential (primary) hypertension: Secondary | ICD-10-CM | POA: Diagnosis not present

## 2023-11-19 DIAGNOSIS — I428 Other cardiomyopathies: Secondary | ICD-10-CM | POA: Diagnosis not present

## 2023-11-19 DIAGNOSIS — I2510 Atherosclerotic heart disease of native coronary artery without angina pectoris: Secondary | ICD-10-CM | POA: Diagnosis not present

## 2023-11-19 LAB — CUP PACEART INCLINIC DEVICE CHECK
Battery Remaining Longevity: 7 mo
Brady Statistic RA Percent Paced: 1.2 %
Brady Statistic RV Percent Paced: 99.74 %
Date Time Interrogation Session: 20250612133205
HighPow Impedance: 45.806
Implantable Lead Connection Status: 753985
Implantable Lead Connection Status: 753985
Implantable Lead Connection Status: 753985
Implantable Lead Implant Date: 20101005
Implantable Lead Implant Date: 20101005
Implantable Lead Implant Date: 20101005
Implantable Lead Location: 753858
Implantable Lead Location: 753859
Implantable Lead Location: 753860
Implantable Lead Model: 1944
Implantable Pulse Generator Implant Date: 20171127
Lead Channel Impedance Value: 425 Ohm
Lead Channel Impedance Value: 450 Ohm
Lead Channel Impedance Value: 862.5 Ohm
Lead Channel Pacing Threshold Amplitude: 0.25 V
Lead Channel Pacing Threshold Amplitude: 0.75 V
Lead Channel Pacing Threshold Amplitude: 0.75 V
Lead Channel Pacing Threshold Amplitude: 2 V
Lead Channel Pacing Threshold Amplitude: 2 V
Lead Channel Pacing Threshold Pulse Width: 0.5 ms
Lead Channel Pacing Threshold Pulse Width: 0.5 ms
Lead Channel Pacing Threshold Pulse Width: 0.5 ms
Lead Channel Pacing Threshold Pulse Width: 1 ms
Lead Channel Pacing Threshold Pulse Width: 1 ms
Lead Channel Sensing Intrinsic Amplitude: 11.9 mV
Lead Channel Sensing Intrinsic Amplitude: 2.8 mV
Lead Channel Setting Pacing Amplitude: 2 V
Lead Channel Setting Pacing Amplitude: 2 V
Lead Channel Setting Pacing Amplitude: 2.5 V
Lead Channel Setting Pacing Pulse Width: 0.5 ms
Lead Channel Setting Pacing Pulse Width: 1 ms
Lead Channel Setting Sensing Sensitivity: 0.5 mV
Pulse Gen Serial Number: 7371595

## 2023-11-19 NOTE — Patient Instructions (Signed)
 Medication Instructions:  Your physician recommends that you continue on your current medications as directed. Please refer to the Current Medication list given to you today. *If you need a refill on your cardiac medications before your next appointment, please call your pharmacy*  Lab Work: None ordered If you have labs (blood work) drawn today and your tests are completely normal, you will receive your results only by: MyChart Message (if you have MyChart) OR A paper copy in the mail If you have any lab test that is abnormal or we need to change your treatment, we will call you to review the results.  Testing/Procedures: None ordered  Follow-Up: At [facility], you and your health needs are our priority.  As part of our continuing mission to provide you with exceptional heart care, our providers are all part of one team.  This team includes your primary Cardiologist (physician) and Advanced Practice Providers or APPs (Physician Assistants and Nurse Practitioners) who all work together to provide you with the care you need, when you need it.  Your next appointment:   7 month(s)  Provider:   Harvie Liner, MD ONLY   We recommend signing up for the patient portal called MyChart.  Sign up information is provided on this After Visit Summary.  MyChart is used to connect with patients for Virtual Visits (Telemedicine).  Patients are able to view lab/test results, encounter notes, upcoming appointments, etc.  Non-urgent messages can be sent to your provider as well.   To learn more about what you can do with MyChart, go to [email or web address].   Other Instructions

## 2023-11-20 ENCOUNTER — Ambulatory Visit: Payer: Self-pay | Admitting: Cardiology

## 2023-11-30 DIAGNOSIS — J3089 Other allergic rhinitis: Secondary | ICD-10-CM | POA: Diagnosis not present

## 2023-11-30 DIAGNOSIS — J3081 Allergic rhinitis due to animal (cat) (dog) hair and dander: Secondary | ICD-10-CM | POA: Diagnosis not present

## 2023-11-30 DIAGNOSIS — J301 Allergic rhinitis due to pollen: Secondary | ICD-10-CM | POA: Diagnosis not present

## 2023-12-14 DIAGNOSIS — J3081 Allergic rhinitis due to animal (cat) (dog) hair and dander: Secondary | ICD-10-CM | POA: Diagnosis not present

## 2023-12-14 DIAGNOSIS — J3089 Other allergic rhinitis: Secondary | ICD-10-CM | POA: Diagnosis not present

## 2023-12-14 DIAGNOSIS — J301 Allergic rhinitis due to pollen: Secondary | ICD-10-CM | POA: Diagnosis not present

## 2023-12-28 DIAGNOSIS — J3081 Allergic rhinitis due to animal (cat) (dog) hair and dander: Secondary | ICD-10-CM | POA: Diagnosis not present

## 2023-12-28 DIAGNOSIS — J301 Allergic rhinitis due to pollen: Secondary | ICD-10-CM | POA: Diagnosis not present

## 2023-12-28 DIAGNOSIS — J3089 Other allergic rhinitis: Secondary | ICD-10-CM | POA: Diagnosis not present

## 2023-12-28 DIAGNOSIS — Z860100 Personal history of colon polyps, unspecified: Secondary | ICD-10-CM | POA: Diagnosis not present

## 2023-12-28 DIAGNOSIS — K5730 Diverticulosis of large intestine without perforation or abscess without bleeding: Secondary | ICD-10-CM | POA: Diagnosis not present

## 2023-12-28 DIAGNOSIS — K625 Hemorrhage of anus and rectum: Secondary | ICD-10-CM | POA: Diagnosis not present

## 2023-12-31 NOTE — Progress Notes (Signed)
 Remote ICD transmission.

## 2024-01-06 DIAGNOSIS — G4733 Obstructive sleep apnea (adult) (pediatric): Secondary | ICD-10-CM | POA: Diagnosis not present

## 2024-01-11 DIAGNOSIS — J301 Allergic rhinitis due to pollen: Secondary | ICD-10-CM | POA: Diagnosis not present

## 2024-01-11 DIAGNOSIS — J3089 Other allergic rhinitis: Secondary | ICD-10-CM | POA: Diagnosis not present

## 2024-01-11 DIAGNOSIS — J3081 Allergic rhinitis due to animal (cat) (dog) hair and dander: Secondary | ICD-10-CM | POA: Diagnosis not present

## 2024-01-25 DIAGNOSIS — J301 Allergic rhinitis due to pollen: Secondary | ICD-10-CM | POA: Diagnosis not present

## 2024-01-25 DIAGNOSIS — J3089 Other allergic rhinitis: Secondary | ICD-10-CM | POA: Diagnosis not present

## 2024-01-25 DIAGNOSIS — J3081 Allergic rhinitis due to animal (cat) (dog) hair and dander: Secondary | ICD-10-CM | POA: Diagnosis not present

## 2024-01-26 DIAGNOSIS — Z95810 Presence of automatic (implantable) cardiac defibrillator: Secondary | ICD-10-CM | POA: Diagnosis not present

## 2024-01-26 DIAGNOSIS — I2510 Atherosclerotic heart disease of native coronary artery without angina pectoris: Secondary | ICD-10-CM | POA: Diagnosis not present

## 2024-01-26 DIAGNOSIS — Z0000 Encounter for general adult medical examination without abnormal findings: Secondary | ICD-10-CM | POA: Diagnosis not present

## 2024-01-26 DIAGNOSIS — E039 Hypothyroidism, unspecified: Secondary | ICD-10-CM | POA: Diagnosis not present

## 2024-01-26 DIAGNOSIS — E559 Vitamin D deficiency, unspecified: Secondary | ICD-10-CM | POA: Diagnosis not present

## 2024-01-26 DIAGNOSIS — E291 Testicular hypofunction: Secondary | ICD-10-CM | POA: Diagnosis not present

## 2024-01-26 DIAGNOSIS — E782 Mixed hyperlipidemia: Secondary | ICD-10-CM | POA: Diagnosis not present

## 2024-01-26 DIAGNOSIS — E119 Type 2 diabetes mellitus without complications: Secondary | ICD-10-CM | POA: Diagnosis not present

## 2024-01-26 DIAGNOSIS — I509 Heart failure, unspecified: Secondary | ICD-10-CM | POA: Diagnosis not present

## 2024-01-26 DIAGNOSIS — I10 Essential (primary) hypertension: Secondary | ICD-10-CM | POA: Diagnosis not present

## 2024-01-26 DIAGNOSIS — N400 Enlarged prostate without lower urinary tract symptoms: Secondary | ICD-10-CM | POA: Diagnosis not present

## 2024-01-26 DIAGNOSIS — K635 Polyp of colon: Secondary | ICD-10-CM | POA: Diagnosis not present

## 2024-01-27 DIAGNOSIS — E291 Testicular hypofunction: Secondary | ICD-10-CM | POA: Diagnosis not present

## 2024-01-27 DIAGNOSIS — N400 Enlarged prostate without lower urinary tract symptoms: Secondary | ICD-10-CM | POA: Diagnosis not present

## 2024-01-27 DIAGNOSIS — I10 Essential (primary) hypertension: Secondary | ICD-10-CM | POA: Diagnosis not present

## 2024-01-27 DIAGNOSIS — E782 Mixed hyperlipidemia: Secondary | ICD-10-CM | POA: Diagnosis not present

## 2024-01-27 DIAGNOSIS — E559 Vitamin D deficiency, unspecified: Secondary | ICD-10-CM | POA: Diagnosis not present

## 2024-01-27 DIAGNOSIS — E119 Type 2 diabetes mellitus without complications: Secondary | ICD-10-CM | POA: Diagnosis not present

## 2024-01-27 DIAGNOSIS — E039 Hypothyroidism, unspecified: Secondary | ICD-10-CM | POA: Diagnosis not present

## 2024-02-02 DIAGNOSIS — E291 Testicular hypofunction: Secondary | ICD-10-CM | POA: Diagnosis not present

## 2024-02-09 DIAGNOSIS — J301 Allergic rhinitis due to pollen: Secondary | ICD-10-CM | POA: Diagnosis not present

## 2024-02-09 DIAGNOSIS — J3089 Other allergic rhinitis: Secondary | ICD-10-CM | POA: Diagnosis not present

## 2024-02-09 DIAGNOSIS — J3081 Allergic rhinitis due to animal (cat) (dog) hair and dander: Secondary | ICD-10-CM | POA: Diagnosis not present

## 2024-02-10 ENCOUNTER — Ambulatory Visit: Payer: Medicare Other

## 2024-02-10 ENCOUNTER — Ambulatory Visit (INDEPENDENT_AMBULATORY_CARE_PROVIDER_SITE_OTHER)

## 2024-02-10 DIAGNOSIS — I5022 Chronic systolic (congestive) heart failure: Secondary | ICD-10-CM

## 2024-02-11 ENCOUNTER — Telehealth: Payer: Self-pay

## 2024-02-11 LAB — CUP PACEART REMOTE DEVICE CHECK
Battery Remaining Longevity: 5 mo
Battery Remaining Percentage: 6 %
Battery Voltage: 2.63 V
Brady Statistic AP VP Percent: 2.2 %
Brady Statistic AP VS Percent: 1 %
Brady Statistic AS VP Percent: 97 %
Brady Statistic AS VS Percent: 1 %
Brady Statistic RA Percent Paced: 1.9 %
Date Time Interrogation Session: 20250903040016
HighPow Impedance: 45 Ohm
HighPow Impedance: 46 Ohm
Lead Channel Impedance Value: 440 Ohm
Lead Channel Impedance Value: 480 Ohm
Lead Channel Impedance Value: 840 Ohm
Lead Channel Pacing Threshold Amplitude: 0.25 V
Lead Channel Pacing Threshold Amplitude: 0.625 V
Lead Channel Pacing Threshold Amplitude: 2 V
Lead Channel Pacing Threshold Pulse Width: 0.5 ms
Lead Channel Pacing Threshold Pulse Width: 0.5 ms
Lead Channel Pacing Threshold Pulse Width: 1 ms
Lead Channel Sensing Intrinsic Amplitude: 12 mV
Lead Channel Sensing Intrinsic Amplitude: 2.6 mV
Lead Channel Setting Pacing Amplitude: 2 V
Lead Channel Setting Pacing Amplitude: 2 V
Lead Channel Setting Pacing Amplitude: 2.5 V
Lead Channel Setting Pacing Pulse Width: 0.5 ms
Lead Channel Setting Pacing Pulse Width: 1 ms
Lead Channel Setting Sensing Sensitivity: 0.5 mV
Pulse Gen Serial Number: 7371595

## 2024-02-11 NOTE — Telephone Encounter (Signed)
 Called patient to advise monthly battery check dates due to nearing replacement time.  Informed patient once replacement time was reached someone will contact them to inform.  Patient was appreciative of call.

## 2024-02-13 ENCOUNTER — Ambulatory Visit: Payer: Self-pay | Admitting: Cardiology

## 2024-02-17 NOTE — Progress Notes (Signed)
Remote ICD Transmission.

## 2024-02-22 DIAGNOSIS — J3089 Other allergic rhinitis: Secondary | ICD-10-CM | POA: Diagnosis not present

## 2024-02-22 DIAGNOSIS — J301 Allergic rhinitis due to pollen: Secondary | ICD-10-CM | POA: Diagnosis not present

## 2024-02-22 DIAGNOSIS — J3081 Allergic rhinitis due to animal (cat) (dog) hair and dander: Secondary | ICD-10-CM | POA: Diagnosis not present

## 2024-02-26 LAB — CUP PACEART REMOTE DEVICE CHECK
Battery Remaining Longevity: 5 mo
Battery Remaining Percentage: 6 %
Battery Voltage: 2.63 V
Brady Statistic AP VP Percent: 2.2 %
Brady Statistic AP VS Percent: 1 %
Brady Statistic AS VP Percent: 97 %
Brady Statistic AS VS Percent: 1 %
Brady Statistic RA Percent Paced: 1.9 %
Date Time Interrogation Session: 20250903040016
HighPow Impedance: 45 Ohm
HighPow Impedance: 46 Ohm
Lead Channel Impedance Value: 440 Ohm
Lead Channel Impedance Value: 480 Ohm
Lead Channel Impedance Value: 840 Ohm
Lead Channel Pacing Threshold Amplitude: 0.25 V
Lead Channel Pacing Threshold Amplitude: 0.625 V
Lead Channel Pacing Threshold Amplitude: 2 V
Lead Channel Pacing Threshold Pulse Width: 0.5 ms
Lead Channel Pacing Threshold Pulse Width: 0.5 ms
Lead Channel Pacing Threshold Pulse Width: 1 ms
Lead Channel Sensing Intrinsic Amplitude: 12 mV
Lead Channel Sensing Intrinsic Amplitude: 2.6 mV
Lead Channel Setting Pacing Amplitude: 2 V
Lead Channel Setting Pacing Amplitude: 2 V
Lead Channel Setting Pacing Amplitude: 2.5 V
Lead Channel Setting Pacing Pulse Width: 0.5 ms
Lead Channel Setting Pacing Pulse Width: 1 ms
Lead Channel Setting Sensing Sensitivity: 0.5 mV
Pulse Gen Serial Number: 7371595

## 2024-03-07 DIAGNOSIS — J301 Allergic rhinitis due to pollen: Secondary | ICD-10-CM | POA: Diagnosis not present

## 2024-03-07 DIAGNOSIS — J3089 Other allergic rhinitis: Secondary | ICD-10-CM | POA: Diagnosis not present

## 2024-03-07 DIAGNOSIS — J3081 Allergic rhinitis due to animal (cat) (dog) hair and dander: Secondary | ICD-10-CM | POA: Diagnosis not present

## 2024-03-14 ENCOUNTER — Ambulatory Visit

## 2024-03-15 LAB — CUP PACEART REMOTE DEVICE CHECK
Battery Remaining Longevity: 4 mo
Battery Remaining Percentage: 4 %
Battery Voltage: 2.62 V
Brady Statistic AP VP Percent: 2.2 %
Brady Statistic AP VS Percent: 1 %
Brady Statistic AS VP Percent: 97 %
Brady Statistic AS VS Percent: 1 %
Brady Statistic RA Percent Paced: 2 %
Date Time Interrogation Session: 20251006020017
HighPow Impedance: 43 Ohm
HighPow Impedance: 43 Ohm
Lead Channel Impedance Value: 430 Ohm
Lead Channel Impedance Value: 440 Ohm
Lead Channel Impedance Value: 780 Ohm
Lead Channel Pacing Threshold Amplitude: 0.25 V
Lead Channel Pacing Threshold Amplitude: 0.75 V
Lead Channel Pacing Threshold Amplitude: 2 V
Lead Channel Pacing Threshold Pulse Width: 0.5 ms
Lead Channel Pacing Threshold Pulse Width: 0.5 ms
Lead Channel Pacing Threshold Pulse Width: 1 ms
Lead Channel Sensing Intrinsic Amplitude: 11.9 mV
Lead Channel Sensing Intrinsic Amplitude: 2 mV
Lead Channel Setting Pacing Amplitude: 2 V
Lead Channel Setting Pacing Amplitude: 2 V
Lead Channel Setting Pacing Amplitude: 2.5 V
Lead Channel Setting Pacing Pulse Width: 0.5 ms
Lead Channel Setting Pacing Pulse Width: 1 ms
Lead Channel Setting Sensing Sensitivity: 0.5 mV
Pulse Gen Serial Number: 7371595

## 2024-03-17 ENCOUNTER — Encounter: Payer: Self-pay | Admitting: Cardiovascular Disease

## 2024-03-21 DIAGNOSIS — J3089 Other allergic rhinitis: Secondary | ICD-10-CM | POA: Diagnosis not present

## 2024-03-21 DIAGNOSIS — J3081 Allergic rhinitis due to animal (cat) (dog) hair and dander: Secondary | ICD-10-CM | POA: Diagnosis not present

## 2024-03-21 DIAGNOSIS — E1159 Type 2 diabetes mellitus with other circulatory complications: Secondary | ICD-10-CM | POA: Diagnosis not present

## 2024-03-21 DIAGNOSIS — J301 Allergic rhinitis due to pollen: Secondary | ICD-10-CM | POA: Diagnosis not present

## 2024-03-23 DIAGNOSIS — W908XXD Exposure to other nonionizing radiation, subsequent encounter: Secondary | ICD-10-CM | POA: Diagnosis not present

## 2024-03-23 DIAGNOSIS — D1801 Hemangioma of skin and subcutaneous tissue: Secondary | ICD-10-CM | POA: Diagnosis not present

## 2024-03-23 DIAGNOSIS — Z86018 Personal history of other benign neoplasm: Secondary | ICD-10-CM | POA: Diagnosis not present

## 2024-03-23 DIAGNOSIS — L570 Actinic keratosis: Secondary | ICD-10-CM | POA: Diagnosis not present

## 2024-03-23 DIAGNOSIS — L821 Other seborrheic keratosis: Secondary | ICD-10-CM | POA: Diagnosis not present

## 2024-03-23 DIAGNOSIS — L814 Other melanin hyperpigmentation: Secondary | ICD-10-CM | POA: Diagnosis not present

## 2024-03-23 DIAGNOSIS — D225 Melanocytic nevi of trunk: Secondary | ICD-10-CM | POA: Diagnosis not present

## 2024-03-23 DIAGNOSIS — L578 Other skin changes due to chronic exposure to nonionizing radiation: Secondary | ICD-10-CM | POA: Diagnosis not present

## 2024-04-04 DIAGNOSIS — J3081 Allergic rhinitis due to animal (cat) (dog) hair and dander: Secondary | ICD-10-CM | POA: Diagnosis not present

## 2024-04-04 DIAGNOSIS — J3089 Other allergic rhinitis: Secondary | ICD-10-CM | POA: Diagnosis not present

## 2024-04-04 DIAGNOSIS — J301 Allergic rhinitis due to pollen: Secondary | ICD-10-CM | POA: Diagnosis not present

## 2024-04-13 DIAGNOSIS — J3081 Allergic rhinitis due to animal (cat) (dog) hair and dander: Secondary | ICD-10-CM | POA: Diagnosis not present

## 2024-04-13 DIAGNOSIS — J301 Allergic rhinitis due to pollen: Secondary | ICD-10-CM | POA: Diagnosis not present

## 2024-04-13 DIAGNOSIS — J3089 Other allergic rhinitis: Secondary | ICD-10-CM | POA: Diagnosis not present

## 2024-04-14 ENCOUNTER — Encounter

## 2024-04-14 LAB — CUP PACEART REMOTE DEVICE CHECK
Battery Remaining Longevity: 1 mo
Battery Remaining Percentage: 3 %
Battery Voltage: 2.6 V
Brady Statistic AP VP Percent: 2 %
Brady Statistic AP VS Percent: 1 %
Brady Statistic AS VP Percent: 97 %
Brady Statistic AS VS Percent: 1 %
Brady Statistic RA Percent Paced: 1.7 %
Date Time Interrogation Session: 20251106020016
HighPow Impedance: 44 Ohm
HighPow Impedance: 44 Ohm
Lead Channel Impedance Value: 400 Ohm
Lead Channel Impedance Value: 440 Ohm
Lead Channel Impedance Value: 790 Ohm
Lead Channel Pacing Threshold Amplitude: 0.25 V
Lead Channel Pacing Threshold Amplitude: 0.625 V
Lead Channel Pacing Threshold Amplitude: 2 V
Lead Channel Pacing Threshold Pulse Width: 0.5 ms
Lead Channel Pacing Threshold Pulse Width: 0.5 ms
Lead Channel Pacing Threshold Pulse Width: 1 ms
Lead Channel Sensing Intrinsic Amplitude: 11.9 mV
Lead Channel Sensing Intrinsic Amplitude: 2.2 mV
Lead Channel Setting Pacing Amplitude: 2 V
Lead Channel Setting Pacing Amplitude: 2 V
Lead Channel Setting Pacing Amplitude: 2.5 V
Lead Channel Setting Pacing Pulse Width: 0.5 ms
Lead Channel Setting Pacing Pulse Width: 1 ms
Lead Channel Setting Sensing Sensitivity: 0.5 mV
Pulse Gen Serial Number: 7371595

## 2024-04-18 DIAGNOSIS — J3081 Allergic rhinitis due to animal (cat) (dog) hair and dander: Secondary | ICD-10-CM | POA: Diagnosis not present

## 2024-04-18 DIAGNOSIS — J301 Allergic rhinitis due to pollen: Secondary | ICD-10-CM | POA: Diagnosis not present

## 2024-04-18 DIAGNOSIS — J3089 Other allergic rhinitis: Secondary | ICD-10-CM | POA: Diagnosis not present

## 2024-04-26 ENCOUNTER — Encounter: Payer: Self-pay | Admitting: Student in an Organized Health Care Education/Training Program

## 2024-04-26 ENCOUNTER — Ambulatory Visit
Attending: Student in an Organized Health Care Education/Training Program | Admitting: Student in an Organized Health Care Education/Training Program

## 2024-04-26 VITALS — BP 102/60 | HR 83 | Ht 70.0 in | Wt 184.6 lb

## 2024-04-26 DIAGNOSIS — I5020 Unspecified systolic (congestive) heart failure: Secondary | ICD-10-CM | POA: Insufficient documentation

## 2024-04-26 LAB — CUP PACEART INCLINIC DEVICE CHECK
Battery Remaining Longevity: 2 mo
Brady Statistic RA Percent Paced: 1.6 %
Brady Statistic RV Percent Paced: 99.02 %
Date Time Interrogation Session: 20251118121400
HighPow Impedance: 46.9331
Implantable Lead Connection Status: 753985
Implantable Lead Connection Status: 753985
Implantable Lead Connection Status: 753985
Implantable Lead Implant Date: 20101005
Implantable Lead Implant Date: 20101005
Implantable Lead Implant Date: 20101005
Implantable Lead Location: 753858
Implantable Lead Location: 753859
Implantable Lead Location: 753860
Implantable Lead Model: 1944
Implantable Pulse Generator Implant Date: 20171127
Lead Channel Impedance Value: 437.5 Ohm
Lead Channel Impedance Value: 462.5 Ohm
Lead Channel Impedance Value: 862.5 Ohm
Lead Channel Pacing Threshold Amplitude: 0.25 V
Lead Channel Pacing Threshold Amplitude: 0.625 V
Lead Channel Pacing Threshold Pulse Width: 0.5 ms
Lead Channel Pacing Threshold Pulse Width: 0.5 ms
Lead Channel Sensing Intrinsic Amplitude: 11.9 mV
Lead Channel Sensing Intrinsic Amplitude: 2.5 mV
Lead Channel Setting Pacing Amplitude: 2 V
Lead Channel Setting Pacing Amplitude: 2 V
Lead Channel Setting Pacing Amplitude: 2.5 V
Lead Channel Setting Pacing Pulse Width: 0.5 ms
Lead Channel Setting Pacing Pulse Width: 1 ms
Lead Channel Setting Sensing Sensitivity: 0.5 mV
Pulse Gen Serial Number: 7371595

## 2024-04-26 NOTE — Patient Instructions (Signed)
 Medication Instructions:  Your physician recommends that you continue on your current medications as directed. Please refer to the Current Medication list given to you today.  *If you need a refill on your cardiac medications before your next appointment, please call your pharmacy*  Lab Work: None ordered.  If you have labs (blood work) drawn today and your tests are completely normal, you will receive your results only by: MyChart Message (if you have MyChart) OR A paper copy in the mail If you have any lab test that is abnormal or we need to change your treatment, we will call you to review the results.  Testing/Procedures: None ordered.   Follow-Up: At [facility], you and your health needs are our priority.  As part of our continuing mission to provide you with exceptional heart care, our providers are all part of one team.  This team includes your primary Cardiologist (physician) and Advanced Practice Providers or APPs (Physician Assistants and Nurse Practitioners) who all work together to provide you with the care you need, when you need it.  Your next appointment:   12 months

## 2024-04-26 NOTE — Progress Notes (Signed)
 " Cardiology Office Note   Date: 04/26/24  ID:  Randal, Goens 1945-11-15, MRN [id number] PCP: Frederik Charleston, MD  [facility] Providers Cardiologist:  Lonni Cash, MD Electrophysiologist:  Donnice DELENA Primus, MD   History of Present Illness Todd [name] is a 78 y.o. 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  History of Present Illness CHEZ BULNES is a 78 year old male with a pacemaker who presents for evaluation of atrial lead issues. He is accompanied by his wife, Dayla. He follows with Dr. Cash for CAD management.  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  He has a history of pacemaker implantation, initially placed in 2010 in [place] . Approximately two years ago, a microcrack was noted in the atrial lead of his pacemaker. He has been monitoring the situation and was previously scheduled to discuss options with Dr. Fernande before his retirement. He is concerned about the potential risks associated with lead extraction, including infection and damage to existing leads.  He is concerned about future risks, particularly during battery change-out procedures.  He remains active and reports no limitations in his activities due to the pacemaker. He is keen on maintaining the device's functionality and is interested in understanding all available options for managing the lead issue.  I reviewed Mr. Ganim lead interrogations over the past year with chronic RA lead noise.  He is not device dependent but responded well to CRT with near normalization of his LVEF.  He has appropriate BiV pacing and his RA lead noise is not affecting his device function at this time.  We discussed different options for management of his atrial lead.  ROS: none   Studies Reviewed  ECG  review 11/09/23: ASVP 73, PR 134, QRS 156, QT/c 450/495, Q aVL/I, Rs V1/V2  TTE  Result date: 02/25/22  1. Small linear density seen on the pacemaker lead in the RA. Suspect  this represents thrombus which commonly is seen on these leads. If there  are clinical concerns for endocarditis would check blood cultures and  obtain a TEE. Best seen on image 58.   2. Left ventricular ejection fraction, by estimation, is 50 to 55%. The  left ventricle has low normal function. The left ventricle has no regional  wall motion abnormalities. Left ventricular diastolic parameters are  consistent with Grade I diastolic  dysfunction (impaired relaxation).   3. Right ventricular systolic function is normal. The right ventricular  size is mildly enlarged. There is normal pulmonary artery systolic  pressure. The estimated right ventricular systolic pressure is 33.5 mmHg.   4. The mitral valve is grossly normal. Mild mitral valve regurgitation.  No evidence of mitral stenosis.   5. The aortic valve is tricuspid. Aortic valve regurgitation is not  visualized. No aortic stenosis is present.   6. The inferior vena cava is normal in size with greater than 50%  respiratory variability, suggesting right atrial pressure of 3 mmHg.   Physical Exam VS:  BP 102/60   Pulse 83   Ht 5' 10 (1.778 m)   Wt 184 lb 9.6 oz (83.7 kg)   SpO2 97%   BMI 26.49 kg/m       Wt Readings from Last 3 Encounters:  04/26/24 184 lb 9.6 oz (83.7 kg)  11/19/23 186 lb (84.4 kg)  11/09/23 186 lb 9.6 oz (84.6 kg)    GEN: Well nourished, well developed in no acute distress CARDIAC: RRR, no murmurs, rubs, gallops RESPIRATORY:  Clear to auscultation without rales, wheezing or rhonchi  EXTREMITIES:  No edema; No deformity  SKIN: LEFT infraclavicular incision well healed, no erythema or swelling   Device information  CRTD: SJM 3357-40 Unify Assura RA: SJM 1945 IsoFlex Optim, SN [id number], DOI 03/13/09 RV: SJM 7170Q Rico, SN  [id number], DOI 03/13/09 LV: SJM 1158T QuickFlex XL, SN [id number], DOI 03/13/09  ASSESSMENT AND PLAN KENY DONALD is a 78 y.o. 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  NICM/ICM HFrecEF LEFT Abbott CRTD He has had great response to CRT and his initial device was implanted in 2010.  His RA lead is a SJM 1944 Isoflex Optim.  With ERI in the next 1 to 3 months we reviewed different options for management of his device including generator change versus lead extraction of the RA lead and replacement.  At this point he has minimal atrial pacing of 1.3% and has not had any atrial pacing above 2% in the past year.  His CRT is over 99% and has been consistently despite some atrial lead noise.  He understands that his RA lead likely has a small fracture but this does not seem to be impeding his device function.  The risk of lead extraction and reimplantation or 1/200-1/300 of damage to the SVC and/or heart wall requiring redo median sternotomy.  This risk may be slightly less since he has had prior sternotomy however the risk of SVC laceration is still not insignificant.  My main concern with device revision and reimplant of an RA lead would be damaging his existing LV lead which he has had great response with CRT and appears to be in a high lateral CS position.  I would not know whether he had other options for LV lead placement if we had damage to the lead while trying to extract his RA lead.  He understands that it by leaving the RA lead and not replacing it then it could fail in the future and we would have to then consider lead extraction then.  Additionally I think his risk of infection would be higher with lead extraction and or a lead to reimplant as opposed to generator change based off procedural time and invasiveness.  With all these considerations he would prefer to proceed with generator change and continue  monitoring the RA lead.     Dispo: RTC following generator change, schedule gen change when at [facility]   A total of 45 minutes was spent preparing for the patient, reviewing history, performing exam, document encounter, coordinating care and counseling the patient. 34 minutes was spent with direct patient care.   Signed, Donnice DELENA Primus, MD  "

## 2024-04-28 ENCOUNTER — Ambulatory Visit: Payer: Self-pay | Admitting: Student in an Organized Health Care Education/Training Program

## 2024-05-02 DIAGNOSIS — J301 Allergic rhinitis due to pollen: Secondary | ICD-10-CM | POA: Diagnosis not present

## 2024-05-02 DIAGNOSIS — J3081 Allergic rhinitis due to animal (cat) (dog) hair and dander: Secondary | ICD-10-CM | POA: Diagnosis not present

## 2024-05-02 DIAGNOSIS — J3089 Other allergic rhinitis: Secondary | ICD-10-CM | POA: Diagnosis not present

## 2024-05-11 ENCOUNTER — Ambulatory Visit: Payer: Medicare Other

## 2024-05-15 ENCOUNTER — Ambulatory Visit: Attending: Student in an Organized Health Care Education/Training Program

## 2024-05-15 DIAGNOSIS — I5020 Unspecified systolic (congestive) heart failure: Secondary | ICD-10-CM | POA: Diagnosis not present

## 2024-05-16 ENCOUNTER — Encounter

## 2024-05-16 DIAGNOSIS — J3089 Other allergic rhinitis: Secondary | ICD-10-CM | POA: Diagnosis not present

## 2024-05-16 DIAGNOSIS — J301 Allergic rhinitis due to pollen: Secondary | ICD-10-CM | POA: Diagnosis not present

## 2024-05-16 DIAGNOSIS — J3081 Allergic rhinitis due to animal (cat) (dog) hair and dander: Secondary | ICD-10-CM | POA: Diagnosis not present

## 2024-05-16 LAB — CUP PACEART REMOTE DEVICE CHECK
Battery Remaining Longevity: 1 mo
Battery Remaining Percentage: 1 %
Battery Voltage: 2.6 V
Brady Statistic AP VP Percent: 1 %
Brady Statistic AP VS Percent: 1 %
Brady Statistic AS VP Percent: 97 %
Brady Statistic AS VS Percent: 1.7 %
Brady Statistic RA Percent Paced: 1 %
Date Time Interrogation Session: 20251207182400
HighPow Impedance: 44 Ohm
HighPow Impedance: 44 Ohm
Lead Channel Impedance Value: 440 Ohm
Lead Channel Impedance Value: 450 Ohm
Lead Channel Impedance Value: 890 Ohm
Lead Channel Pacing Threshold Amplitude: 0.25 V
Lead Channel Pacing Threshold Amplitude: 0.625 V
Lead Channel Pacing Threshold Amplitude: 2 V
Lead Channel Pacing Threshold Pulse Width: 0.5 ms
Lead Channel Pacing Threshold Pulse Width: 0.5 ms
Lead Channel Pacing Threshold Pulse Width: 1 ms
Lead Channel Sensing Intrinsic Amplitude: 1.9 mV
Lead Channel Sensing Intrinsic Amplitude: 11.9 mV
Lead Channel Setting Pacing Amplitude: 2 V
Lead Channel Setting Pacing Amplitude: 2 V
Lead Channel Setting Pacing Amplitude: 2.5 V
Lead Channel Setting Pacing Pulse Width: 0.5 ms
Lead Channel Setting Pacing Pulse Width: 1 ms
Lead Channel Setting Sensing Sensitivity: 0.5 mV
Pulse Gen Serial Number: 7371595

## 2024-05-23 ENCOUNTER — Telehealth: Payer: Self-pay

## 2024-05-23 NOTE — Telephone Encounter (Signed)
 Alert remote transmission:  ERI reached 05/20/24  Saw Dr. Almetta on 04/26/24, gen change discussed.

## 2024-05-23 NOTE — Telephone Encounter (Signed)
 Called and spoke w/ patient regarding device reaching ERI. Previously seen Dr. Almetta on 04/26/2024 and discussed Gen Change at this time. Guaranteed 3 months once ERI is reached. Patient verbalized understanding.   Will forward to our scheduling team in order to schedule upcoming Gen Change.   Informed patient to be on the lookout for a call from scheduling team and to reach out to device clinic for any questions or concerns. Patient appreciative for call.

## 2024-05-24 DIAGNOSIS — E1159 Type 2 diabetes mellitus with other circulatory complications: Secondary | ICD-10-CM | POA: Diagnosis not present

## 2024-05-24 DIAGNOSIS — E1169 Type 2 diabetes mellitus with other specified complication: Secondary | ICD-10-CM | POA: Diagnosis not present

## 2024-05-24 DIAGNOSIS — E291 Testicular hypofunction: Secondary | ICD-10-CM | POA: Diagnosis not present

## 2024-05-24 NOTE — Progress Notes (Signed)
 Remote ICD Transmission

## 2024-05-25 ENCOUNTER — Telehealth: Payer: Self-pay

## 2024-05-25 NOTE — Telephone Encounter (Signed)
 Spoke with pt to discuss generator change date.  Pt is agreeable to 06/15/2024.  Pt advised instructions will be placed on MyChart and he will be contacted to review.  Pt thanked CHARITY FUNDRAISER for the call.

## 2024-05-25 NOTE — Addendum Note (Signed)
 Addended by: CASIMIR ALDONA BRAVO on: 05/25/2024 02:31 PM   Modules accepted: Orders

## 2024-05-25 NOTE — Telephone Encounter (Signed)
-----   Message from Nurse Aldona SAUNDERS, RN sent at 05/25/2024  2:54 PM EST ----- Regarding: 06/15/24 - 1230 BiV-ICD gen change Almetta Important: list procedure date as first item in subject line, followed by procedure type (e.g., 02/19/24 PPM implant)  Precert:  MD: Almetta Type of implant: CRT-D Device manufacturer: Abbott Diagnosis: ERI CPT code: CRT-D change out (no LV lead change) - 33264 C-code(s), including quantity (if indicated):  Procedure scheduled (date/time): 01/07 1230  Procedure:  Scrub given? Yes at coumadin desk Medication instructions: Hold ASA day of.others routine Message sent to CVRR? No Added to calendar? Yes Orders entered? Yes Letter complete? Yes Scheduled with cath lab? Yes Labs ordered (CBC, BMET, PT/INR if on warfarin)? CBC and BMET Dye allergy? No Pre-meds ordered and instructions given? N/A Letter method: MyChart Special instructions: No H&P saw Almetta 11/25  Follow-up:  Cassie/Angel, please schedule Routine.  Covering RN:  Please send this message to [employer], EP scheduler, EP Scheduling pool, and EP [employer].

## 2024-05-27 ENCOUNTER — Other Ambulatory Visit (HOSPITAL_COMMUNITY): Payer: Self-pay

## 2024-05-27 DIAGNOSIS — Z79899 Other long term (current) drug therapy: Secondary | ICD-10-CM

## 2024-05-27 DIAGNOSIS — I5020 Unspecified systolic (congestive) heart failure: Secondary | ICD-10-CM

## 2024-05-27 DIAGNOSIS — I428 Other cardiomyopathies: Secondary | ICD-10-CM

## 2024-05-27 DIAGNOSIS — Z95810 Presence of automatic (implantable) cardiac defibrillator: Secondary | ICD-10-CM

## 2024-06-01 ENCOUNTER — Ambulatory Visit: Payer: Self-pay | Admitting: Student in an Organized Health Care Education/Training Program

## 2024-06-07 ENCOUNTER — Ambulatory Visit: Payer: Self-pay

## 2024-06-07 LAB — BASIC METABOLIC PANEL WITH GFR
BUN/Creatinine Ratio: 19 (ref 10–24)
BUN: 21 mg/dL (ref 8–27)
CO2: 20 mmol/L (ref 20–29)
Calcium: 9.9 mg/dL (ref 8.6–10.2)
Chloride: 101 mmol/L (ref 96–106)
Creatinine, Ser: 1.1 mg/dL (ref 0.76–1.27)
Glucose: 232 mg/dL — ABNORMAL HIGH (ref 70–99)
Potassium: 4.5 mmol/L (ref 3.5–5.2)
Sodium: 140 mmol/L (ref 134–144)
eGFR: 68 mL/min/1.73

## 2024-06-07 LAB — CBC
Hematocrit: 41.4 % (ref 37.5–51.0)
Hemoglobin: 13.5 g/dL (ref 13.0–17.7)
MCH: 27.6 pg (ref 26.6–33.0)
MCHC: 32.6 g/dL (ref 31.5–35.7)
MCV: 85 fL (ref 79–97)
Platelets: 171 x10E3/uL (ref 150–450)
RBC: 4.89 x10E6/uL (ref 4.14–5.80)
RDW: 14.2 % (ref 11.6–15.4)
WBC: 5.6 x10E3/uL (ref 3.4–10.8)

## 2024-06-14 NOTE — Pre-Procedure Instructions (Signed)
Instructed patient on the following items: Arrival time 1030  Nothing to eat or drink after midnight No meds AM of procedure Responsible person to drive you home and stay with you for 24 hrs Wash with special soap night before and morning of procedure 

## 2024-06-15 ENCOUNTER — Ambulatory Visit: Attending: Student in an Organized Health Care Education/Training Program

## 2024-06-15 ENCOUNTER — Ambulatory Visit (HOSPITAL_COMMUNITY)
Admission: RE | Admit: 2024-06-15 | Discharge: 2024-06-15 | Disposition: A | Discharge status: Home / Self Care | Attending: Student in an Organized Health Care Education/Training Program | Admitting: Student in an Organized Health Care Education/Training Program

## 2024-06-15 ENCOUNTER — Encounter (HOSPITAL_COMMUNITY): Payer: Self-pay | Admitting: Student in an Organized Health Care Education/Training Program

## 2024-06-15 ENCOUNTER — Ambulatory Visit (HOSPITAL_COMMUNITY)
Admission: RE | Disposition: A | Discharge status: Home / Self Care | Payer: Self-pay | Source: Home / Self Care | Attending: Student in an Organized Health Care Education/Training Program

## 2024-06-15 DIAGNOSIS — E785 Hyperlipidemia, unspecified: Secondary | ICD-10-CM | POA: Diagnosis not present

## 2024-06-15 DIAGNOSIS — I5040 Unspecified combined systolic (congestive) and diastolic (congestive) heart failure: Secondary | ICD-10-CM | POA: Diagnosis not present

## 2024-06-15 DIAGNOSIS — Z4502 Encounter for adjustment and management of automatic implantable cardiac defibrillator: Secondary | ICD-10-CM | POA: Diagnosis not present

## 2024-06-15 DIAGNOSIS — Z951 Presence of aortocoronary bypass graft: Secondary | ICD-10-CM | POA: Insufficient documentation

## 2024-06-15 DIAGNOSIS — I428 Other cardiomyopathies: Secondary | ICD-10-CM | POA: Insufficient documentation

## 2024-06-15 DIAGNOSIS — I2510 Atherosclerotic heart disease of native coronary artery without angina pectoris: Secondary | ICD-10-CM | POA: Diagnosis not present

## 2024-06-15 HISTORY — PX: BIV ICD GENERATOR CHANGEOUT: EP1194

## 2024-06-15 LAB — GLUCOSE, CAPILLARY: Glucose-Capillary: 207 mg/dL — ABNORMAL HIGH (ref 70–99)

## 2024-06-15 MED ORDER — MIDAZOLAM HCL 2 MG/2ML IJ SOLN
INTRAMUSCULAR | Status: AC
  Filled 2024-06-15: qty 2

## 2024-06-15 MED ORDER — MIDAZOLAM HCL 5 MG/5ML IJ SOLN
INTRAMUSCULAR | Status: DC | PRN
  Administered 2024-06-15 (×3): 1 mg via INTRAVENOUS

## 2024-06-15 MED ORDER — SODIUM CHLORIDE 0.9 % IV SOLN: 80.0000 mg | INTRAVENOUS | Status: AC

## 2024-06-15 MED ORDER — LIDOCAINE HCL (PF) 1 % IJ SOLN
INTRAMUSCULAR | Status: AC
  Filled 2024-06-15: qty 60

## 2024-06-15 MED ORDER — CEFAZOLIN SODIUM-DEXTROSE 2-4 GM/100ML-% IV SOLN: 2.0000 g | INTRAVENOUS | Status: AC

## 2024-06-15 MED ORDER — CEFAZOLIN SODIUM-DEXTROSE 2-4 GM/100ML-% IV SOLN
INTRAVENOUS | Status: AC
  Administered 2024-06-15: 2 g via INTRAVENOUS
  Filled 2024-06-15: qty 100

## 2024-06-15 MED ORDER — CHLORHEXIDINE GLUCONATE 4 % EX SOLN: 4.0000 | Freq: Once | CUTANEOUS | Status: DC

## 2024-06-15 MED ORDER — POVIDONE-IODINE 10 % EX SWAB
2.0000 | Freq: Once | CUTANEOUS | Status: AC
  Administered 2024-06-15: 2 via TOPICAL

## 2024-06-15 MED ORDER — FENTANYL CITRATE (PF) 100 MCG/2ML IJ SOLN
INTRAMUSCULAR | Status: DC | PRN
  Administered 2024-06-15 (×3): 25 ug via INTRAVENOUS

## 2024-06-15 MED ORDER — FENTANYL CITRATE (PF) 100 MCG/2ML IJ SOLN
INTRAMUSCULAR | Status: AC
  Filled 2024-06-15: qty 2

## 2024-06-15 MED ORDER — SODIUM CHLORIDE 0.9 % IV SOLN
INTRAVENOUS | Status: AC
  Administered 2024-06-15: 80 mg
  Filled 2024-06-15: qty 2

## 2024-06-15 MED ORDER — LIDOCAINE HCL (PF) 1 % IJ SOLN
INTRAMUSCULAR | Status: DC | PRN
  Administered 2024-06-15: 60 mL

## 2024-06-15 MED ORDER — SODIUM CHLORIDE 0.9 % IV SOLN: INTRAVENOUS | Status: DC

## 2024-06-15 NOTE — Interval H&P Note (Signed)
 History and Physical Interval Note:  06/15/2024 1:12 PM  Todd [name]  has presented today for surgery, with the diagnosis of eri.  The various methods of treatment have been discussed with the patient and family. After consideration of risks, benefits and other options for treatment, the patient has consented to  Procedures: BIV ICD GENERATOR CHANGEOUT (N/A) as a surgical intervention.  The patient's history has been reviewed, patient examined, no change in status, stable for surgery.  I have reviewed the patient's chart and labs.  Questions were answered to the patient's satisfaction.     Todd [name]

## 2024-06-15 NOTE — Discharge Instructions (Signed)

## 2024-06-15 NOTE — H&P (View-Only) (Signed)
 "  Date: 06/15/24 ID:  Todd [name], DOB 1946-02-09, MRN [id number] PCP: Frederik Charleston, MD  [facility] Providers Cardiologist:  Lonni Cash, MD Electrophysiologist:  Donnice DELENA Primus, MD    History of Present Illness Todd [name] is a 79 y.o. 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   History of Present Illness Todd [name] is a 79 year old male with a pacemaker who presents for evaluation of atrial lead issues. He is accompanied by his wife, Dayla. He follows with Dr. Cash for CAD management.  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   He has a history of pacemaker implantation, initially placed in 2010 in [place] . Approximately two years ago, a microcrack was noted in the atrial lead of his pacemaker. He has been monitoring the situation and was previously scheduled to discuss options with Dr. Fernande before his retirement. He is concerned about the potential risks associated with lead extraction, including infection and damage to existing leads.   He is concerned about future risks, particularly during battery change-out procedures.   He remains active and reports no limitations in his activities due to the pacemaker. He is keen on maintaining the device's functionality and is interested in understanding all available options for managing the lead issue.   I reviewed Mr. Thall lead interrogations over the past year with chronic RA lead noise.  He is not device dependent but responded well to CRT with near normalization of his LVEF.  He has appropriate BiV pacing and his RA lead noise is not affecting his device function at this time.  We discussed different options for management of his atrial lead.   ROS: none    Studies Reviewed   ECG review 11/09/23: ASVP 73, PR  134, QRS 156, QT/c 450/495, Q aVL/I, Rs V1/V2   TTE  Result date: 02/25/22  1. Small linear density seen on the pacemaker lead in the RA. Suspect  this represents thrombus which commonly is seen on these leads. If there  are clinical concerns for endocarditis would check blood cultures and  obtain a TEE. Best seen on image 58.   2. Left ventricular ejection fraction, by estimation, is 50 to 55%. The  left ventricle has low normal function. The left ventricle has no regional  wall motion abnormalities. Left ventricular diastolic parameters are  consistent with Grade I diastolic  dysfunction (impaired relaxation).   3. Right ventricular systolic function is normal. The right ventricular  size is mildly enlarged. There is normal pulmonary artery systolic  pressure. The estimated right ventricular systolic pressure is 33.5 mmHg.   4. The mitral valve is grossly normal. Mild mitral valve regurgitation.  No evidence of mitral stenosis.   5. The aortic valve is tricuspid. Aortic valve regurgitation is not  visualized. No aortic stenosis is present.   6. The inferior vena cava is normal in size with greater than 50%  respiratory variability, suggesting right atrial pressure of 3 mmHg.    Physical Exam VS:  BP 102/60   Pulse 83   Ht 5' 10 (1.778 m)   Wt 184 lb 9.6 oz (83.7 kg)   SpO2 97%   BMI 26.49 kg/m          Wt Readings from Last 3 Encounters:  04/26/24 184 lb 9.6  oz (83.7 kg)  11/19/23 186 lb (84.4 kg)  11/09/23 186 lb 9.6 oz (84.6 kg)    GEN: Well nourished, well developed in no acute distress CARDIAC: RRR, no murmurs, rubs, gallops RESPIRATORY:  Clear to auscultation without rales, wheezing or rhonchi  EXTREMITIES:  No edema; No deformity  SKIN: LEFT infraclavicular incision well healed, no erythema or swelling    Device information  CRTD: SJM 3357-40 Unify Assura RA: SJM 1944 IsoFlex Optim, SN [id number], DOI 03/13/09 RV: SJM 7170Q Rico, SN [id number], DOI 03/13/09 LV:  SJM 1158T QuickFlex XL, SN [id number], DOI 03/13/09   ASSESSMENT AND PLAN Todd [name] is a 79 y.o. 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   NICM/ICM HFrecEF LEFT Abbott CRTD He has had great response to CRT and his initial device was implanted in 2010.  His RA lead is a SJM 1944 Isoflex Optim.  With ERI in the next 1 to 3 months we reviewed different options for management of his device including generator change versus lead extraction of the RA lead and replacement.  At this point he has minimal atrial pacing of 1.3% and has not had any atrial pacing above 2% in the past year.  His CRT is over 99% and has been consistently despite some atrial lead noise.  He understands that his RA lead likely has a small fracture but this does not seem to be impeding his device function.  The risk of lead extraction and reimplantation or 1/200-1/300 of damage to the SVC and/or heart wall requiring redo median sternotomy.  This risk may be slightly less since he has had prior sternotomy however the risk of SVC laceration is still not insignificant.  My main concern with device revision and reimplant of an RA lead would be damaging his existing LV lead which he has had great response with CRT and appears to be in a high lateral CS position.  I would not know whether he had other options for LV lead placement if we had damage to the lead while trying to extract his RA lead.  He understands that it by leaving the RA lead and not replacing it then it could fail in the future and we would have to then consider lead extraction then.  Additionally I think his risk of infection would be higher with lead extraction and or a lead to reimplant as opposed to generator change based off procedural time and invasiveness.  With all these considerations he would prefer to proceed with generator change and continue monitoring the RA lead.      Proceed with generator replacement. Reviewed risks/benefit and alternatives again.    "

## 2024-06-15 NOTE — Progress Notes (Signed)
 Discharge instructions reviewed with patient and spouse at bedside. Denies questions concerns. Seen by device rep. Incision site remains clean dry and intact. No s/s of complications. PT escorted from the unit via wheel chair to personal vehicle.

## 2024-06-15 NOTE — Progress Notes (Signed)
 "  Date: 06/15/24 ID:  Todd [name], DOB 1946-02-09, MRN [id number] PCP: Frederik Charleston, MD  [facility] Providers Cardiologist:  Lonni Cash, MD Electrophysiologist:  Donnice DELENA Primus, MD    History of Present Illness Todd [name] is a 79 y.o. 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   History of Present Illness Todd [name] is a 79 year old male with a pacemaker who presents for evaluation of atrial lead issues. He is accompanied by his wife, Todd [name]. He follows with Dr. Cash for CAD management.  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   He has a history of pacemaker implantation, initially placed in 2010 in [place] . Approximately two years ago, a microcrack was noted in the atrial lead of his pacemaker. He has been monitoring the situation and was previously scheduled to discuss options with Dr. Fernande before his retirement. He is concerned about the potential risks associated with lead extraction, including infection and damage to existing leads.   He is concerned about future risks, particularly during battery change-out procedures.   He remains active and reports no limitations in his activities due to the pacemaker. He is keen on maintaining the device's functionality and is interested in understanding all available options for managing the lead issue.   I reviewed Todd [name] lead interrogations over the past year with chronic RA lead noise.  He is not device dependent but responded well to CRT with near normalization of his LVEF.  He has appropriate BiV pacing and his RA lead noise is not affecting his device function at this time.  We discussed different options for management of his atrial lead.   ROS: none    Studies Reviewed   ECG review 11/09/23: ASVP 73, PR  134, QRS 156, QT/c 450/495, Q aVL/I, Rs V1/V2   TTE  Result date: 02/25/22  1. Small linear density seen on the pacemaker lead in the RA. Suspect  this represents thrombus which commonly is seen on these leads. If there  are clinical concerns for endocarditis would check blood cultures and  obtain a TEE. Best seen on image 58.   2. Left ventricular ejection fraction, by estimation, is 50 to 55%. The  left ventricle has low normal function. The left ventricle has no regional  wall motion abnormalities. Left ventricular diastolic parameters are  consistent with Grade I diastolic  dysfunction (impaired relaxation).   3. Right ventricular systolic function is normal. The right ventricular  size is mildly enlarged. There is normal pulmonary artery systolic  pressure. The estimated right ventricular systolic pressure is 33.5 mmHg.   4. The mitral valve is grossly normal. Mild mitral valve regurgitation.  No evidence of mitral stenosis.   5. The aortic valve is tricuspid. Aortic valve regurgitation is not  visualized. No aortic stenosis is present.   6. The inferior vena cava is normal in size with greater than 50%  respiratory variability, suggesting right atrial pressure of 3 mmHg.    Physical Exam VS:  BP 102/60   Pulse 83   Ht 5' 10 (1.778 m)   Wt 184 lb 9.6 oz (83.7 kg)   SpO2 97%   BMI 26.49 kg/m          Wt Readings from Last 3 Encounters:  04/26/24 184 lb 9.6  oz (83.7 kg)  11/19/23 186 lb (84.4 kg)  11/09/23 186 lb 9.6 oz (84.6 kg)    GEN: Well nourished, well developed in no acute distress CARDIAC: RRR, no murmurs, rubs, gallops RESPIRATORY:  Clear to auscultation without rales, wheezing or rhonchi  EXTREMITIES:  No edema; No deformity  SKIN: LEFT infraclavicular incision well healed, no erythema or swelling    Device information  CRTD: SJM 3357-40 Unify Assura RA: SJM 1944 IsoFlex Optim, SN [id number], DOI 03/13/09 RV: SJM 7170Q Rico, SN [id number], DOI 03/13/09 LV:  SJM 1158T QuickFlex XL, SN [id number], DOI 03/13/09   ASSESSMENT AND PLAN Todd [name] is a 79 y.o. 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   NICM/ICM HFrecEF LEFT Abbott CRTD He has had great response to CRT and his initial device was implanted in 2010.  His RA lead is a SJM 1944 Isoflex Optim.  With ERI in the next 1 to 3 months we reviewed different options for management of his device including generator change versus lead extraction of the RA lead and replacement.  At this point he has minimal atrial pacing of 1.3% and has not had any atrial pacing above 2% in the past year.  His CRT is over 99% and has been consistently despite some atrial lead noise.  He understands that his RA lead likely has a small fracture but this does not seem to be impeding his device function.  The risk of lead extraction and reimplantation or 1/200-1/300 of damage to the SVC and/or heart wall requiring redo median sternotomy.  This risk may be slightly less since he has had prior sternotomy however the risk of SVC laceration is still not insignificant.  My main concern with device revision and reimplant of an RA lead would be damaging his existing LV lead which he has had great response with CRT and appears to be in a high lateral CS position.  I would not know whether he had other options for LV lead placement if we had damage to the lead while trying to extract his RA lead.  He understands that it by leaving the RA lead and not replacing it then it could fail in the future and we would have to then consider lead extraction then.  Additionally I think his risk of infection would be higher with lead extraction and or a lead to reimplant as opposed to generator change based off procedural time and invasiveness.  With all these considerations he would prefer to proceed with generator change and continue monitoring the RA lead.      Proceed with generator replacement. Reviewed risks/benefit and alternatives again.    "

## 2024-06-15 NOTE — Op Note (Signed)
" ° °  Procedure:  LEFT sided Abbott CRTD generator change  Pre-op  Diagnosis: mixed ICM/NICM, HFrEF    Post-op Diagnosis: same  Procedure Date: 06/15/2024  Attending: Adina Primus, MD   Anesthesia: monitored anesthesia care  Procedure Report:  The LEFT shoulder was prepped with chlorhexidine  scrub. 30 cc lidocaine  was injected at the planned incision site. The subcutaneous tissue was dissected with electrocautery and blunt dissection. The pocket overlying the device was dissected to allow for removal of the device. The leads were then removed from the implanted device.  The new device was then connected to the existing leads. The pocket was irrigated with Irrisept. A TYRX pouch was used.   The pocket was closed with continuous 2-0 V-loc and 4-0 monocryl suture. Dermabond was used to close the superficial layer.    The device was evaluated by the representative of the cardiac device manufacturer under the supervision of the attending physician with implant parameters listed below.   Complications: none Estimated blood loss: less than 50 mL  Implanted Hardware: Implant Name Type Inv. Item Serial No. Manufacturer Lot No. LRB No. Used Action  ICD GALLANT HF CRT-D DF-4 IS-1 - [id number] ICD Generator ICD GALLANT HF CRT-D DF-4 IS-1 [id number] [employer]  N/A 1 Implanted  POUCH AIGIS-R ANTIBACT ICD LRG - [id number] Mesh General POUCH AIGIS-R ANTIBACT ICD LRG  [employer] [id number] N/A 1 Implanted  SJCR 3357-40 UNIFY ASSURA [id number]   [id number] [employer] JUDE MED CARDIAC RHYTHM M   1 Explanted   Device Information: CRTD Generator: Abbott Gallant HF [id number], [place] [id number] RA: SJM IsoFlex Optim 1944/52 cm, SN [id number], DOI 03/13/09 RV: SJM Durata 7170Q/58 cm, SN [id number], DOI 03/13/09 LV: SJM QuickFlex XL 1158T/86 cm, SN [id number], DOI 03/13/09   Lead Interrogation Data: RA: 2.0 mV / 490 ohms / 0.37 V @ 0.5 ms RV: 11.7 mV / 430 ohms / 0.625 V @ 0.5 ms LV: 810 ohms / 2.125 V @  0.5 ms  Shock Impedance: 44 ohms  Summary: 1. Successful LEFT Abbott CRTD generator replacement   Recommendations: Routine post-procedure care with bedrest for 3 hours No heparin (IV or subcutaneous) for 48 hours. No enoxaparin  (IV or subcutaneous) for 7 days.  Resume ASA 81 mg daily 06/16/24 Discharge after post-procedure bedrest.  Todd DELENA Primus, MD [facility]  Cardiac Electrophysiology      "

## 2024-06-16 ENCOUNTER — Encounter

## 2024-06-16 ENCOUNTER — Encounter (HOSPITAL_COMMUNITY): Payer: Self-pay | Admitting: Student in an Organized Health Care Education/Training Program

## 2024-06-16 LAB — CUP PACEART REMOTE DEVICE CHECK
Battery Remaining Longevity: 0 mo
Battery Voltage: 2.57 V
Brady Statistic AP VP Percent: 1.3 %
Brady Statistic AP VS Percent: 1 %
Brady Statistic AS VP Percent: 96 %
Brady Statistic AS VS Percent: 2.2 %
Brady Statistic RA Percent Paced: 1 %
Date Time Interrogation Session: 20260107040030
HighPow Impedance: 42 Ohm
HighPow Impedance: 42 Ohm
Lead Channel Impedance Value: 400 Ohm
Lead Channel Impedance Value: 440 Ohm
Lead Channel Impedance Value: 750 Ohm
Lead Channel Pacing Threshold Amplitude: 0.25 V
Lead Channel Pacing Threshold Amplitude: 0.625 V
Lead Channel Pacing Threshold Amplitude: 2 V
Lead Channel Pacing Threshold Pulse Width: 0.5 ms
Lead Channel Pacing Threshold Pulse Width: 0.5 ms
Lead Channel Pacing Threshold Pulse Width: 1 ms
Lead Channel Sensing Intrinsic Amplitude: 11.9 mV
Lead Channel Sensing Intrinsic Amplitude: 2 mV
Lead Channel Setting Pacing Amplitude: 2 V
Lead Channel Setting Pacing Amplitude: 2 V
Lead Channel Setting Pacing Amplitude: 2.5 V
Lead Channel Setting Pacing Pulse Width: 0.5 ms
Lead Channel Setting Pacing Pulse Width: 1 ms
Lead Channel Setting Sensing Sensitivity: 0.5 mV
Pulse Gen Serial Number: 7371595

## 2024-06-16 MED FILL — Midazolam HCl Inj 2 MG/2ML (Base Equivalent): INTRAMUSCULAR | Qty: 3 | Status: AC

## 2024-06-17 ENCOUNTER — Telehealth: Payer: Self-pay

## 2024-06-17 NOTE — Telephone Encounter (Signed)
 Follow-up after same day discharge: Implant date: 06/15/2024 MD: MCA Device: ICD Location: L chest   Wound check visit: 06/28/2024 90 day MD follow-up: 09/14/2024  Remote Transmission received:yes  Dressing/sling removed: n/a  Confirm OAC restart on: yes  Please continue to monitor your cardiac device site for redness, swelling, and drainage. Call the device clinic at [phone] if you experience these symptoms, fever/chills, or have questions about your device.   Remote monitoring is used to monitor your cardiac device from home. This monitoring is scheduled every 91 days by our office. It allows us  to keep an eye on the functioning of your device to ensure it is working properly.

## 2024-06-19 ENCOUNTER — Ambulatory Visit: Payer: Self-pay | Admitting: Student in an Organized Health Care Education/Training Program

## 2024-06-28 ENCOUNTER — Ambulatory Visit: Attending: Cardiology

## 2024-06-28 DIAGNOSIS — I5020 Unspecified systolic (congestive) heart failure: Secondary | ICD-10-CM | POA: Diagnosis present

## 2024-06-28 DIAGNOSIS — I428 Other cardiomyopathies: Secondary | ICD-10-CM | POA: Insufficient documentation

## 2024-06-28 LAB — CUP PACEART INCLINIC DEVICE CHECK
Battery Remaining Longevity: 69 mo
Brady Statistic RA Percent Paced: 0.45 %
Brady Statistic RV Percent Paced: 97 %
Date Time Interrogation Session: 20260120125302
HighPow Impedance: 43.377
Implantable Lead Connection Status: 753985
Implantable Lead Connection Status: 753985
Implantable Lead Connection Status: 753985
Implantable Lead Implant Date: 20101005
Implantable Lead Implant Date: 20101005
Implantable Lead Implant Date: 20101005
Implantable Lead Location: 753858
Implantable Lead Location: 753859
Implantable Lead Location: 753860
Implantable Lead Model: 1944
Implantable Pulse Generator Implant Date: 20260107
Lead Channel Impedance Value: 425 Ohm
Lead Channel Impedance Value: 462.5 Ohm
Lead Channel Impedance Value: 825 Ohm
Lead Channel Pacing Threshold Amplitude: 0.25 V
Lead Channel Pacing Threshold Amplitude: 0.5 V
Lead Channel Pacing Threshold Amplitude: 2.25 V
Lead Channel Pacing Threshold Pulse Width: 0.5 ms
Lead Channel Pacing Threshold Pulse Width: 0.5 ms
Lead Channel Pacing Threshold Pulse Width: 1 ms
Lead Channel Sensing Intrinsic Amplitude: 11.7 mV
Lead Channel Sensing Intrinsic Amplitude: 2.3 mV
Lead Channel Setting Pacing Amplitude: 1.5 V
Lead Channel Setting Pacing Amplitude: 2 V
Lead Channel Setting Pacing Amplitude: 3.25 V
Lead Channel Setting Pacing Pulse Width: 0.5 ms
Lead Channel Setting Pacing Pulse Width: 1 ms
Lead Channel Setting Sensing Sensitivity: 0.5 mV
Pulse Gen Serial Number: 211057463
Zone Setting Status: 755011

## 2024-06-28 NOTE — Progress Notes (Signed)
 Normal multi chamber ICD wound check. Wound well healed. Presenting rhythm: AS/BP 79. Routine testing performed. Thresholds, sensing, and impedance consistent with implant measurements. No treated arrhythmias. Reviewed shock plan. Pt enrolled in remote follow-up.  Programming Changes: - [employer]vue Threshold & Impedance Monitoring programmed ON.

## 2024-06-28 NOTE — Patient Instructions (Signed)
After Your ICD (Implantable Cardiac Defibrillator)    Monitor your defibrillator site for redness, swelling, and drainage. Call the device clinic at [phone] if you experience these symptoms or fever/chills.  Your incision was closed with Dermabond:  You may shower 1 day after your defibrillator implant and wash your incision with soap and water. Avoid lotions, ointments, or perfumes over your incision until it is well-healed.  You may use a hot tub or a pool after your wound check appointment if the incision is completely closed.   There are no restrictions in arm movement after your wound check appointment.   Your ICD is designed to protect you from life threatening heart rhythms. Because of this, you may receive a shock.   1 shock with no symptoms:  Call the office during business hours. 1 shock with symptoms (chest pain, chest pressure, dizziness, lightheadedness, shortness of breath, overall feeling unwell):  Call 911. If you experience 2 or more shocks in 24 hours:  Call 911. If you receive a shock, you should not drive.  [place] DMV - no driving for 6 months if you receive appropriate therapy from your ICD.   ICD Alerts:  Some alerts are vibratory and others beep. These are NOT emergencies. Please call our office to let us know. If this occurs at night or on weekends, it can wait until the next business day. Send a remote transmission.  If your device is capable of reading fluid status (for heart failure), you will be offered monthly monitoring to review this with you.   Remote monitoring is used to monitor your ICD from home. This monitoring is scheduled every 91 days by our office. It allows us to keep an eye on the functioning of your device to ensure it is working properly. You will routinely see your Electrophysiologist annually (more often if necessary).  

## 2024-07-10 ENCOUNTER — Ambulatory Visit: Payer: Self-pay | Admitting: Student in an Organized Health Care Education/Training Program

## 2024-07-16 ENCOUNTER — Ambulatory Visit

## 2024-07-18 ENCOUNTER — Encounter

## 2024-08-10 ENCOUNTER — Ambulatory Visit

## 2024-08-16 ENCOUNTER — Ambulatory Visit

## 2024-08-18 ENCOUNTER — Encounter

## 2024-09-14 ENCOUNTER — Ambulatory Visit: Admitting: Student

## 2024-11-09 ENCOUNTER — Ambulatory Visit

## 2024-11-11 ENCOUNTER — Ambulatory Visit: Admitting: Cardiovascular Disease
